# Patient Record
Sex: Female | Born: 1955 | Race: White | Hispanic: No | Marital: Married | Smoking: Former smoker
Health system: Southern US, Community
[De-identification: ages and names within clinical notes are randomized; demographics above are authoritative.]

## PROBLEM LIST (undated history)

## (undated) DIAGNOSIS — G4730 Sleep apnea, unspecified: Secondary | ICD-10-CM

## (undated) DIAGNOSIS — F32A Depression, unspecified: Secondary | ICD-10-CM

## (undated) DIAGNOSIS — M7071 Other bursitis of hip, right hip: Secondary | ICD-10-CM

## (undated) DIAGNOSIS — F329 Major depressive disorder, single episode, unspecified: Secondary | ICD-10-CM

## (undated) DIAGNOSIS — E079 Disorder of thyroid, unspecified: Secondary | ICD-10-CM

## (undated) DIAGNOSIS — M7072 Other bursitis of hip, left hip: Secondary | ICD-10-CM

## (undated) DIAGNOSIS — J45909 Unspecified asthma, uncomplicated: Secondary | ICD-10-CM

## (undated) DIAGNOSIS — A6920 Lyme disease, unspecified: Secondary | ICD-10-CM

## (undated) DIAGNOSIS — G4700 Insomnia, unspecified: Secondary | ICD-10-CM

## (undated) DIAGNOSIS — F419 Anxiety disorder, unspecified: Secondary | ICD-10-CM

## (undated) DIAGNOSIS — T7840XA Allergy, unspecified, initial encounter: Secondary | ICD-10-CM

## (undated) HISTORY — DX: Major depressive disorder, single episode, unspecified: F32.9

## (undated) HISTORY — DX: Allergy, unspecified, initial encounter: T78.40XA

## (undated) HISTORY — DX: Unspecified asthma, uncomplicated: J45.909

## (undated) HISTORY — DX: Insomnia, unspecified: G47.00

## (undated) HISTORY — DX: Depression, unspecified: F32.A

## (undated) HISTORY — DX: Disorder of thyroid, unspecified: E07.9

## (undated) HISTORY — DX: Other bursitis of hip, right hip: M70.72

## (undated) HISTORY — DX: Anxiety disorder, unspecified: F41.9

## (undated) HISTORY — DX: Lyme disease, unspecified: A69.20

## (undated) HISTORY — DX: Other bursitis of hip, right hip: M70.71

## (undated) HISTORY — DX: Sleep apnea, unspecified: G47.30

---

## 1997-06-21 HISTORY — PX: ABDOMINAL HYSTERECTOMY: SHX81

## 1997-06-21 HISTORY — PX: TOTAL ABDOMINAL HYSTERECTOMY: SHX209

## 2009-05-21 HISTORY — PX: SHOULDER SURGERY: SHX246

## 2013-08-24 LAB — HM MAMMOGRAPHY

## 2017-07-07 ENCOUNTER — Encounter: Payer: Self-pay | Admitting: *Deleted

## 2017-09-05 ENCOUNTER — Encounter: Payer: Self-pay | Admitting: Family Medicine

## 2017-09-05 ENCOUNTER — Ambulatory Visit (INDEPENDENT_AMBULATORY_CARE_PROVIDER_SITE_OTHER): Payer: Medicare HMO | Admitting: Family Medicine

## 2017-09-05 VITALS — BP 117/69 | HR 67 | Temp 98.4°F | Ht 64.0 in | Wt 157.4 lb

## 2017-09-05 DIAGNOSIS — Z7689 Persons encountering health services in other specified circumstances: Secondary | ICD-10-CM

## 2017-09-05 DIAGNOSIS — M7072 Other bursitis of hip, left hip: Secondary | ICD-10-CM

## 2017-09-05 DIAGNOSIS — G4700 Insomnia, unspecified: Secondary | ICD-10-CM | POA: Diagnosis not present

## 2017-09-05 DIAGNOSIS — E079 Disorder of thyroid, unspecified: Secondary | ICD-10-CM | POA: Diagnosis not present

## 2017-09-05 DIAGNOSIS — A6920 Lyme disease, unspecified: Secondary | ICD-10-CM

## 2017-09-05 DIAGNOSIS — M7071 Other bursitis of hip, right hip: Secondary | ICD-10-CM | POA: Diagnosis not present

## 2017-09-05 MED ORDER — PROGESTERONE MICRONIZED 100 MG PO CAPS: 100.0000 mg | ORAL_CAPSULE | Freq: Every day | ORAL | 6 refills | Status: DC

## 2017-09-05 MED ORDER — PROGESTERONE MICRONIZED 200 MG PO CAPS: 200.0000 mg | ORAL_CAPSULE | Freq: Every day | ORAL | 6 refills | Status: DC

## 2017-09-05 MED ORDER — DICLOFENAC SODIUM 50 MG PO TBEC: 50.0000 mg | DELAYED_RELEASE_TABLET | Freq: Two times a day (BID) | ORAL | 2 refills | Status: DC

## 2017-09-05 NOTE — Progress Notes (Signed)
BP 117/69 (BP Location: Left Arm, Patient Position: Sitting, Cuff Size: Normal)   Pulse 67   Temp 98.4 F (36.9 C) (Oral)   Ht 5\' 4"  (1.626 m)   Wt 157 lb 6.4 oz (71.4 kg)   SpO2 100%   BMI 27.02 kg/m    Subjective:    Patient ID: Veronica Mills, female    DOB: 1956-05-31, 62 y.o.   MRN: [id number]  HPI: Veronica FredericJoane Josey is a 62 y.o. female  Chief Complaint  Patient presents with  . Establish Care    From [place]. Needs PCP.  Marland Kitchen. Hip Pain    Bilateral, Hx of bursitis in hip   Pt here today to establish care. No new medical concerns today, just needing some refills on her medications.   Hx of B/l hip bursitis, has used diclofenac in the past for flares with good relief. Currently having a flare and would like a refill to help get things back under control. Intolerant to steroids/injections d/t chronic Lyme disease.   Has been stable on nature thyroid since 2012. TSH was last checked about 6 months ago she thinks.   Has severe night sweats, fairly controlled on estrogen and progesterone supplementation - taking 300 daily of progesterone orally as well as on two compounded creams - see below for formulation.   Compounded creams. Progesterone 6% cream and E2/E3 1.375/1.375 mg/mL. E twice daily 3 mL, progesterone 1 daily 5 mL.   Taking gabapentin for sleep issues/sweats along with some natural supplements and progesterone. 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   Unsure of when her last CPE was, will obtain records from [place] provider.   Past Medical History:  Diagnosis Date  . Bursitis of both hips   . Insomnia   . Lyme disease   . Thyroid disease    Social History   Socioeconomic History  . Marital status: Married    Spouse name: Not on file  . Number of children: Not on file  . Years of education: Not on file  . Highest education level: Not on file  Social Needs  . Financial resource strain: Not on file  . Food insecurity - worry: Not on file  . Food  insecurity - inability: Not on file  . Transportation needs - medical: Not on file  . Transportation needs - non-medical: Not on file  Occupational History  . Not on file  Tobacco Use  . Smoking status: Never Smoker  . Smokeless tobacco: Never Used  Substance and Sexual Activity  . Alcohol use: Yes    Frequency: Never    Comment: Occassionally  . Drug use: No  . Sexual activity: Not on file  Other Topics Concern  . Not on file  Social History Narrative  . Not on file   Relevant past medical, surgical, family and social history reviewed and updated as indicated. Interim medical history since our last visit reviewed. Allergies and medications reviewed and updated.  Review of Systems  Per HPI unless specifically indicated above     Objective:    BP 117/69 (BP Location: Left Arm, Patient Position: Sitting, Cuff Size: Normal)   Pulse 67   Temp 98.4 F (36.9 C) (Oral)   Ht 5\' 4"  (1.626 m)   Wt 157 lb 6.4 oz (71.4 kg)   SpO2 100%   BMI 27.02 kg/m   Wt Readings from Last 3 Encounters:  09/05/17 157 lb 6.4 oz (71.4 kg)    Physical  Exam  Constitutional: She is oriented to person, place, and time. She appears well-developed and well-nourished. No distress.  HENT:  Head: Atraumatic.  Eyes: Conjunctivae are normal. Pupils are equal, round, and reactive to light.  Neck: Normal range of motion. Neck supple.  Cardiovascular: Normal rate and normal heart sounds.  Pulmonary/Chest: Effort normal and breath sounds normal.  Musculoskeletal: Normal range of motion.  Neurological: She is alert and oriented to person, place, and time.  Skin: Skin is warm and dry.  Psychiatric: She has a normal mood and affect. Her behavior is normal.  Nursing note and vitals reviewed.   No results found for this or any previous visit.    Assessment & Plan:   Problem List Items Addressed This Visit      Endocrine   Thyroid disease    Will check TSH at upcoming appt. Continue Nature thyroid in  meantime      Relevant Medications   thyroid (NATURE-THROID) 32.5 MG tablet     Musculoskeletal and Integument   Bursitis of both hips    Restart diclofenac prn. Heat/ice as tolerated, stretches, epsom soaks.         Other   Insomnia - Primary    Stable on natural supplements, gabapentin, and compounded hormones. Refills given today      Lyme disease    Was followed by a Lyme Specialist in [place], feels things are at a stable point currently and not wanting to seek local specialist at this time.        Other Visit Diagnoses    Encounter to establish care           Follow up plan: Return for CPE.

## 2017-09-07 DIAGNOSIS — M7071 Other bursitis of hip, right hip: Secondary | ICD-10-CM | POA: Insufficient documentation

## 2017-09-07 DIAGNOSIS — E039 Hypothyroidism, unspecified: Secondary | ICD-10-CM | POA: Insufficient documentation

## 2017-09-07 DIAGNOSIS — F5104 Psychophysiologic insomnia: Secondary | ICD-10-CM | POA: Insufficient documentation

## 2017-09-07 DIAGNOSIS — A6920 Lyme disease, unspecified: Secondary | ICD-10-CM | POA: Insufficient documentation

## 2017-09-07 DIAGNOSIS — M7072 Other bursitis of hip, left hip: Secondary | ICD-10-CM

## 2017-09-07 NOTE — Assessment & Plan Note (Signed)
Was followed by a Lyme Specialist in [place], feels things are at a stable point currently and not wanting to seek local specialist at this time.

## 2017-09-07 NOTE — Assessment & Plan Note (Signed)
Will check TSH at upcoming appt. Continue Nature thyroid in meantime

## 2017-09-07 NOTE — Assessment & Plan Note (Signed)
Restart diclofenac prn. Heat/ice as tolerated, stretches, epsom soaks.

## 2017-09-07 NOTE — Patient Instructions (Signed)
Follow up for CPE 

## 2017-09-07 NOTE — Assessment & Plan Note (Signed)
Stable on natural supplements, gabapentin, and compounded hormones. Refills given today

## 2017-09-14 ENCOUNTER — Encounter: Payer: Medicare HMO | Admitting: Family Medicine

## 2017-10-12 ENCOUNTER — Telehealth: Payer: Self-pay | Admitting: Family Medicine

## 2017-10-12 NOTE — Telephone Encounter (Signed)
Patient notified

## 2017-10-12 NOTE — Telephone Encounter (Signed)
Yes, they have been received  Copied from CRM [phone]. Topic: Inquiry >> Oct 11, 2017  3:15 PM Maia Pettiesrtiz, Kristie S wrote: Reason for CRM: pt calling to get status update on records. She states the records from Dr. [place] ServiceJimmy Kline were supposed to be sent. Have these been received? Pt has appointment Thursday. Please call her to notify. >> Oct 12, 2017 10:00 AM Adela PortsWilliamson, Christan M wrote: Have you seen these records?

## 2017-10-13 ENCOUNTER — Encounter: Payer: Self-pay | Admitting: Family Medicine

## 2017-10-13 ENCOUNTER — Ambulatory Visit (INDEPENDENT_AMBULATORY_CARE_PROVIDER_SITE_OTHER): Payer: Medicare HMO | Admitting: Family Medicine

## 2017-10-13 VITALS — BP 120/65 | HR 70 | Temp 97.8°F | Ht 64.0 in | Wt 162.8 lb

## 2017-10-13 DIAGNOSIS — Z1159 Encounter for screening for other viral diseases: Secondary | ICD-10-CM

## 2017-10-13 DIAGNOSIS — A6920 Lyme disease, unspecified: Secondary | ICD-10-CM

## 2017-10-13 DIAGNOSIS — G4700 Insomnia, unspecified: Secondary | ICD-10-CM

## 2017-10-13 DIAGNOSIS — Z8639 Personal history of other endocrine, nutritional and metabolic disease: Secondary | ICD-10-CM

## 2017-10-13 DIAGNOSIS — Z1211 Encounter for screening for malignant neoplasm of colon: Secondary | ICD-10-CM

## 2017-10-13 DIAGNOSIS — Z0000 Encounter for general adult medical examination without abnormal findings: Secondary | ICD-10-CM

## 2017-10-13 DIAGNOSIS — Z13220 Encounter for screening for lipoid disorders: Secondary | ICD-10-CM | POA: Diagnosis not present

## 2017-10-13 DIAGNOSIS — Z131 Encounter for screening for diabetes mellitus: Secondary | ICD-10-CM | POA: Diagnosis not present

## 2017-10-13 DIAGNOSIS — Z0001 Encounter for general adult medical examination with abnormal findings: Secondary | ICD-10-CM | POA: Diagnosis not present

## 2017-10-13 DIAGNOSIS — E079 Disorder of thyroid, unspecified: Secondary | ICD-10-CM | POA: Diagnosis not present

## 2017-10-13 LAB — UA/M W/RFLX CULTURE, ROUTINE
BILIRUBIN UA: NEGATIVE
GLUCOSE, UA: NEGATIVE
KETONES UA: NEGATIVE
NITRITE UA: NEGATIVE
Protein, UA: NEGATIVE
RBC UA: NEGATIVE
SPEC GRAV UA: 1.02 (ref 1.005–1.030)
Urobilinogen, Ur: 0.2 mg/dL (ref 0.2–1.0)
pH, UA: 6.5 (ref 5.0–7.5)

## 2017-10-13 LAB — MICROSCOPIC EXAMINATION: Bacteria, UA: NONE SEEN

## 2017-10-13 MED ORDER — POLYMYXIN B-TRIMETHOPRIM 10000-0.1 UNIT/ML-% OP SOLN: 1.0000 [drp] | OPHTHALMIC | 0 refills | Status: DC

## 2017-10-13 NOTE — Assessment & Plan Note (Signed)
Still followed by Lyme specialist in [place]. Has upcoming appt in May

## 2017-10-13 NOTE — Progress Notes (Signed)
Subjective:    Veronica Mills is a 62 y.o. female who presents for a Welcome to Medicare exam.   Review of Systems Review of Systems  Constitutional: Negative.   HENT: Negative.   Eyes: Negative.   Respiratory: Negative.   Cardiovascular: Negative.   Gastrointestinal: Negative.   Genitourinary: Negative.   Musculoskeletal: Negative.   Skin: Negative.   Neurological: Negative.   Psychiatric/Behavioral: Positive for depression.      Left eye draining and itching, happens cyclicly. No decreased vision, fevers, headaches, N/V.   Compounding pharmacy in [place] for nature thyroid - Programme researcher, broadcasting/film/video of cary. Can only have this type due to a corn allergy.  Seeing Lyme dz specialist next month in [place].   Hx of hysterectomy, no cervix confirmed at last pap several years ago. Does not want mammogram, wants cologuard.       Objective:    Today's Vitals   10/13/17 1333  BP: 120/65  Pulse: 70  Temp: 97.8 F (36.6 C)  TempSrc: Oral  SpO2: 99%  Weight: 162 lb 12.8 oz (73.8 kg)  Height: 5\' 4"  (1.626 m)  Body mass index is 27.94 kg/m.  Medications Outpatient Encounter Medications as of 10/13/2017  Medication Sig  . diclofenac (VOLTAREN) 50 MG EC tablet Take 1 tablet (50 mg total) by mouth 2 (two) times daily.  . Estradiol-Estriol-Progesterone (BIEST/PROGESTERONE TD) Apply topically.  . gabapentin (NEURONTIN) 600 MG tablet Take 1,200 mg by mouth at bedtime.  . Misc Natural Products (PROGESTERONE EX) Apply 6 % topically.  . progesterone (PROMETRIUM) 100 MG capsule Take 1 capsule (100 mg total) by mouth daily.  . progesterone (PROMETRIUM) 200 MG capsule Take 1 capsule (200 mg total) by mouth daily.  . [DISCONTINUED] gabapentin (NEURONTIN) 300 MG capsule Take 600 mg by mouth daily.  . [DISCONTINUED] thyroid (NATURE-THROID) 32.5 MG tablet Take 32.5 mg by mouth daily.  Marland Kitchen trimethoprim-polymyxin b (POLYTRIM) ophthalmic solution Place 1 drop into the left eye every 4 (four)  hours.   No facility-administered encounter medications on file as of 10/13/2017.      History: Past Medical History:  Diagnosis Date  . Bursitis of both hips   . Insomnia   . Lyme disease   . Thyroid disease    Past Surgical History:  Procedure Laterality Date  . ABDOMINAL HYSTERECTOMY  06/21/1997  . SHOULDER SURGERY Left 05/2009    Family History  Problem Relation Age of Onset  . Mental retardation Mother   . Stroke Father   . Dementia Father    Social History   Occupational History  . Not on file  Tobacco Use  . Smoking status: Never Smoker  . Smokeless tobacco: Never Used  Substance and Sexual Activity  . Alcohol use: Yes    Frequency: Never    Comment: Occassionally  . Drug use: No  . Sexual activity: Not on file    Tobacco Counseling Counseling given: Not Answered   Immunizations and Health Maintenance  There is no immunization history on file for this patient. There are no preventive care reminders to display for this patient.  Activities of Daily Living In your present state of health, do you have any difficulty performing the following activities: 10/13/2017  Hearing? N  Vision? N  Difficulty concentrating or making decisions? N  Walking or climbing stairs? Y  Comment Sometimes-Hips  Dressing or bathing? N  Doing errands, shopping? N    Physical Exam  Physical Exam  Constitutional: She is oriented to person, place,  and time and well-developed, well-nourished, and in no distress. No distress.  HENT:  Head: Atraumatic.  Right Ear: External ear normal.  Left Ear: External ear normal.  Nose: Nose normal.  Mouth/Throat: Oropharynx is clear and moist.  Eyes: Pupils are equal, round, and reactive to light. Conjunctivae are normal.  Neck: Normal range of motion. Neck supple.  Cardiovascular: Normal rate, regular rhythm and normal heart sounds.  Pulmonary/Chest: Effort normal and breath sounds normal. No respiratory distress. She has no wheezes.    Abdominal: Soft. Bowel sounds are normal. There is no tenderness.  Musculoskeletal: Normal range of motion.  Neurological: She is alert and oriented to person, place, and time. No cranial nerve deficit.  Skin: Skin is warm and dry.  Psychiatric: Affect and judgment normal.  Nursing note and vitals reviewed. (optional), or other factors deemed appropriate based on the beneficiary's medical and social history and current clinical standards.  Advanced Directives: A voluntary discussion about advance care planning including the explanation and discussion of advance directives was extensively discussed  with the patient.  Explanation about the health care proxy and Living will was reviewed and packet with forms with explanation of how to fill them out was given.  During this discussion, the patient does not plan to fill out the paperwork required today but will consider what her decisions would be.  Patient was offered a separate Advance Care Planning visit for further assistance with forms.  Time spent:   5 min     Individuals present: pt, provider       Assessment:    This is a routine wellness examination for this patient .   Vision/Hearing screen  Hearing Screening   125Hz  250Hz  500Hz  1000Hz  2000Hz  3000Hz  4000Hz  6000Hz  8000Hz   Right ear:     40  40    Left ear:   25 25 20  25       Visual Acuity Screening   Right eye Left eye Both eyes  Without correction:     With correction: 20/30 20/20 20/20     Dietary issues and exercise activities discussed:     Goals    None     Depression Screen PHQ 2/9 Scores 10/13/2017  PHQ - 2 Score 0      Fall Risk Fall Risk  10/13/2017  Falls in the past year? No    Cognitive Function: 6CIT Screen 10/16/2017  What Year? 0 points  What month? 0 points  What time? 0 points  Count back from 20 0 points  Months in reverse 0 points  Repeat phrase 0 points  Total Score 0          Patient Care Team: Particia Nearing, PA-C as PCP -  General (Family Medicine)     Plan:    1. Encounter for Medicare annual wellness exam EKG today NSR at 64 bpm, await basic labs and screening results - EKG 12-Lead - Hepatitis C antibody screen - Lipid panel - Hemoglobin A1c - Basic metabolic panel - UA/M w/rflx Culture, Routine  2. Thyroid disease Recheck TSH, order compounded nature thyroid - TSH  3. Need for hepatitis C screening test Await results  4. Screening for colon cancer Cologuard ordered - Cologuard  5. Lyme disease Sees Lyme specialist, defer to their tx  6. Insomnia, unspecified type Stable on gabapentin and HRT  7. History of thyroid nodule Will repeat u/s per pt request, she will send previous imaging records - US THYROID; Future  I have personally reviewed  and noted the following in the patient's chart:   . Medical and social history . Use of alcohol, tobacco or illicit drugs  . Current medications and supplements . Functional ability and status . Nutritional status . Physical activity . Advanced directives . List of other physicians . Hospitalizations, surgeries, and ER visits in previous 12 months . Vitals . Screenings to include cognitive, depression, and falls . Referrals and appointments  In addition, I have reviewed and discussed with patient certain preventive protocols, quality metrics, and best practice recommendations. A written personalized care plan for preventive services as well as general preventive health recommendations were provided to patient.     Particia NearingRachel Elizabeth Doninique Lwin, [place] JerseyPA-C 10/16/2017

## 2017-10-13 NOTE — Assessment & Plan Note (Signed)
Recheck TSH today, continue nature thyroid. Will adjust dose if needed

## 2017-10-13 NOTE — Patient Instructions (Addendum)
[facility] in [facility] Maintenance, Female Adopting a healthy lifestyle and getting preventive care can go a long way to promote health and wellness. Talk with your health care provider about what schedule of regular examinations is right for you. This is a good chance for you to check in with your provider about disease prevention and staying healthy. In between checkups, there are plenty of things you can do on your own. Experts have done a lot of research about which lifestyle changes and preventive measures are most likely to keep you healthy. Ask your health care provider for more information. Weight and diet Eat a healthy diet  Be sure to include plenty of vegetables, fruits, low-fat dairy products, and lean protein.  Do not eat a lot of foods high in solid fats, added sugars, or salt.  Get regular exercise. This is one of the most important things you can do for your health. ? Most adults should exercise for at least 150 minutes each week. The exercise should increase your heart rate and make you sweat (moderate-intensity exercise). ? Most adults should also do strengthening exercises at least twice a week. This is in addition to the moderate-intensity exercise.  Maintain a healthy weight  Body mass index (BMI) is a measurement that can be used to identify possible weight problems. It estimates body fat based on height and weight. Your health care provider can help determine your BMI and help you achieve or maintain a healthy weight.  For females 92 years of age and older: ? A BMI below 18.5 is considered underweight. ? A BMI of 18.5 to 24.9 is normal. ? A BMI of 25 to 29.9 is considered overweight. ? A BMI of 30 and above is considered obese.  Watch levels of cholesterol and blood lipids  You should start having your blood tested for lipids and cholesterol at 60 years of age, then have this test every 5 years.  You may need to have your cholesterol levels  checked more often if: ? Your lipid or cholesterol levels are high. ? You are older than 60 years of age. ? You are at high risk for heart disease.  Cancer screening Lung Cancer  Lung cancer screening is recommended for adults 4-27 years old who are at high risk for lung cancer because of a history of smoking.  A yearly low-dose CT scan of the lungs is recommended for people who: ? Currently smoke. ? Have quit within the past 15 years. ? Have at least a 30-pack-year history of smoking. A pack year is smoking an average of one pack of cigarettes a day for 1 year.  Yearly screening should continue until it has been 15 years since you quit.  Yearly screening should stop if you develop a health problem that would prevent you from having lung cancer treatment.  Breast Cancer  Practice breast self-awareness. This means understanding how your breasts normally appear and feel.  It also means doing regular breast self-exams. Let your health care provider know about any changes, no matter how small.  If you are in your 20s or 30s, you should have a clinical breast exam (CBE) by a health care provider every 1-3 years as part of a regular health exam.  If you are 67 or older, have a CBE every year. Also consider having a breast X-ray (mammogram) every year.  If you have a family history of breast cancer, talk to your health care provider about genetic  screening.  If you are at high risk for breast cancer, talk to your health care provider about having an MRI and a mammogram every year.  Breast cancer gene (BRCA) assessment is recommended for women who have family members with BRCA-related cancers. BRCA-related cancers include: ? Breast. ? Ovarian. ? Tubal. ? Peritoneal cancers.  Results of the assessment will determine the need for genetic counseling and BRCA1 and BRCA2 testing.  Cervical Cancer Your health care provider may recommend that you be screened regularly for cancer of the  pelvic organs (ovaries, uterus, and vagina). This screening involves a pelvic examination, including checking for microscopic changes to the surface of your cervix (Pap test). You may be encouraged to have this screening done every 3 years, beginning at age 21.  For women ages 30-65, health care providers may recommend pelvic exams and Pap testing every 3 years, or they may recommend the Pap and pelvic exam, combined with testing for human papilloma virus (HPV), every 5 years. Some types of HPV increase your risk of cervical cancer. Testing for HPV may also be done on women of any age with unclear Pap test results.  Other health care providers may not recommend any screening for nonpregnant women who are considered low risk for pelvic cancer and who do not have symptoms. Ask your health care provider if a screening pelvic exam is right for you.  If you have had past treatment for cervical cancer or a condition that could lead to cancer, you need Pap tests and screening for cancer for at least 20 years after your treatment. If Pap tests have been discontinued, your risk factors (such as having a new sexual partner) need to be reassessed to determine if screening should resume. Some women have medical problems that increase the chance of getting cervical cancer. In these cases, your health care provider may recommend more frequent screening and Pap tests.  Colorectal Cancer  This type of cancer can be detected and often prevented.  Routine colorectal cancer screening usually begins at 60 years of age and continues through 60 years of age.  Your health care provider may recommend screening at an earlier age if you have risk factors for colon cancer.  Your health care provider may also recommend using home test kits to check for hidden blood in the stool.  A small camera at the end of a tube can be used to examine your colon directly (sigmoidoscopy or colonoscopy). This is done to check for the  earliest forms of colorectal cancer.  Routine screening usually begins at age 50.  Direct examination of the colon should be repeated every 5-10 years through 60 years of age. However, you may need to be screened more often if early forms of precancerous polyps or small growths are found.  Skin Cancer  Check your skin from head to toe regularly.  Tell your health care provider about any new moles or changes in moles, especially if there is a change in a mole's shape or color.  Also tell your health care provider if you have a mole that is larger than the size of a pencil eraser.  Always use sunscreen. Apply sunscreen liberally and repeatedly throughout the day.  Protect yourself by wearing long sleeves, pants, a wide-brimmed hat, and sunglasses whenever you are outside.  Heart disease, diabetes, and high blood pressure  High blood pressure causes heart disease and increases the risk of stroke. High blood pressure is more likely to develop in: ? People who   have blood pressure in the high end of the normal range (130-139/85-89 mm Hg). ? People who are overweight or obese. ? People who are African American.  If you are 19-29 years of age, have your blood pressure checked every 3-5 years. If you are 30 years of age or older, have your blood pressure checked every year. You should have your blood pressure measured twice-once when you are at a hospital or clinic, and once when you are not at a hospital or clinic. Record the average of the two measurements. To check your blood pressure when you are not at a hospital or clinic, you can use: ? An automated blood pressure machine at a pharmacy. ? A home blood pressure monitor.  If you are between 65 years and 63 years old, ask your health care provider if you should take aspirin to prevent strokes.  Have regular diabetes screenings. This involves taking a blood sample to check your fasting blood sugar level. ? If you are at a normal weight and  have a low risk for diabetes, have this test once every three years after 60 years of age. ? If you are overweight and have a high risk for diabetes, consider being tested at a younger age or more often. Preventing infection Hepatitis B  If you have a higher risk for hepatitis B, you should be screened for this virus. You are considered at high risk for hepatitis B if: ? You were born in a country where hepatitis B is common. Ask your health care provider which countries are considered high risk. ? Your parents were born in a high-risk country, and you have not been immunized against hepatitis B (hepatitis B vaccine). ? You have HIV or AIDS. ? You use needles to inject street drugs. ? You live with someone who has hepatitis B. ? You have had sex with someone who has hepatitis B. ? You get hemodialysis treatment. ? You take certain medicines for conditions, including cancer, organ transplantation, and autoimmune conditions.  Hepatitis C  Blood testing is recommended for: ? Everyone born from [birth year] through 1963. ? Anyone with known risk factors for hepatitis C.  Sexually transmitted infections (STIs)  You should be screened for sexually transmitted infections (STIs) including gonorrhea and chlamydia if: ? You are sexually active and are younger than 60 years of age. ? You are older than 60 years of age and your health care provider tells you that you are at risk for this type of infection. ? Your sexual activity has changed since you were last screened and you are at an increased risk for chlamydia or gonorrhea. Ask your health care provider if you are at risk.  If you do not have HIV, but are at risk, it may be recommended that you take a prescription medicine daily to prevent HIV infection. This is called pre-exposure prophylaxis (PrEP). You are considered at risk if: ? You are sexually active and do not regularly use condoms or know the HIV status of your partner(s). ? You take drugs by  injection. ? You are sexually active with a partner who has HIV.  Talk with your health care provider about whether you are at high risk of being infected with HIV. If you choose to begin PrEP, you should first be tested for HIV. You should then be tested every 3 months for as long as you are taking PrEP. Pregnancy  If you are premenopausal and you may become pregnant, ask your health care provider  about preconception counseling.  If you may become pregnant, take 400 to 800 micrograms (mcg) of folic acid every day.  If you want to prevent pregnancy, talk to your health care provider about birth control (contraception). Osteoporosis and menopause  Osteoporosis is a disease in which the bones lose minerals and strength with aging. This can result in serious bone fractures. Your risk for osteoporosis can be identified using a bone density scan.  If you are 6 years of age or older, or if you are at risk for osteoporosis and fractures, ask your health care provider if you should be screened.  Ask your health care provider whether you should take a calcium or vitamin D supplement to lower your risk for osteoporosis.  Menopause may have certain physical symptoms and risks.  Hormone replacement therapy may reduce some of these symptoms and risks. Talk to your health care provider about whether hormone replacement therapy is right for you. Follow these instructions at home:  Schedule regular health, dental, and eye exams.  Stay current with your immunizations.  Do not use any tobacco products including cigarettes, chewing tobacco, or electronic cigarettes.  If you are pregnant, do not drink alcohol.  If you are breastfeeding, limit how much and how often you drink alcohol.  Limit alcohol intake to no more than 1 drink per day for nonpregnant women. One drink equals 12 ounces of beer, 5 ounces of wine, or 1 ounces of hard liquor.  Do not use street drugs.  Do not share needles.  Ask  your health care provider for help if you need support or information about quitting drugs.  Tell your health care provider if you often feel depressed.  Tell your health care provider if you have ever been abused or do not feel safe at home. This information is not intended to replace advice given to you by your health care provider. Make sure you discuss any questions you have with your health care provider. Document Released: 12/21/2010 Document Revised: 11/13/2015 Document Reviewed: 03/11/2015 Elsevier Interactive Patient Education  [employer].

## 2017-10-13 NOTE — Assessment & Plan Note (Signed)
Under good control with gabapentin and hormone replacement. Continue current regimen

## 2017-10-14 ENCOUNTER — Telehealth: Payer: Self-pay | Admitting: Family Medicine

## 2017-10-14 ENCOUNTER — Other Ambulatory Visit: Payer: Self-pay | Admitting: Family Medicine

## 2017-10-14 DIAGNOSIS — R5381 Other malaise: Secondary | ICD-10-CM | POA: Diagnosis not present

## 2017-10-14 DIAGNOSIS — M2550 Pain in unspecified joint: Secondary | ICD-10-CM | POA: Diagnosis not present

## 2017-10-14 DIAGNOSIS — G3189 Other specified degenerative diseases of nervous system: Secondary | ICD-10-CM | POA: Diagnosis not present

## 2017-10-14 DIAGNOSIS — A6920 Lyme disease, unspecified: Secondary | ICD-10-CM | POA: Diagnosis not present

## 2017-10-14 LAB — BASIC METABOLIC PANEL
BUN / CREAT RATIO: 32 — AB (ref 9–23)
BUN: 19 mg/dL (ref 6–24)
CO2: 26 mmol/L (ref 20–29)
CREATININE: 0.6 mg/dL (ref 0.57–1.00)
Calcium: 9.8 mg/dL (ref 8.7–10.2)
Chloride: 95 mmol/L — ABNORMAL LOW (ref 96–106)
GFR calc Af Amer: 115 mL/min/{1.73_m2} (ref >59)
GFR, EST NON AFRICAN AMERICAN: 100 mL/min/{1.73_m2} (ref >59)
GLUCOSE: 126 mg/dL — AB (ref 65–99)
Potassium: 3.8 mmol/L (ref 3.5–5.2)
Sodium: 141 mmol/L (ref 134–144)

## 2017-10-14 LAB — HEPATITIS C ANTIBODY: Hep C Virus Ab: <0.1 s/co ratio (ref 0.0–0.9)

## 2017-10-14 LAB — HEMOGLOBIN A1C
Est. average glucose Bld gHb Est-mCnc: 117 mg/dL
Hgb A1c MFr Bld: 5.7 % — ABNORMAL HIGH (ref 4.8–5.6)

## 2017-10-14 LAB — LIPID PANEL
CHOLESTEROL TOTAL: 222 mg/dL — AB (ref 100–199)
Chol/HDL Ratio: 4.1 ratio (ref 0.0–4.4)
HDL: 54 mg/dL (ref >39)
LDL CALC: 93 mg/dL (ref 0–99)
TRIGLYCERIDES: 375 mg/dL — AB (ref 0–149)
VLDL Cholesterol Cal: 75 mg/dL — ABNORMAL HIGH (ref 5–40)

## 2017-10-14 LAB — TSH: TSH: 1.56 u[IU]/mL (ref 0.450–4.500)

## 2017-10-14 MED ORDER — THYROID 32.5 MG PO TABS: 32.5000 mg | ORAL_TABLET | Freq: Every day | ORAL | 3 refills | Status: DC

## 2017-10-14 NOTE — Telephone Encounter (Signed)
Nature thyroid script printed, can you please fax it to [facility] in [place], [place] per pt request?

## 2017-10-14 NOTE — Telephone Encounter (Signed)
Rx faxed

## 2017-10-18 ENCOUNTER — Ambulatory Visit: Payer: Medicare HMO

## 2017-10-20 ENCOUNTER — Ambulatory Visit
Admission: RE | Admit: 2017-10-20 | Discharge: 2017-10-20 | Disposition: A | Discharge status: Home / Self Care | Payer: Medicare HMO | Source: Ambulatory Visit | Attending: Family Medicine | Admitting: Family Medicine

## 2017-10-20 DIAGNOSIS — E041 Nontoxic single thyroid nodule: Secondary | ICD-10-CM | POA: Diagnosis not present

## 2017-10-20 DIAGNOSIS — Z8639 Personal history of other endocrine, nutritional and metabolic disease: Secondary | ICD-10-CM

## 2017-11-11 ENCOUNTER — Other Ambulatory Visit: Payer: Self-pay | Admitting: Family Medicine

## 2017-11-25 ENCOUNTER — Telehealth: Payer: Self-pay | Admitting: Family Medicine

## 2017-11-25 NOTE — Telephone Encounter (Signed)
Copied from CRM [phone]. Topic: General - Other >> Nov 25, 2017  3:00 PM Gerrianne ScalePayne, Angela L wrote: Reason for CRM: Judeth CornfieldStephanie from [facility] [phone] calling stating that the pt states that she is suppose to take two of the gabapentin (NEURONTIN) 600 MG tablet please give them a call back

## 2017-11-28 NOTE — Telephone Encounter (Signed)
Pharmacy stated that patient brought in written Rx by Fleet Contrasachel for 2 600mg  Gabapentin QHS. Pharmacy was confirming that it was 1200 at night. Explained we didn't have the Rx on file for 600mg  [facility], but we have 1200 in our system. Pharmacy understood and said they'd fill the 1200 at night.

## 2017-11-28 NOTE — Telephone Encounter (Signed)
Perfect. Thanks.

## 2017-11-28 NOTE — Telephone Encounter (Signed)
It doesn't look like we have written her gabapentin. It appears that she told us she was taking 1200mg  at bedtime

## 2017-12-02 ENCOUNTER — Encounter: Payer: Self-pay | Admitting: Family Medicine

## 2017-12-02 DIAGNOSIS — H1032 Unspecified acute conjunctivitis, left eye: Secondary | ICD-10-CM | POA: Diagnosis not present

## 2017-12-02 LAB — HM DIABETES EYE EXAM

## 2017-12-14 ENCOUNTER — Other Ambulatory Visit: Payer: Self-pay | Admitting: Family Medicine

## 2017-12-16 NOTE — Telephone Encounter (Signed)
Interface refill request  For Voltaren  # 60.  50mg .    Pharmacy:  [facility] [address]  LOV 10/13/17 with [facility]ane[facility]   Last refill:11/15/17

## 2017-12-29 ENCOUNTER — Other Ambulatory Visit: Payer: Self-pay

## 2017-12-29 NOTE — Telephone Encounter (Signed)
90 day supply request for Diclofenac Tablet.  Last visit 10/03/2017

## 2017-12-30 MED ORDER — DICLOFENAC SODIUM 50 MG PO TBEC: 50.0000 mg | DELAYED_RELEASE_TABLET | Freq: Two times a day (BID) | ORAL | 1 refills | Status: DC

## 2017-12-30 NOTE — Telephone Encounter (Signed)
RX sent

## 2018-01-30 ENCOUNTER — Encounter: Payer: Self-pay | Admitting: Family Medicine

## 2018-01-30 ENCOUNTER — Ambulatory Visit (INDEPENDENT_AMBULATORY_CARE_PROVIDER_SITE_OTHER): Payer: Medicare HMO | Admitting: Family Medicine

## 2018-01-30 VITALS — BP 132/69 | HR 60 | Temp 98.1°F | Ht 64.0 in | Wt 164.3 lb

## 2018-01-30 DIAGNOSIS — H1032 Unspecified acute conjunctivitis, left eye: Secondary | ICD-10-CM | POA: Diagnosis not present

## 2018-01-30 MED ORDER — SULFAMETHOXAZOLE-TRIMETHOPRIM 800-160 MG PO TABS: 1.0000 | ORAL_TABLET | Freq: Two times a day (BID) | ORAL | 0 refills | Status: DC

## 2018-01-30 NOTE — Progress Notes (Signed)
   BP 132/69 (BP Location: Left Arm, Patient Position: Sitting, Cuff Size: Normal)   Pulse 60   Temp 98.1 F (36.7 C) (Oral)   Ht 5\' 4"  (1.626 m)   Wt 164 lb 4.8 oz (74.5 kg)   SpO2 98%   BMI 28.20 kg/m    Subjective:    Patient ID: Veronica Mills, female    DOB: [birth year], 62 y.o.   MRN: [id number]  HPI: Veronica Mills is a 62 y.o. female  Chief Complaint  Patient presents with  . Eye Drainage    Left eye. Burning, itching and redness. Ongoing for 4 days. No relief with eye drops.   4 days of left eye irritation, drainage, redness, itching. Doing polytrim drops and antihistamines, compresses with no relief. Now also 2 days of surrounding edema and erythema of under eye area. Denies fevers, chills, visual changes, headaches, N/V. Has had eye issues like this intermittently over the years but this one feels a bit worse than usual.   Relevant past medical, surgical, family and social history reviewed and updated as indicated. Interim medical history since our last visit reviewed. Allergies and medications reviewed and updated.  Review of Systems  Per HPI unless specifically indicated above     Objective:    BP 132/69 (BP Location: Left Arm, Patient Position: Sitting, Cuff Size: Normal)   Pulse 60   Temp 98.1 F (36.7 C) (Oral)   Ht 5\' 4"  (1.626 m)   Wt 164 lb 4.8 oz (74.5 kg)   SpO2 98%   BMI 28.20 kg/m   Wt Readings from Last 3 Encounters:  01/30/18 164 lb 4.8 oz (74.5 kg)  10/13/17 162 lb 12.8 oz (73.8 kg)  09/05/17 157 lb 6.4 oz (71.4 kg)    Physical Exam  Constitutional: She is oriented to person, place, and time. She appears well-developed and well-nourished. No distress.  HENT:  Head: Atraumatic.  Eyes: Pupils are equal, round, and reactive to light. EOM are normal. Left eye exhibits discharge.  Left conjunctiva erythematous with thick drainage and surrounding erythema starting under eye. TTP over erythematous area  Neck: Normal range of motion. Neck supple.    Cardiovascular: Normal rate and regular rhythm.  Pulmonary/Chest: Effort normal and breath sounds normal.  Neurological: She is alert and oriented to person, place, and time.  Skin: Skin is warm and dry.  Psychiatric: She has a normal mood and affect. Her behavior is normal.  Nursing note and vitals reviewed.  Declines visual acuity testing as she states her vision appears intact  Results for orders placed or performed in visit on 12/05/17  HM DIABETES EYE EXAM  Result Value Ref Range   HM Diabetic Eye Exam No Retinopathy No Retinopathy      Assessment & Plan:   Problem List Items Addressed This Visit    None    Visit Diagnoses    Acute bacterial conjunctivitis of left eye    -  Primary   Will add oral bactrim and continue polytrim drops. Compresses, OTC pain relievers. Strict return precautions reviewed   Relevant Medications   sulfamethoxazole-trimethoprim (BACTRIM DS,SEPTRA DS) 800-160 MG tablet       Follow up plan: Return for as scheduled.

## 2018-02-02 ENCOUNTER — Other Ambulatory Visit: Payer: Self-pay | Admitting: Family Medicine

## 2018-02-02 NOTE — Telephone Encounter (Signed)
Correction: Last office visit 01/30/2018 for an acute visit.

## 2018-02-02 NOTE — Patient Instructions (Signed)
Follow up as scheduled.  

## 2018-02-02 NOTE — Telephone Encounter (Signed)
Progesterone 200 mg capsule refill Last Refill:09/05/17 # 30 Last OV: 09/05/17 PCP: Roosvelt Maserachel Lane PA [facility]acy:[facility] [address]   Progesterone 100 mg refill Last Refill:09/05/17 # 30 Last OV: 09/05/17

## 2018-02-03 DIAGNOSIS — H10503 Unspecified blepharoconjunctivitis, bilateral: Secondary | ICD-10-CM | POA: Diagnosis not present

## 2018-07-25 ENCOUNTER — Encounter: Payer: Self-pay | Admitting: Family Medicine

## 2018-07-25 ENCOUNTER — Ambulatory Visit (INDEPENDENT_AMBULATORY_CARE_PROVIDER_SITE_OTHER): Payer: Medicare HMO | Admitting: Family Medicine

## 2018-07-25 VITALS — BP 117/76 | HR 71 | Temp 99.0°F | Ht 64.0 in | Wt 170.1 lb

## 2018-07-25 DIAGNOSIS — E785 Hyperlipidemia, unspecified: Secondary | ICD-10-CM | POA: Diagnosis not present

## 2018-07-25 DIAGNOSIS — A6920 Lyme disease, unspecified: Secondary | ICD-10-CM

## 2018-07-25 DIAGNOSIS — R7301 Impaired fasting glucose: Secondary | ICD-10-CM | POA: Diagnosis not present

## 2018-07-25 DIAGNOSIS — M2550 Pain in unspecified joint: Secondary | ICD-10-CM | POA: Diagnosis not present

## 2018-07-25 DIAGNOSIS — E039 Hypothyroidism, unspecified: Secondary | ICD-10-CM

## 2018-07-25 DIAGNOSIS — R5383 Other fatigue: Secondary | ICD-10-CM | POA: Diagnosis not present

## 2018-07-25 NOTE — Progress Notes (Signed)
BP 117/76 (BP Location: Left Arm, Patient Position: Sitting, Cuff Size: Normal)   Pulse 71   Temp 99 F (37.2 C) (Oral)   Ht _0  (1.626 m)   Wt 170 lb 1.6 oz (77.2 kg)   SpO2 98%   BMI 29.20 kg/m    Subjective:    Patient ID: Veronica Mills, female    DOB: 09-Aug-1955, 63 y.o.   MRN: [id number]  HPI: Veronica Mills is a 63 y.o. female  Chief Complaint  Patient presents with  . Weight Gain    Ongoing for about 3 months. Patient is wondering if it has to do with her thyroid.   . Muscle Pain  . Knee Pain  . Fatigue  . Headache  . Depression   Weight gain, fatigue, muscle pains, joint pains in b/l knees, lack of motivation, insomnia, headaches at temples, night sweats. No fevers, CP, SOB, N/V/D. Does have chronic lyme disease. Seeing Lyme specialist as needed. No recent travel outside the country, hx of autoimmune dz, tick bites, sick contacts, bleeding or bruising issues. Does feel like her moods are becoming more of an issue and wondering if this is playing a part. Does have known hypothyroidism that has been well controled with nature thyroid for years.   Moods - Was on cymbalta in the past and had a really hard time coming off of that and does not want to be on any at this time. Has seen her daughter struggle with them as well. Denies SI/HI.   Also due for cholesterol and glucose rechecks. Very concerned about these levels and her recent weight gain despite not making much change to lifestyle. Currently diet controlled for both.   Past Medical History:  Diagnosis Date  . Bursitis of both hips   . Insomnia   . Lyme disease   . Thyroid disease    Social History   Socioeconomic History  . Marital status: Married    Spouse name: Not on file  . Number of children: Not on file  . Years of education: Not on file  . Highest education level: Not on file  Occupational History  . Not on file  Social Needs  . Financial resource strain: Not on file  . Food insecurity:   Worry: Not on file    Inability: Not on file  . Transportation needs:    Medical: Not on file    Non-medical: Not on file  Tobacco Use  . Smoking status: Never Smoker  . Smokeless tobacco: Never Used  Substance and Sexual Activity  . Alcohol use: Yes    Frequency: Never    Comment: Occassionally  . Drug use: No  . Sexual activity: Not on file  Lifestyle  . Physical activity:    Days per week: Not on file    Minutes per session: Not on file  . Stress: Not on file  Relationships  . Social connections:    Talks on phone: Not on file    Gets together: Not on file    Attends religious service: Not on file    Active member of club or organization: Not on file    Attends meetings of clubs or organizations: Not on file    Relationship status: Not on file  . Intimate partner violence:    Fear of current or ex partner: Not on file    Emotionally abused: Not on file    Physically abused: Not on file    Forced sexual activity: Not on  file  Other Topics Concern  . Not on file  Social History Narrative  . Not on file    Relevant past medical, surgical, family and social history reviewed and updated as indicated. Interim medical history since our last visit reviewed. Allergies and medications reviewed and updated.  Review of Systems  Per HPI unless specifically indicated above     Objective:    BP 117/76 (BP Location: Left Arm, Patient Position: Sitting, Cuff Size: Normal)   Pulse 71   Temp 99 F (37.2 C) (Oral)   Ht '5\' 4"'$  (1.626 m)   Wt 170 lb 1.6 oz (77.2 kg)   SpO2 98%   BMI 29.20 kg/m   Wt Readings from Last 3 Encounters:  07/25/18 170 lb 1.6 oz (77.2 kg)  01/30/18 164 lb 4.8 oz (74.5 kg)  10/13/17 162 lb 12.8 oz (73.8 kg)    Physical Exam Vitals signs and nursing note reviewed.  Constitutional:      Appearance: Normal appearance. She is not ill-appearing.  HENT:     Head: Atraumatic.  Eyes:     Extraocular Movements: Extraocular movements intact.      Conjunctiva/sclera: Conjunctivae normal.  Neck:     Musculoskeletal: Normal range of motion and neck supple.  Cardiovascular:     Rate and Rhythm: Normal rate and regular rhythm.     Heart sounds: Normal heart sounds.  Pulmonary:     Effort: Pulmonary effort is normal.     Breath sounds: Normal breath sounds.  Musculoskeletal: Normal range of motion.  Skin:    General: Skin is warm and dry.  Neurological:     Mental Status: She is alert and oriented to person, place, and time.  Psychiatric:        Mood and Affect: Mood normal.        Thought Content: Thought content normal.        Judgment: Judgment normal.     Results for orders placed or performed in visit on 07/25/18  HNK1 (CD57) Panel  Result Value Ref Range   % CD8-/CD57+ Lymphs 8.0 2.0 - 17.0 %   Abs.CD8-CD57+ Lymphs 160 60 - 360 /uL   WBC 7.2 3.4 - 10.8 x10E3/uL   RBC 4.95 3.77 - 5.28 x10E6/uL   Hemoglobin 15.1 11.1 - 15.9 g/dL   Hematocrit 45.0 34.0 - 46.6 %   MCV 91 79 - 97 fL   MCH 30.5 26.6 - 33.0 pg   MCHC 33.6 31.5 - 35.7 g/dL   RDW 11.7 11.7 - 15.4 %   Platelets 296 150 - 450 x10E3/uL   Neutrophils 61 Not Estab. %   Lymphs 28 Not Estab. %   Monocytes 7 Not Estab. %   Eos 3 Not Estab. %   Basos 1 Not Estab. %   Neutrophils Absolute 4.4 1.4 - 7.0 x10E3/uL   Lymphocytes Absolute 2.0 0.7 - 3.1 x10E3/uL   Monocytes Absolute 0.5 0.1 - 0.9 x10E3/uL   EOS (ABSOLUTE) 0.2 0.0 - 0.4 x10E3/uL   Basophils Absolute 0.1 0.0 - 0.2 x10E3/uL   Immature Granulocytes 0 Not Estab. %   Immature Grans (Abs) 0.0 0.0 - 0.1 x10E3/uL  Vitamin B12  Result Value Ref Range   Vitamin B-12 880 232 - 1,245 pg/mL  Vitamin D (25 hydroxy)  Result Value Ref Range   Vit D, 25-Hydroxy 27.8 (L) 30.0 - 100.0 ng/mL  ANA w/Reflex  Result Value Ref Range   Anti Nuclear Antibody(ANA) Negative Negative  Sed Rate (ESR)  Result  Value Ref Range   Sed Rate 10 0 - 40 mm/hr  C-reactive protein  Result Value Ref Range   CRP 1 0 - 10 mg/L    Rocky mtn spotted fvr abs pnl(IgG+IgM)  Result Value Ref Range   RMSF IgG Negative Negative   RMSF IgM 0.23 0.00 - 0.89 index  Rheumatoid Factor  Result Value Ref Range   Rhuematoid fact SerPl-aCnc <10.0 0.0 - 13.9 IU/mL  Thyroid Panel With TSH  Result Value Ref Range   TSH 1.690 0.450 - 4.500 uIU/mL   T4, Total 5.9 4.5 - 12.0 ug/dL   T3 Uptake Ratio 24 24 - 39 %   Free Thyroxine Index 1.4 1.2 - 4.9  HgB A1c  Result Value Ref Range   Hgb A1c MFr Bld 6.0 (H) 4.8 - 5.6 %   Est. average glucose Bld gHb Est-mCnc 126 mg/dL  Lipid Panel w/o Chol/HDL Ratio  Result Value Ref Range   Cholesterol, Total 176 100 - 199 mg/dL   Triglycerides 116 0 - 149 mg/dL   HDL 53 >39 mg/dL   VLDL Cholesterol Cal 23 5 - 40 mg/dL   LDL Calculated 100 (H) 0 - 99 mg/dL      Assessment & Plan:   Problem List Items Addressed This Visit      Endocrine   Hypothyroid - Primary    Will recheck thyroid labs to ensure she is still being appropriately supplemented and adjust as needed      IFG (impaired fasting glucose)   Relevant Orders   HgB A1c (Completed)     Other   Lyme disease    Followed by a chronic lyme specialist. Requesting CD57 labs      Relevant Orders   HNK1 (CD57) Panel (Completed)   Hyperlipemia    Recheck lipids, continue working on lifestyle modifications. Does not wish to be on cholesterol medication      Relevant Orders   Lipid Panel w/o Chol/HDL Ratio (Completed)    Other Visit Diagnoses    Fatigue, unspecified type       Will check autoimmune screening labs and vit levels as well as RMSF testing for r/o. Potentially more mood related but does not want to start meds/counseling   Relevant Orders   Vitamin B12 (Completed)   Vitamin D (25 hydroxy) (Completed)   Rocky mtn spotted fvr abs pnl(IgG+IgM) (Completed)   Rheumatoid Factor (Completed)   CBC with Differential/Platelet   Thyroid Panel With TSH (Completed)   Arthralgia, unspecified joint       Relevant Orders    ANA w/Reflex (Completed)   Sed Rate (ESR) (Completed)   C-reactive protein (Completed)       Follow up plan: Return in about 6 months (around 01/23/2019) for CPE.

## 2018-07-27 LAB — SEDIMENTATION RATE: Sed Rate: 10 mm/hr (ref 0–40)

## 2018-07-27 LAB — C-REACTIVE PROTEIN: CRP: 1 mg/L (ref 0–10)

## 2018-07-27 LAB — HNK1 (CD57) PANEL
% CD8-/CD57+ Lymphs: 8 % (ref 2.0–17.0)
Abs.CD8-CD57+ Lymphs: 160 /uL (ref 60–360)
BASOS ABS: 0.1 10*3/uL (ref 0.0–0.2)
Basos: 1 %
EOS (ABSOLUTE): 0.2 10*3/uL (ref 0.0–0.4)
EOS: 3 %
Hematocrit: 45 % (ref 34.0–46.6)
Hemoglobin: 15.1 g/dL (ref 11.1–15.9)
IMMATURE GRANS (ABS): 0 10*3/uL (ref 0.0–0.1)
IMMATURE GRANULOCYTES: 0 %
Lymphocytes Absolute: 2 10*3/uL (ref 0.7–3.1)
Lymphs: 28 %
MCH: 30.5 pg (ref 26.6–33.0)
MCHC: 33.6 g/dL (ref 31.5–35.7)
MCV: 91 fL (ref 79–97)
MONOCYTES: 7 %
Monocytes Absolute: 0.5 10*3/uL (ref 0.1–0.9)
NEUTROS PCT: 61 %
Neutrophils Absolute: 4.4 10*3/uL (ref 1.4–7.0)
PLATELETS: 296 10*3/uL (ref 150–450)
RBC: 4.95 x10E6/uL (ref 3.77–5.28)
RDW: 11.7 % (ref 11.7–15.4)
WBC: 7.2 10*3/uL (ref 3.4–10.8)

## 2018-07-27 LAB — VITAMIN D 25 HYDROXY (VIT D DEFICIENCY, FRACTURES): VIT D 25 HYDROXY: 27.8 ng/mL — AB (ref 30.0–100.0)

## 2018-07-27 LAB — HEMOGLOBIN A1C
Est. average glucose Bld gHb Est-mCnc: 126 mg/dL
Hgb A1c MFr Bld: 6 % — ABNORMAL HIGH (ref 4.8–5.6)

## 2018-07-27 LAB — LIPID PANEL W/O CHOL/HDL RATIO
CHOLESTEROL TOTAL: 176 mg/dL (ref 100–199)
HDL: 53 mg/dL (ref >39)
LDL Calculated: 100 mg/dL — ABNORMAL HIGH (ref 0–99)
Triglycerides: 116 mg/dL (ref 0–149)
VLDL Cholesterol Cal: 23 mg/dL (ref 5–40)

## 2018-07-27 LAB — THYROID PANEL WITH TSH
Free Thyroxine Index: 1.4 (ref 1.2–4.9)
T3 Uptake Ratio: 24 % (ref 24–39)
T4, Total: 5.9 ug/dL (ref 4.5–12.0)
TSH: 1.69 u[IU]/mL (ref 0.450–4.500)

## 2018-07-27 LAB — ROCKY MTN SPOTTED FVR ABS PNL(IGG+IGM)
RMSF IGG: NEGATIVE
RMSF IgM: 0.23 index (ref 0.00–0.89)

## 2018-07-27 LAB — VITAMIN B12: Vitamin B-12: 880 pg/mL (ref 232–1245)

## 2018-07-27 LAB — RHEUMATOID FACTOR: Rhuematoid fact SerPl-aCnc: <10 IU/mL (ref 0.0–13.9)

## 2018-07-27 LAB — ANA W/REFLEX: Anti Nuclear Antibody(ANA): NEGATIVE

## 2018-07-29 DIAGNOSIS — R7301 Impaired fasting glucose: Secondary | ICD-10-CM | POA: Insufficient documentation

## 2018-07-29 NOTE — Assessment & Plan Note (Signed)
Followed by a chronic lyme specialist. Requesting CD57 labs

## 2018-07-29 NOTE — Assessment & Plan Note (Signed)
Recheck lipids, continue working on lifestyle modifications. Does not wish to be on cholesterol medication

## 2018-07-29 NOTE — Assessment & Plan Note (Signed)
Will recheck thyroid labs to ensure she is still being appropriately supplemented and adjust as needed

## 2018-08-04 ENCOUNTER — Other Ambulatory Visit: Payer: Self-pay | Admitting: Family Medicine

## 2018-08-04 MED ORDER — THYROID 32.5 MG PO TABS: 32.5000 mg | ORAL_TABLET | Freq: Every day | ORAL | 0 refills | Status: DC

## 2018-08-04 NOTE — Telephone Encounter (Signed)
Requested Prescriptions  Pending Prescriptions Disp Refills  . thyroid (NATURE-THROID) 32.5 MG tablet 90 tablet 0    Sig: Take 1 tablet (32.5 mg total) by mouth daily.     Endocrinology:  Hypothyroid Agents Failed - 08/04/2018 11:13 AM      Failed - TSH needs to be rechecked within 3 months after an abnormal result. Refill until TSH is due.      Passed - TSH in normal range and within 360 days    TSH  Date Value Ref Range Status  07/25/2018 1.690 0.450 - 4.500 uIU/mL Final         Passed - Valid encounter within last 12 months    Recent Outpatient Visits          1 week ago Acquired hypothyroidism   [facility] [place] Maser [place], [place]   6 months ago Acute bacterial conjunctivitis of left eye   [facility] Particia [place], [place]   9 months ago Encounter for [employer] annual wellness exam   [facility] Roosvelt Maser [place], [place]   11 months ago Insomnia, unspecified type   [facility], Salley Hews, [place]      Future Appointments            In 3 days Maurice March, [place], PA-C [facility]

## 2018-08-04 NOTE — Telephone Encounter (Signed)
Copied from CRM [id number]. Topic: Quick Communication - Rx Refill/Question >> Aug 04, 2018 11:00 AM Jilda Roche wrote: Medication: thyroid (NATURE-THROID) 32.5 MG tablet  Has the patient contacted their pharmacy? No. (Agent: If no, request that the patient contact the pharmacy for the refill.) (Agent: If yes, when and what did the pharmacy advise?)  Preferred Pharmacy (with phone number or street name): [id number] [place], [place] - [address] AT [facility] [phone] (Phone) [phone] (Fax)    Agent: Please be advised that RX refills may take up to 3 business days. We ask that you follow-up with your pharmacy.

## 2018-08-07 ENCOUNTER — Encounter: Payer: Self-pay | Admitting: Family Medicine

## 2018-08-07 ENCOUNTER — Ambulatory Visit (INDEPENDENT_AMBULATORY_CARE_PROVIDER_SITE_OTHER): Payer: Medicare HMO | Admitting: Family Medicine

## 2018-08-07 VITALS — BP 146/79 | HR 67 | Temp 98.3°F | Ht 64.0 in | Wt 170.3 lb

## 2018-08-07 DIAGNOSIS — R7301 Impaired fasting glucose: Secondary | ICD-10-CM

## 2018-08-07 DIAGNOSIS — R69 Illness, unspecified: Secondary | ICD-10-CM | POA: Diagnosis not present

## 2018-08-07 DIAGNOSIS — A6920 Lyme disease, unspecified: Secondary | ICD-10-CM | POA: Diagnosis not present

## 2018-08-07 DIAGNOSIS — E039 Hypothyroidism, unspecified: Secondary | ICD-10-CM | POA: Diagnosis not present

## 2018-08-07 DIAGNOSIS — G4700 Insomnia, unspecified: Secondary | ICD-10-CM | POA: Diagnosis not present

## 2018-08-07 MED ORDER — GLUCOSE BLOOD VI STRP: ORAL_STRIP | 12 refills | Status: DC

## 2018-08-07 MED ORDER — VITAMIN D (ERGOCALCIFEROL) 1.25 MG (50000 UNIT) PO CAPS: 50000.0000 [IU] | ORAL_CAPSULE | ORAL | 3 refills | Status: DC

## 2018-08-07 MED ORDER — DIAZEPAM 5 MG PO TABS: 5.0000 mg | ORAL_TABLET | Freq: Two times a day (BID) | ORAL | 0 refills | Status: DC | PRN

## 2018-08-07 NOTE — Progress Notes (Signed)
BP (!) 146/79 (BP Location: Left Arm, Cuff Size: Normal)   Pulse 67   Temp 98.3 F (36.8 C)   Ht _0  (1.626 m)   Wt 170 lb 5 oz (77.3 kg)   SpO2 97%   BMI 29.23 kg/m    Subjective:    Patient ID: Veronica Mills, female    DOB: 03-Dec-1955, 63 y.o.   MRN: [id number]  HPI: Veronica Mills is a 63 y.o. female  Chief Complaint  Patient presents with  . Other    Patient would like to discuss recent lab results and what the next steps are   Here today to discuss continued fatigue, arthralgias, and poor sleep. Labs were drawn at recent visit for thyroid testing, autoimmune screening, and basic testing which were all fairly benign without obvious causation of these sxs. Pt very interested in increasing her thyroid medicine despite normal TSH at 1.6 because she thinks this will help with her sxs. Currently taking 32.5 mg nature thyroid. Does note also she has never slept well and has used gabapentin for years for sleep which helps some. Notes daytime somnolence and snoring. Interested in a sleep study.   Relevant past medical, surgical, family and social history reviewed and updated as indicated. Interim medical history since our last visit reviewed. Allergies and medications reviewed and updated.  Review of Systems  Per HPI unless specifically indicated above     Objective:    BP (!) 146/79 (BP Location: Left Arm, Cuff Size: Normal)   Pulse 67   Temp 98.3 F (36.8 C)   Ht _1  (1.626 m)   Wt 170 lb 5 oz (77.3 kg)   SpO2 97%   BMI 29.23 kg/m   Wt Readings from Last 3 Encounters:  08/07/18 170 lb 5 oz (77.3 kg)  07/25/18 170 lb 1.6 oz (77.2 kg)  01/30/18 164 lb 4.8 oz (74.5 kg)    Physical Exam Vitals signs and nursing note reviewed.  Constitutional:      Appearance: Normal appearance. She is not ill-appearing.  HENT:     Head: Atraumatic.  Eyes:     Extraocular Movements: Extraocular movements intact.     Conjunctiva/sclera: Conjunctivae normal.  Neck:   Musculoskeletal: Normal range of motion and neck supple.  Cardiovascular:     Rate and Rhythm: Normal rate and regular rhythm.     Heart sounds: Normal heart sounds.  Pulmonary:     Effort: Pulmonary effort is normal.     Breath sounds: Normal breath sounds.  Musculoskeletal: Normal range of motion.  Skin:    General: Skin is warm and dry.  Neurological:     Mental Status: She is alert and oriented to person, place, and time.  Psychiatric:        Mood and Affect: Mood normal.        Thought Content: Thought content normal.        Judgment: Judgment normal.     Results for orders placed or performed in visit on 07/25/18  HNK1 (CD57) Panel  Result Value Ref Range   % CD8-/CD57+ Lymphs 8.0 2.0 - 17.0 %   Abs.CD8-CD57+ Lymphs 160 60 - 360 /uL   WBC 7.2 3.4 - 10.8 x10E3/uL   RBC 4.95 3.77 - 5.28 x10E6/uL   Hemoglobin 15.1 11.1 - 15.9 g/dL   Hematocrit 45.0 34.0 - 46.6 %   MCV 91 79 - 97 fL   MCH 30.5 26.6 - 33.0 pg   MCHC 33.6 31.5 - 35.7  g/dL   RDW 11.7 11.7 - 15.4 %   Platelets 296 150 - 450 x10E3/uL   Neutrophils 61 Not Estab. %   Lymphs 28 Not Estab. %   Monocytes 7 Not Estab. %   Eos 3 Not Estab. %   Basos 1 Not Estab. %   Neutrophils Absolute 4.4 1.4 - 7.0 x10E3/uL   Lymphocytes Absolute 2.0 0.7 - 3.1 x10E3/uL   Monocytes Absolute 0.5 0.1 - 0.9 x10E3/uL   EOS (ABSOLUTE) 0.2 0.0 - 0.4 x10E3/uL   Basophils Absolute 0.1 0.0 - 0.2 x10E3/uL   Immature Granulocytes 0 Not Estab. %   Immature Grans (Abs) 0.0 0.0 - 0.1 x10E3/uL  Vitamin B12  Result Value Ref Range   Vitamin B-12 880 232 - 1,245 pg/mL  Vitamin D (25 hydroxy)  Result Value Ref Range   Vit D, 25-Hydroxy 27.8 (L) 30.0 - 100.0 ng/mL  ANA w/Reflex  Result Value Ref Range   Anti Nuclear Antibody(ANA) Negative Negative  Sed Rate (ESR)  Result Value Ref Range   Sed Rate 10 0 - 40 mm/hr  C-reactive protein  Result Value Ref Range   CRP 1 0 - 10 mg/L  Rocky mtn spotted fvr abs pnl(IgG+IgM)  Result Value Ref  Range   RMSF IgG Negative Negative   RMSF IgM 0.23 0.00 - 0.89 index  Rheumatoid Factor  Result Value Ref Range   Rhuematoid fact SerPl-aCnc <10.0 0.0 - 13.9 IU/mL  Thyroid Panel With TSH  Result Value Ref Range   TSH 1.690 0.450 - 4.500 uIU/mL   T4, Total 5.9 4.5 - 12.0 ug/dL   T3 Uptake Ratio 24 24 - 39 %   Free Thyroxine Index 1.4 1.2 - 4.9  HgB A1c  Result Value Ref Range   Hgb A1c MFr Bld 6.0 (H) 4.8 - 5.6 %   Est. average glucose Bld gHb Est-mCnc 126 mg/dL  Lipid Panel w/o Chol/HDL Ratio  Result Value Ref Range   Cholesterol, Total 176 100 - 199 mg/dL   Triglycerides 116 0 - 149 mg/dL   HDL 53 >39 mg/dL   VLDL Cholesterol Cal 23 5 - 40 mg/dL   LDL Calculated 100 (H) 0 - 99 mg/dL      Assessment & Plan:   Problem List Items Addressed This Visit      Endocrine   Hypothyroid    Will refer to Endocrinology per pt request for more aggressive thyroid management. Discussed with her that her thyroid level was excellent and that I didn't feel comfortable increasing her dose for risk of causing over treatment.       IFG (impaired fasting glucose)    Very concerned about her elevated A1C, states she eats very clean but can't get her numbers down. Requesting test strips to be checking along so she can figure out what foods are setting things off. Will recheck A1C per her request and continue to monitor. Recommended we start low dose metformin if increasing since she is already eating very well and staying active but pt declines at this time      Relevant Orders   Ambulatory referral to Endocrinology     Other   Insomnia - Primary    Will refer to sleep specialist for further testing. Continue current regimen and good sleep hygiene      Relevant Orders   Ambulatory referral to Sleep Studies   Lyme disease    Chronic, followed by Lyme Specialist. Unclear if this is playing a role in current  sxs          Follow up plan: Return in about 3 months (around 11/05/2018) for  A1C f/u.

## 2018-08-11 NOTE — Assessment & Plan Note (Signed)
Very concerned about her elevated A1C, states she eats very clean but can't get her numbers down. Requesting test strips to be checking along so she can figure out what foods are setting things off. Will recheck A1C per her request and continue to monitor. Recommended we start low dose metformin if increasing since she is already eating very well and staying active but pt declines at this time

## 2018-08-11 NOTE — Assessment & Plan Note (Signed)
Will refer to Endocrinology per pt request for more aggressive thyroid management. Discussed with her that her thyroid level was excellent and that I didn't feel comfortable increasing her dose for risk of causing over treatment.

## 2018-08-11 NOTE — Assessment & Plan Note (Signed)
Chronic, followed by Lyme Specialist. Unclear if this is playing a role in current sxs

## 2018-08-11 NOTE — Assessment & Plan Note (Signed)
Will refer to sleep specialist for further testing. Continue current regimen and good sleep hygiene

## 2018-08-24 DIAGNOSIS — E039 Hypothyroidism, unspecified: Secondary | ICD-10-CM | POA: Diagnosis not present

## 2018-08-24 DIAGNOSIS — R7309 Other abnormal glucose: Secondary | ICD-10-CM | POA: Diagnosis not present

## 2018-08-30 ENCOUNTER — Ambulatory Visit: Payer: Medicare HMO | Admitting: Neurology

## 2018-08-30 ENCOUNTER — Encounter: Payer: Self-pay | Admitting: Neurology

## 2018-08-30 ENCOUNTER — Other Ambulatory Visit: Payer: Self-pay

## 2018-08-30 VITALS — Ht 64.0 in | Wt 170.0 lb

## 2018-08-30 DIAGNOSIS — Z8619 Personal history of other infectious and parasitic diseases: Secondary | ICD-10-CM | POA: Diagnosis not present

## 2018-08-30 DIAGNOSIS — F5104 Psychophysiologic insomnia: Secondary | ICD-10-CM

## 2018-08-30 DIAGNOSIS — R69 Illness, unspecified: Secondary | ICD-10-CM | POA: Diagnosis not present

## 2018-08-30 DIAGNOSIS — N951 Menopausal and female climacteric states: Secondary | ICD-10-CM

## 2018-08-30 DIAGNOSIS — A6920 Lyme disease, unspecified: Secondary | ICD-10-CM | POA: Diagnosis not present

## 2018-08-30 NOTE — Progress Notes (Signed)
SLEEP MEDICINE CLINIC   Provider:  Melvyn Novas, MD   Primary Care Physician:  Particia Nearing, PA-C   Referring Provider: Doran Heater   Chief Complaint  Patient presents with  . New Patient (Initial Visit)    pt alone, rm 11. pt states that she has had trouble with falling asleep and staying asleep for years. she snores in her sleep loudly. has not been told that she stops breathing. never had a sleep study.    HPI:  Veronica Mills is a 63 y.o. female patient and seen here on 08-30-2018 in a referral from PA [place] at [facility].  Veronica Mills is a 63 year old Caucasian female patient of the [facility] she was recently tested was on impaired fasting glucose, she has also known thyroid disease, and known and congenital bowel abnormality.  She tested low for vitamin D, and she has filled a 50000 unit prescription and is NOT taking it. She has hot flushes at time. She  a congenital deformity of the right hand.  She has noted increasing daytime somnolence and is aware of snoring loudly at night but her main problem is to initiate sleep which seems to be difficult.   Chief complaint according to patient : " I will be soo tired and lay down, only to stare at the ceiling"   Sleep habits are as follows: Dinner is 5.30 AM and she watches TV until 9 PM, her husband and the patient go to bed rather early. She reads in bed on a kindle device. She falls asleep between 10.30 and one AM- a wide span.   The bedroom is cool and dark. Her heating system in her new house is very loud. The patient will sleep some nights room from there on or wake up between 3 and 4 AM, this is not related to the urge to urinate.  She does not think that snoring may contribute to these arousals, discomfort or pain may however contribute.  She does not recall dreaming or at least not lately.  Between 630 and 730 is her rise time, a spontaneous arousal. She doesn't work outside the home.    Sleep medical history: insomnia - decades, on and off. Snoring more recent 2 years.  Had gestational diabetes and has menopausal symptoms.   Family sleep history: 1 brother and 2 sisters, no OSA history. Father has vascular dementia.    Social history: Married, has pets ( dog). Drinks decaffeinated coffee in AM. No sodas, no iced tea.  Ex smoker- [id number]. Alcohol, 2 a month. .     Review of Systems: Out of a complete 14 system review, the patient complains of only the following symptoms, and all other reviewed systems are negative.   Epworth score: 0/ 24 - no naps in daytime - " I just Can't sleep"  , Fatigue severity score: 54/ 63   , Geriatric depression score 3/ 15 . Seasonal affective disorder.. I avoid grains and sugars - I have grain allergies. Dairy sensitivity.  " chronic Lyme"   Social History   Socioeconomic History  . Marital status: Married    Spouse name: Not on file  . Number of children: Not on file  . Years of education: Not on file  . Highest education level: Not on file  Occupational History  . Not on file  Social Needs  . Financial resource strain: Not on file  . Food insecurity:    Worry: Not on file  Inability: Not on file  . Transportation needs:    Medical: Not on file    Non-medical: Not on file  Tobacco Use  . Smoking status: Former Smoker    Last attempt to quit: 1984    Years since quitting: 36.2  . Smokeless tobacco: Never Used  Substance and Sexual Activity  . Alcohol use: Yes    Frequency: Never    Comment: 1 a month  . Drug use: No  . Sexual activity: Not on file  Lifestyle  . Physical activity:    Days per week: Not on file    Minutes per session: Not on file  . Stress: Not on file  Relationships  . Social connections:    Talks on phone: Not on file    Gets together: Not on file    Attends religious service: Not on file    Active member of club or organization: Not on file    Attends meetings of clubs or organizations:  Not on file    Relationship status: Not on file  . Intimate partner violence:    Fear of current or ex partner: Not on file    Emotionally abused: Not on file    Physically abused: Not on file    Forced sexual activity: Not on file  Other Topics Concern  . Not on file  Social History Narrative  . Not on file    Family History  Problem Relation Age of Onset  . Mental retardation Mother   . Stroke Father   . Dementia Father     Past Medical History:  Diagnosis Date  . Anxiety   . Bursitis of both hips   . Depression   . Insomnia   . Lyme disease   . Thyroid disease     Past Surgical History:  Procedure Laterality Date  . ABDOMINAL HYSTERECTOMY  06/21/1997  . SHOULDER SURGERY Left 05/2009    Current Outpatient Medications  Medication Sig Dispense Refill  . diazepam (VALIUM) 5 MG tablet Take 1 tablet (5 mg total) by mouth every 12 (twelve) hours as needed for anxiety. 6 tablet 0  . diclofenac (VOLTAREN) 50 MG EC tablet Take 1 tablet (50 mg total) by mouth 2 (two) times daily. 180 tablet 1  . gabapentin (NEURONTIN) 600 MG tablet Take 1,200 mg by mouth at bedtime.    Marland Kitchen glucose blood test strip Use as instructed 100 each 12  . progesterone (PROMETRIUM) 100 MG capsule TAKE 1 CAPSULE BY MOUTH EVERY DAY 90 capsule 2  . progesterone (PROMETRIUM) 200 MG capsule TAKE 1 CAPSULE BY MOUTH EVERY DAY 90 capsule 2  . thyroid (NATURE-THROID) 32.5 MG tablet Take 1 tablet (32.5 mg total) by mouth daily. 90 tablet 0  . trimethoprim-polymyxin b (POLYTRIM) ophthalmic solution Place 1 drop into the left eye every 4 (four) hours. 10 mL 0  . Vitamin D, Ergocalciferol, (DRISDOL) 1.25 MG (50000 UT) CAPS capsule Take 1 capsule (50,000 Units total) by mouth every 7 (seven) days. 12 capsule 3   No current facility-administered medications for this visit.     Allergies as of 08/30/2018 - Review Complete 08/30/2018  Allergen Reaction Noted  . Plaquenil [hydroxychloroquine sulfate] Itching  09/05/2017  . Rocephin [ceftriaxone sodium in dextrose] Hives 09/05/2017  . Prednisone Other (See Comments) 09/05/2017  . Erythromycin  09/05/2017  . Trazodone and nefazodone  09/05/2017    Vitals: Ht 5\' 4"  (1.626 m)   Wt 170 lb (77.1 kg)   BMI 29.18 kg/m  Last Weight:  Wt Readings from Last 1 Encounters:  08/30/18 170 lb (77.1 kg)   YTR:ZNBV mass index is 29.18 kg/m.     Last Height:   Ht Readings from Last 1 Encounters:  08/30/18 5\' 4"  (1.626 m)    Physical exam:  General: The patient is awake, alert and appears not in acute distress. The patient is well groomed. Head: Normocephalic, atraumatic. Neck is supple. Mallampati : 3- low hanging uvula. ,  neck circumference:14. 5" . Nasal airflow patent., Retrognathia is not seen. Cardiovascular:  Regular rate and rhythm, without  murmurs or carotid bruit, and without distended neck veins. Respiratory: Lungs are clear to auscultation. Skin:  Without evidence of edema, or rash Trunk: BMI is 29.18 kg/m2.  The patient's posture is erect.  Neurologic exam : The patient is awake and alert, oriented to place and time.    Attention span & concentration ability appears normal.  Speech is fluent,  without dysarthria, dysphonia or aphasia.  Mood and affect are appropriate.  Cranial nerves: Pupils are equal and briskly reactive to light.  Extraocular movements  in vertical and horizontal planes intact and without nystagmus.  Visual fields by finger perimetry are intact. Hearing to finger rub intact.  Facial sensation intact to fine touch. Facial motor strength is symmetric and tongue and uvula move midline. Shoulder shrug was symmetrical.   Motor exam:  Normal tone, muscle bulk and symmetric strength in all extremities.  Sensory:  Proprioception tested in the upper extremities was normal.  Coordination: the right hand is congenitally deformed. Finger-to-nose maneuver normal without evidence of ataxia, dysmetria or tremor.  Gait  and station: Patient walks without assistive device. Strength within normal limits. Stance is stable and normal.   Deep tendon reflexes: in the upper and lower extremities are symmetric and intact. Babinski maneuver response is downgoing.   Assessment:  After physical and neurologic examination, review of laboratory studies,  Personal review of imaging studies, reports of other /same  Imaging studies, results of polysomnography and / or neurophysiology testing and pre-existing records as far as provided in visit., my assessment is:    1)  The chronic insomnia is not associated with hypersomnia, is present for many decades and is unlikely of organic origin.   2) I explained that there is no chronic Lyme but sequelae may last for a long time.   3)  Fatigue is her prominant symptom and can be related to thyroid disease,  4 ) OSA - snoring is present. She is waking up with headaches.   The patient was advised of the nature of the diagnosed disorder , the treatment options and the  risks for general health and wellness arising from not treating the condition.   I spent more than 40 minutes of face to face time with the patient.  Greater than 50% of time was spent in counseling and coordination of care. We have discussed the diagnosis and differential and I answered the patient's questions.    Plan:  Treatment plan and additional workup :   HST for patient 's comfort.      Melvyn Novas, MD 08/30/2018, 9:48 AM  Certified in Neurology by ABPN Certified in Sleep Medicine by [facility] [address] [place], [place] [address]

## 2018-09-04 DIAGNOSIS — R69 Illness, unspecified: Secondary | ICD-10-CM | POA: Diagnosis not present

## 2018-09-06 ENCOUNTER — Telehealth: Payer: Self-pay | Admitting: Family Medicine

## 2018-09-06 NOTE — Telephone Encounter (Signed)
Copied from CRM [id number]. Topic: Quick Communication - Rx Refill/Question >> Sep 06, 2018  3:41 PM Maia Petties wrote: Medication: gabapentin (NEURONTIN) 600 MG tablet - takes 2/bedtime - has 1 wk left -  pt generally has this compounded due to the use of cornstarch. Pt states it is over $300 to have compounded. She is wanting to try the regular gabapentin from the pharmacy and see if she reacts. If no reaction pt is sure it will be less expensive for her.   Has the patient contacted their pharmacy? No - wanting to try standard dosage/non-compounded Preferred Pharmacy (with phone number or street name): [id number] - [place], [place] - [address] [phone] (Phone) [phone] (Fax)

## 2018-09-07 MED ORDER — GABAPENTIN 600 MG PO TABS: 600.0000 mg | ORAL_TABLET | Freq: Two times a day (BID) | ORAL | 1 refills | Status: DC

## 2018-09-07 NOTE — Telephone Encounter (Signed)
Message relayed to patient. Verbalized understanding and denied questions.   

## 2018-09-07 NOTE — Telephone Encounter (Signed)
Rx sent - she should let us know if she has any issues

## 2018-09-21 ENCOUNTER — Other Ambulatory Visit: Payer: Self-pay

## 2018-09-21 ENCOUNTER — Ambulatory Visit (INDEPENDENT_AMBULATORY_CARE_PROVIDER_SITE_OTHER): Payer: Medicare HMO | Admitting: Neurology

## 2018-09-21 DIAGNOSIS — Z8619 Personal history of other infectious and parasitic diseases: Secondary | ICD-10-CM

## 2018-09-21 DIAGNOSIS — G4733 Obstructive sleep apnea (adult) (pediatric): Secondary | ICD-10-CM

## 2018-09-21 DIAGNOSIS — F5104 Psychophysiologic insomnia: Secondary | ICD-10-CM

## 2018-09-21 DIAGNOSIS — N951 Menopausal and female climacteric states: Secondary | ICD-10-CM

## 2018-10-01 ENCOUNTER — Other Ambulatory Visit: Payer: Self-pay | Admitting: Family Medicine

## 2018-10-02 DIAGNOSIS — R69 Illness, unspecified: Secondary | ICD-10-CM | POA: Diagnosis not present

## 2018-10-09 ENCOUNTER — Telehealth: Payer: Self-pay | Admitting: Neurology

## 2018-10-09 ENCOUNTER — Other Ambulatory Visit: Payer: Self-pay | Admitting: Neurology

## 2018-10-09 ENCOUNTER — Encounter: Payer: Self-pay | Admitting: Neurology

## 2018-10-09 DIAGNOSIS — G4733 Obstructive sleep apnea (adult) (pediatric): Secondary | ICD-10-CM

## 2018-10-09 DIAGNOSIS — F5104 Psychophysiologic insomnia: Secondary | ICD-10-CM

## 2018-10-09 NOTE — Addendum Note (Signed)
Addended by: Melvyn Novas on: 10/09/2018 12:33 PM   Modules accepted: Orders

## 2018-10-09 NOTE — Procedures (Signed)
  Patient Information     First Name: Veronica  Last Name: Mills ID: [id number]  Birth Date: 11-28-1955 Age: 63 Gender: Female  Referral: Raechel Ache, PA   BMI: 29.0 (W=170 lb, H=5' 4'')  Neck Circ.: Address: 15 '' Epworth:  0/24   Mobile Phone:  Sleep Study Information    Study Date: Sep 22, 2018 S/H/A Version: [phone] / 4.1.1531 / 25  Comments [name] is a 63 year old Caucasian female patient of the [facility] she was recently tested was on impaired fasting glucose, she has also known thyroid disease," chronic " Lyme disease,  and known and congenital bowel abnormality.  She tested low for vitamin D.    She has noted increasing daytime somnolence and is aware of snoring loudly at night but her main problem is to initiate sleep which seems to be difficult.  Chief complaint according to patient:  " I will be so very tired and lay down, only to stare at the ceiling" . Sleep habits are as follows: Dinner is 5.30 AM and she watches TV until 9 PM, her husband and the patient go to bed rather early. She reads in bed on a kindle device. She falls asleep between 10.30 and one AM- a wide span.   She does not think that snoring may contribute to these arousals, discomfort or pain may however contribute. She does not recall dreaming or at least not lately.  Between 6.30 and 7.30 is her rise time, a spontaneous arousal.    Summary & Diagnosis:     This HST documented an AHI of 17.8/h without accentuation in REM sleep, and without prolonged oxygen desaturation.  The estimated REM sleep proportion of sleep was over 20%. Snoring was indicated by the higher RDI / RERA count.   Recommendations:      This type of apnea can respond to CPAP and autotitration therapy can be prescribed. It is also treatable by dental device, or Occupational psychologist.    I will order an autotitration CPAP.   Signature: Melvyn Novas, MD            10-09-2018    Sleep Summary  Oxygen Saturation  Statistics in SpO2    Start Study Time: End Study Time: Total Recording Time:  9:32:05 PM 7:09:26 AM 9 h 37 min  Total Sleep Time % of REM sleep of Sleep Time:  8 h 4 min  26.3    Mean SpO2: 94%  Minimum SpO2:       86%  Maximum: 100  Mean of Desaturations Nadirs (%):   92  Oxygen Desaturation in %:   4-9 10-20 >20 Total  Events Number Total    71  2 97.3 2.7  0 0.0  73 100.0  Oxygen Saturation: <90 <=88 <85 <80 <70  Duration (minutes): Sleep % 0.7 0.1  0.4 0.0  0.1 0.0 0.0 0.0 0.0 0.0     Respiratory Indices      Total Events REM NREM All Night  pRDI:  204  pAHI:  143 ODI:  73  pAHIc:  21 %   CSR: 0.0 17.6 10.0 5.2 1.9 28.2 20.6 10.5 2.9 25.4 17.8 9.1 2.6       Pulse Rate Statistics during Sleep (BPM)      Mean:  60 Minimum: 45  Maximum: 99    Indices are calculated using technically valid sleep time of  8 hrs, 1 min. pRDI/pAHI are calculated using oxi desaturations ? 3%

## 2018-10-09 NOTE — Telephone Encounter (Signed)
Called the patient back and reviewed her SS results. Informed her that Dr Vickey Huger recommended CPAP but also discussed dental device as an option. Informed her of this as well. Patient will prefer to do dental device route. I advised I will place the dental referral in for the patient and she would like to touch base with her insurance company.  This HST documented an AHI of 17.8/h without accentuation in REM  sleep, and without prolonged oxygen desaturation.  The estimated REM sleep proportion of sleep was over 20%.  Snoring was indicated by the higher RDI / RERA count.   Recommendations:     This type of apnea can respond to CPAP and autotitration therapy  can be prescribed. It is also treatable by dental device, or  Occupational psychologist.    I will order an autotitration CPAP.

## 2018-10-09 NOTE — Telephone Encounter (Signed)
Pt calling in re: to finding out via My chart that Dr Vickey Huger is ordering her a cpap.  Pt is claustrophobic and does think a CPAP will work for her.  Pt is asking for a call to discuss her options.

## 2018-10-16 ENCOUNTER — Encounter: Payer: Self-pay | Admitting: Neurology

## 2018-10-16 ENCOUNTER — Telehealth: Payer: Self-pay | Admitting: Neurology

## 2018-10-16 NOTE — Telephone Encounter (Signed)
Pt states she is needing a PA for a mouth piece for her sleep apnea.  Please advise.

## 2018-10-16 NOTE — Telephone Encounter (Signed)
We do not complete PA's for dental devices. They are handled through the dentist office as far as whatever is needed.

## 2018-10-27 ENCOUNTER — Other Ambulatory Visit: Payer: Self-pay | Admitting: Family Medicine

## 2018-10-27 NOTE — Telephone Encounter (Signed)
Please advise 

## 2018-10-27 NOTE — Telephone Encounter (Signed)
Requested Prescriptions  Pending Prescriptions Disp Refills  . NATURE-THROID 32.5 MG tablet [Pharmacy Med Name: NATURE-THROID 0.5GR (32.5MG ) TABS] 90 tablet 0    Sig: TAKE 1 TABLET BY MOUTH DAILY     Endocrinology:  Hypothyroid Agents Failed - 10/27/2018  4:23 PM      Failed - TSH needs to be rechecked within 3 months after an abnormal result. Refill until TSH is due.      Passed - TSH in normal range and within 360 days    TSH  Date Value Ref Range Status  07/25/2018 1.690 0.450 - 4.500 uIU/mL Final         Passed - Valid encounter within last 12 months    Recent Outpatient Visits          2 months ago Insomnia, unspecified type   [facility] Particia Nearing, [place]   3 months ago Acquired hypothyroidism   [facility] Roosvelt Maser [place], [place]   9 months ago Acute bacterial conjunctivitis of left eye   [facility] Particia Nearing, [place]   1 year ago Encounter for [employer] annual wellness exam   [facility] Particia Nearing, [place]   1 year ago Insomnia, unspecified type   [facility] Particia Nearing, [place]      Future Appointments            In 1 week Particia Nearing, PA-C [facility], [facility]   In 1 week  [facility], [facility]         per Endocrinology note of 08/24/18, continue Ashby Dawes -Throid 32.5 mg qd.

## 2018-10-31 DIAGNOSIS — R69 Illness, unspecified: Secondary | ICD-10-CM | POA: Diagnosis not present

## 2018-11-07 ENCOUNTER — Ambulatory Visit: Payer: Medicare HMO | Admitting: Family Medicine

## 2018-11-08 ENCOUNTER — Other Ambulatory Visit: Payer: Self-pay

## 2018-11-08 ENCOUNTER — Ambulatory Visit: Payer: Medicare HMO | Admitting: Family Medicine

## 2018-11-08 ENCOUNTER — Other Ambulatory Visit: Payer: Medicare HMO

## 2018-11-08 ENCOUNTER — Ambulatory Visit (INDEPENDENT_AMBULATORY_CARE_PROVIDER_SITE_OTHER): Payer: Medicare HMO

## 2018-11-08 DIAGNOSIS — R7301 Impaired fasting glucose: Secondary | ICD-10-CM

## 2018-11-08 DIAGNOSIS — Z0000 Encounter for general adult medical examination without abnormal findings: Secondary | ICD-10-CM | POA: Diagnosis not present

## 2018-11-08 DIAGNOSIS — E559 Vitamin D deficiency, unspecified: Secondary | ICD-10-CM | POA: Diagnosis not present

## 2018-11-08 DIAGNOSIS — Z1211 Encounter for screening for malignant neoplasm of colon: Secondary | ICD-10-CM

## 2018-11-08 NOTE — Progress Notes (Signed)
Subjective:   Veronica Mills is a 63 y.o. female who presents for Medicare Annual (Initial) preventive examination.  This visit is being conducted via phone call  - after an attmept to do on video chat - due to the COVID-19 pandemic. This patient has given me verbal consent via phone to conduct this visit, patient states they are participating from their home address. Some vital signs may be absent or patient reported.   Patient identification: identified by name, DOB, and current address.    Review of Systems:         Objective:     Vitals: There were no vitals taken for this visit.  There is no height or weight on file to calculate BMI.  No flowsheet data found.  Tobacco Social History   Tobacco Use  Smoking Status Former Smoker  . Packs/day: 0.00  . Years: 0.00  . Pack years: 0.00  . Last attempt to quit: 1984  . Years since quitting: 36.4  Smokeless Tobacco Never Used     Counseling given: Not Answered   Clinical Intake:  Pre-visit preparation completed: Yes  Pain : 0-10 Pain Score: 3  Pain Type: Chronic pain Pain Location: Hip Pain Orientation: Left, Right Pain Descriptors / Indicators: Aching, Stabbing Pain Onset: More than a month ago Pain Frequency: Constant     Nutritional Risks: None Diabetes: No  How often do you need to have someone help you when you read instructions, pamphlets, or other written materials from your doctor or pharmacy?: 1 - Never What is the last grade level you completed in school?: masters   Interpreter Needed?: No  Information entered by :: Veronica Nadeau,LPN   Past Medical History:  Diagnosis Date  . Allergy   . Anxiety   . Bursitis of both hips   . Depression   . Insomnia   . Lyme disease   . Sleep apnea   . Thyroid disease    Past Surgical History:  Procedure Laterality Date  . ABDOMINAL HYSTERECTOMY  06/21/1997  . SHOULDER SURGERY Left 05/2009   Family History  Problem Relation Age of Onset  . Mental  retardation Mother   . Stroke Father   . Dementia Father    Social History   Socioeconomic History  . Marital status: Married    Spouse name: Not on file  . Number of children: Not on file  . Years of education: Not on file  . Highest education level: Master's degree (e.g., MA, MS, MEng, MEd, MSW, MBA)  Occupational History  . Occupation: disabled   Social Needs  . Financial resource strain: Not hard at all  . Food insecurity:    Worry: Never true    Inability: Never true  . Transportation needs:    Medical: No    Non-medical: No  Tobacco Use  . Smoking status: Former Smoker    Packs/day: 0.00    Years: 0.00    Pack years: 0.00    Last attempt to quit: 1984    Years since quitting: 36.4  . Smokeless tobacco: Never Used  Substance and Sexual Activity  . Alcohol use: Not Currently    Frequency: Never    Comment: 1 a month  . Drug use: No  . Sexual activity: Not on file  Lifestyle  . Physical activity:    Days per week: 4 days    Minutes per session: 30 min  . Stress: Not at all  Relationships  . Social connections:  Talks on phone: More than three times a week    Gets together: Twice a week    Attends religious service: Never    Active member of club or organization: No    Attends meetings of clubs or organizations: Never    Relationship status: Married  Other Topics Concern  . Not on file  Social History Narrative   Still new to the area    Outpatient Encounter Medications as of 11/08/2018  Medication Sig  . diazepam (VALIUM) 5 MG tablet Take 1 tablet (5 mg total) by mouth every 12 (twelve) hours as needed for anxiety.  . diclofenac (VOLTAREN) 50 MG EC tablet Take 1 tablet (50 mg total) by mouth 2 (two) times daily.  Marland Kitchen. gabapentin (NEURONTIN) 600 MG tablet TAKE 1 TABLET BY MOUTH TWICE A DAY  . glucose blood test strip Use as instructed  . NATURE-THROID 32.5 MG tablet TAKE 1 TABLET BY MOUTH DAILY  . progesterone (PROMETRIUM) 100 MG capsule TAKE 1 CAPSULE BY  MOUTH EVERY DAY  . progesterone (PROMETRIUM) 200 MG capsule TAKE 1 CAPSULE BY MOUTH EVERY DAY  . trimethoprim-polymyxin b (POLYTRIM) ophthalmic solution Place 1 drop into the left eye every 4 (four) hours.   No facility-administered encounter medications on file as of 11/08/2018.     Activities of Daily Living In your present state of health, do you have any difficulty performing the following activities: 11/08/2018  Hearing? N  Vision? N  Difficulty concentrating or making decisions? N  Walking or climbing stairs? Y  Comment little pain   Dressing or bathing? N  Doing errands, shopping? N  Preparing Food and eating ? N  Using the Toilet? N  In the past six months, have you accidently leaked urine? N  Do you have problems with loss of bowel control? N  Managing your Medications? N  Managing your Finances? N  Housekeeping or managing your Housekeeping? N  Some recent data might be hidden    Patient Care Team: Particia NearingLane, Veronica Elizabeth, PA-C as PCP - General (Family Medicine)    Assessment:   This is a routine wellness examination for [name].  Exercise Activities and Dietary recommendations Current Exercise Habits: Home exercise routine, Type of exercise: walking, Time (Minutes): 30, Frequency (Times/Week): 4, Weekly Exercise (Minutes/Week): 120, Intensity: Mild, Exercise limited by: None identified  Goals   None     Fall Risk: Fall Risk  11/08/2018 10/13/2017  Falls in the past year? 0 No    FALL RISK PREVENTION PERTAINING TO THE HOME:  Any stairs in or around the home? Yes  If so, are there any without handrails? No   Home free of loose throw rugs in walkways, pet beds, electrical cords, etc? Yes  Adequate lighting in your home to reduce risk of falls? Yes   ASSISTIVE DEVICES UTILIZED TO PREVENT FALLS:  Life alert? No  Use of a cane, walker or w/c? No  Grab bars in the bathroom? No  Shower chair or bench in shower? No  Elevated toilet seat or a handicapped toilet?  No   DME ORDERS:  DME order needed?  No   TIMED UP AND GO:  Unable to perform    Depression Screen PHQ 2/9 Scores 11/08/2018 10/13/2017  PHQ - 2 Score 0 0     Cognitive Function     6CIT Screen 11/08/2018 10/16/2017  What Year? 0 points 0 points  What month? 0 points 0 points  What time? 0 points 0 points  Count back  from 20 0 points 0 points  Months in reverse 0 points 0 points  Repeat phrase 0 points 0 points  Total Score 0 0     There is no immunization history on file for this patient.  Qualifies for Shingles Vaccine? Yes  Zostavax completed n/a. Due for Shingrix. Education has been provided regarding the importance of this vaccine. Pt has been advised to call insurance company to determine out of pocket expense. Advised may also receive vaccine at local pharmacy or Health Dept. Verbalized acceptance and understanding.  Tdap: Although this vaccine is not a covered service during a Wellness Exam, does the patient still wish to receive this vaccine today?  No .  Education has been provided regarding the importance of this vaccine. Advised may receive this vaccine at local pharmacy or Health Dept. Aware to provide a copy of the vaccination record if obtained from local pharmacy or Health Dept. Verbalized acceptance and understanding.  Flu Vaccine: due 02/2019  Pneumococcal Vaccine: received about 15 years ago and had a bad reaction. Patient not to receive any more vaccines.   Screening Tests Health Maintenance  Topic Date Due  . HIV Screening  04/11/1973  . TETANUS/TDAP  04/11/1977  . Fecal DNA (Cologuard)  04/11/2008  . MAMMOGRAM  11/08/2019 (Originally 08/25/2015)  . INFLUENZA VACCINE  01/20/2019  . Hepatitis C Screening  Completed  . PAP SMEAR-Modifier  Discontinued    Cancer Screenings:  Colorectal Screening: cologuard ordered last year,   Mammogram: Completed 08/24/2013. Declined mammogram. Completing breast exams yearly.   Bone Density: not indicated   Lung  Cancer Screening: (Low Dose CT Chest recommended if Age 47-80 years, 30 pack-year currently smoking OR have quit w/in 15years.) does not qualify.    Additional Screening:  Hepatitis C Screening: does qualify; Completed 10/13/2017  Vision Screening: Recommended annual ophthalmology exams for early detection of glaucoma and other disorders of the eye. Is the patient up to date with their annual eye exam?  Yes  Who is the provider or what is the name of the office in which the pt attends annual eye exams? [facility]   Dental Screening: Recommended annual dental exams for proper oral hygiene  Community Resource Referral:  CRR required this visit?  No       Plan:  I have personally reviewed and addressed the Medicare Annual Wellness questionnaire and have noted the following in the patient's chart:  A. Medical and social history B. Use of alcohol, tobacco or illicit drugs  C. Current medications and supplements D. Functional ability and status E.  Nutritional status F.  Physical activity G. Advance directives H. List of other physicians I.  Hospitalizations, surgeries, and ER visits in previous 12 months J.  Vitals K. Screenings such as hearing and vision if needed, cognitive and depression L. Referrals and appointments   In addition, I have reviewed and discussed with patient certain preventive protocols, quality metrics, and best practice recommendations. A written personalized care plan for preventive services as well as general preventive health recommendations were provided to patient. Nurse Health Advisor  Signed,    [place], Truddie [place], [place]  09/09/252 Nurse Health Advisor   Nurse Notes: none

## 2018-11-08 NOTE — Patient Instructions (Addendum)
Ms. Veronica Mills , Thank you for taking time to come for your Medicare Wellness Visit. I appreciate your ongoing commitment to your health goals. Please review the following plan we discussed and let me know if I can assist you in the future.   Screening recommendations/referrals: Colonoscopy: cologuard ordered. Please call me at [phone] if you do not hear from [facility] in the next month  Mammogram: declined, continue yearly breast exams with PCP Bone Density: not indicated  Recommended yearly ophthalmology/optometry visit for glaucoma screening and checkup Recommended yearly dental visit for hygiene and checkup  Vaccinations: Influenza vaccine: due 02/2019 Pneumococcal vaccine: not indicated Tdap vaccine: due, check with your insurance company for coverage information Shingles vaccine: shingrix eligible, check with your insurance company for coverage     Advanced directives: please bring a copy to the office if you have one completed, if you need the paperwork to complete this please let us know as we have them in the office.   Conditions/risks identified: none   Next appointment: Follow up in one year for your annual wellness exam.   Preventive Care 40-64 Years, Female Preventive care refers to lifestyle choices and visits with your health care provider that can promote health and wellness. What does preventive care include?  A yearly physical exam. This is also called an annual well check.  Dental exams once or twice a year.  Routine eye exams. Ask your health care provider how often you should have your eyes checked.  Personal lifestyle choices, including:  Daily care of your teeth and gums.  Regular physical activity.  Eating a healthy diet.  Avoiding tobacco and drug use.  Limiting alcohol use.  Practicing safe sex.  Taking low-dose aspirin daily starting at age 47.  Taking vitamin and mineral supplements as recommended by your health care provider. What  happens during an annual well check? The services and screenings done by your health care provider during your annual well check will depend on your age, overall health, lifestyle risk factors, and family history of disease. Counseling  Your health care provider may ask you questions about your:  Alcohol use.  Tobacco use.  Drug use.  Emotional well-being.  Home and relationship well-being.  Sexual activity.  Eating habits.  Work and work Statistician.  Method of birth control.  Menstrual cycle.  Pregnancy history. Screening  You may have the following tests or measurements:  Height, weight, and BMI.  Blood pressure.  Lipid and cholesterol levels. These may be checked every 5 years, or more frequently if you are over 24 years old.  Skin check.  Lung cancer screening. You may have this screening every year starting at age 59 if you have a 30-pack-year history of smoking and currently smoke or have quit within the past 15 years.  Fecal occult blood test (FOBT) of the stool. You may have this test every year starting at age 63.  Flexible sigmoidoscopy or colonoscopy. You may have a sigmoidoscopy every 5 years or a colonoscopy every 10 years starting at age 56.  Hepatitis C blood test.  Hepatitis B blood test.  Sexually transmitted disease (STD) testing.  Diabetes screening. This is done by checking your blood sugar (glucose) after you have not eaten for a while (fasting). You may have this done every 1-3 years.  Mammogram. This may be done every 1-2 years. Talk to your health care provider about when you should start having regular mammograms. This may depend on whether you have a family history  of breast cancer.  BRCA-related cancer screening. This may be done if you have a family history of breast, ovarian, tubal, or peritoneal cancers.  Pelvic exam and Pap test. This may be done every 3 years starting at age 18. Starting at age 74, this may be done every 5 years  if you have a Pap test in combination with an HPV test.  Bone density scan. This is done to screen for osteoporosis. You may have this scan if you are at high risk for osteoporosis. Discuss your test results, treatment options, and if necessary, the need for more tests with your health care provider. Vaccines  Your health care provider may recommend certain vaccines, such as:  Influenza vaccine. This is recommended every year.  Tetanus, diphtheria, and acellular pertussis (Tdap, Td) vaccine. You may need a Td booster every 10 years.  Zoster vaccine. You may need this after age 81.  Pneumococcal 13-valent conjugate (PCV13) vaccine. You may need this if you have certain conditions and were not previously vaccinated.  Pneumococcal polysaccharide (PPSV23) vaccine. You may need one or two doses if you smoke cigarettes or if you have certain conditions. Talk to your health care provider about which screenings and vaccines you need and how often you need them. This information is not intended to replace advice given to you by your health care provider. Make sure you discuss any questions you have with your health care provider. Document Released: 07/04/2015 Document Revised: 02/25/2016 Document Reviewed: 04/08/2015 Elsevier Interactive Patient Education  2017 [place] Prevention in the Home Falls can cause injuries. They can happen to people of all ages. There are many things you can do to make your home safe and to help prevent falls. What can I do on the outside of my home?  Regularly fix the edges of walkways and driveways and fix any cracks.  Remove anything that might make you trip as you walk through a door, such as a raised step or threshold.  Trim any bushes or trees on the path to your home.  Use bright outdoor lighting.  Clear any walking paths of anything that might make someone trip, such as rocks or tools.  Regularly check to see if handrails are loose or  broken. Make sure that both sides of any steps have handrails.  Any raised decks and porches should have guardrails on the edges.  Have any leaves, snow, or ice cleared regularly.  Use sand or salt on walking paths during winter.  Clean up any spills in your garage right away. This includes oil or grease spills. What can I do in the bathroom?  Use night lights.  Install grab bars by the toilet and in the tub and shower. Do not use towel bars as grab bars.  Use non-skid mats or decals in the tub or shower.  If you need to sit down in the shower, use a plastic, non-slip stool.  Keep the floor dry. Clean up any water that spills on the floor as soon as it happens.  Remove soap buildup in the tub or shower regularly.  Attach bath mats securely with double-sided non-slip rug tape.  Do not have throw rugs and other things on the floor that can make you trip. What can I do in the bedroom?  Use night lights.  Make sure that you have a light by your bed that is easy to reach.  Do not use any sheets or blankets that are too big  for your bed. They should not hang down onto the floor.  Have a firm chair that has side arms. You can use this for support while you get dressed.  Do not have throw rugs and other things on the floor that can make you trip. What can I do in the kitchen?  Clean up any spills right away.  Avoid walking on wet floors.  Keep items that you use a lot in easy-to-reach places.  If you need to reach something above you, use a strong step stool that has a grab bar.  Keep electrical cords out of the way.  Do not use floor polish or wax that makes floors slippery. If you must use wax, use non-skid floor wax.  Do not have throw rugs and other things on the floor that can make you trip. What can I do with my stairs?  Do not leave any items on the stairs.  Make sure that there are handrails on both sides of the stairs and use them. Fix handrails that are  broken or loose. Make sure that handrails are as long as the stairways.  Check any carpeting to make sure that it is firmly attached to the stairs. Fix any carpet that is loose or worn.  Avoid having throw rugs at the top or bottom of the stairs. If you do have throw rugs, attach them to the floor with carpet tape.  Make sure that you have a light switch at the top of the stairs and the bottom of the stairs. If you do not have them, ask someone to add them for you. What else can I do to help prevent falls?  Wear shoes that:  Do not have high heels.  Have rubber bottoms.  Are comfortable and fit you well.  Are closed at the toe. Do not wear sandals.  If you use a stepladder:  Make sure that it is fully opened. Do not climb a closed stepladder.  Make sure that both sides of the stepladder are locked into place.  Ask someone to hold it for you, if possible.  Clearly mark and make sure that you can see:  Any grab bars or handrails.  First and last steps.  Where the edge of each step is.  Use tools that help you move around (mobility aids) if they are needed. These include:  Canes.  Walkers.  Scooters.  Crutches.  Turn on the lights when you go into a dark area. Replace any light bulbs as soon as they burn out.  Set up your furniture so you have a clear path. Avoid moving your furniture around.  If any of your floors are uneven, fix them.  If there are any pets around you, be aware of where they are.  Review your medicines with your doctor. Some medicines can make you feel dizzy. This can increase your chance of falling. Ask your doctor what other things that you can do to help prevent falls. This information is not intended to replace advice given to you by your health care provider. Make sure you discuss any questions you have with your health care provider. Document Released: 04/03/2009 Document Revised: 11/13/2015 Document Reviewed: 07/12/2014 Elsevier  Interactive Patient Education  2017 [employer].

## 2018-11-09 DIAGNOSIS — R69 Illness, unspecified: Secondary | ICD-10-CM | POA: Diagnosis not present

## 2018-11-09 LAB — VITAMIN D 25 HYDROXY (VIT D DEFICIENCY, FRACTURES): Vit D, 25-Hydroxy: 38.2 ng/mL (ref 30.0–100.0)

## 2018-11-09 LAB — HEMOGLOBIN A1C
Est. average glucose Bld gHb Est-mCnc: 126 mg/dL
Hgb A1c MFr Bld: 6 % — ABNORMAL HIGH (ref 4.8–5.6)

## 2018-11-10 ENCOUNTER — Encounter: Payer: Self-pay | Admitting: Family Medicine

## 2018-11-10 ENCOUNTER — Other Ambulatory Visit: Payer: Self-pay

## 2018-11-10 ENCOUNTER — Ambulatory Visit (INDEPENDENT_AMBULATORY_CARE_PROVIDER_SITE_OTHER): Payer: Medicare HMO | Admitting: Family Medicine

## 2018-11-10 VITALS — BP 130/75 | HR 67 | Temp 98.4°F | Ht 64.0 in | Wt 170.0 lb

## 2018-11-10 DIAGNOSIS — R7301 Impaired fasting glucose: Secondary | ICD-10-CM

## 2018-11-10 DIAGNOSIS — G4733 Obstructive sleep apnea (adult) (pediatric): Secondary | ICD-10-CM

## 2018-11-10 DIAGNOSIS — R69 Illness, unspecified: Secondary | ICD-10-CM | POA: Diagnosis not present

## 2018-11-10 DIAGNOSIS — G4730 Sleep apnea, unspecified: Secondary | ICD-10-CM | POA: Insufficient documentation

## 2018-11-10 DIAGNOSIS — R232 Flushing: Secondary | ICD-10-CM | POA: Diagnosis not present

## 2018-11-10 DIAGNOSIS — F5104 Psychophysiologic insomnia: Secondary | ICD-10-CM

## 2018-11-10 MED ORDER — GABAPENTIN 600 MG PO TABS: 600.0000 mg | ORAL_TABLET | Freq: Three times a day (TID) | ORAL | 2 refills | Status: DC | PRN

## 2018-11-10 NOTE — Progress Notes (Signed)
BP 130/75   Pulse 67   Temp 98.4 F (36.9 C) (Oral)   Ht 5\' 4"  (1.626 m)   Wt 170 lb (77.1 kg)   SpO2 98%   BMI 29.18 kg/m    Subjective:    Patient ID: Veronica Mills, female    DOB: Jul 09, 1955, 63 y.o.   MRN: [id number]  HPI: Veronica Mills is a 63 y.o. female  Chief Complaint  Patient presents with  . Impaired Fasting Glucose    f/u pt would like to discuss about her vitamin D labs   Here today for 3 month f/u IFG and vit D deficiency. Feels increasing her vitamin D has helped significantly with her moods and energy levels. Went to Endocrinology to discuss her thyroid condition and discussed her mildly elevated A1C. Specialist recommends continued diet control at this point, with A1c holding at 6.0.   Taking gabapentin 1200 mg nightly for hot flashes, previously under good control with this but now having lots of break through sxs.   Was diagnosed with OSA, not doing CPAP but working with a dentist on a device that opens your jaw up to help with nighttime breathing. Hoping this helps her chronic insomnia.   Relevant past medical, surgical, family and social history reviewed and updated as indicated. Interim medical history since our last visit reviewed. Allergies and medications reviewed and updated.  Review of Systems  Per HPI unless specifically indicated above     Objective:    BP 130/75   Pulse 67   Temp 98.4 F (36.9 C) (Oral)   Ht 5\' 4"  (1.626 m)   Wt 170 lb (77.1 kg)   SpO2 98%   BMI 29.18 kg/m   Wt Readings from Last 3 Encounters:  11/10/18 170 lb (77.1 kg)  08/30/18 170 lb (77.1 kg)  08/07/18 170 lb 5 oz (77.3 kg)    Physical Exam Vitals signs and nursing note reviewed.  Constitutional:      Appearance: Normal appearance. She is not ill-appearing.  HENT:     Head: Atraumatic.  Eyes:     Extraocular Movements: Extraocular movements intact.     Conjunctiva/sclera: Conjunctivae normal.  Neck:     Musculoskeletal: Normal range of motion and neck  supple.  Cardiovascular:     Rate and Rhythm: Normal rate and regular rhythm.     Heart sounds: Normal heart sounds.  Pulmonary:     Effort: Pulmonary effort is normal.     Breath sounds: Normal breath sounds.  Musculoskeletal: Normal range of motion.  Skin:    General: Skin is warm and dry.  Neurological:     Mental Status: She is alert and oriented to person, place, and time.  Psychiatric:        Mood and Affect: Mood normal.        Thought Content: Thought content normal.        Judgment: Judgment normal.     Results for orders placed or performed in visit on 11/08/18  HgB A1c  Result Value Ref Range   Hgb A1c MFr Bld 6.0 (H) 4.8 - 5.6 %   Est. average glucose Bld gHb Est-mCnc 126 mg/dL  VITAMIN D 25 Hydroxy (Vit-D Deficiency, Fractures)  Result Value Ref Range   Vit D, 25-Hydroxy 38.2 30.0 - 100.0 ng/mL      Assessment & Plan:   Problem List Items Addressed This Visit      Respiratory   Sleep apnea    dx'd recently via sleep  study. Unable to do CPAP due to claustrophobia concerns, working with dentist on a new device to help. Continue to monitor for sleep improvement        Endocrine   IFG (impaired fasting glucose) - Primary    A1C stable at 6.0. Patient working with Endocrinology for thyroid issues who discussed with her that diet control is sufficient at this time for her blood sugars. Patient agreeable to continued dietary control. Recheck A1C in 6 months        Other   Chronic insomnia (Chronic)    Recent dx of OSA. Continue to monitor as she gets new device to help with sxs       Other Visit Diagnoses    Hot flashes       Increase gabapentin to 3 capsules daily as needed and continue to monitor       Follow up plan: Return in about 6 months (around 05/13/2019) for 6 month f/u.

## 2018-11-10 NOTE — Assessment & Plan Note (Signed)
dx'd recently via sleep study. Unable to do CPAP due to claustrophobia concerns, working with dentist on a new device to help. Continue to monitor for sleep improvement

## 2018-11-10 NOTE — Assessment & Plan Note (Signed)
A1C stable at 6.0. Patient working with Endocrinology for thyroid issues who discussed with her that diet control is sufficient at this time for her blood sugars. Patient agreeable to continued dietary control. Recheck A1C in 6 months

## 2018-11-10 NOTE — Assessment & Plan Note (Signed)
Recent dx of OSA. Continue to monitor as she gets new device to help with sxs

## 2018-11-26 DIAGNOSIS — R69 Illness, unspecified: Secondary | ICD-10-CM | POA: Diagnosis not present

## 2018-11-27 DIAGNOSIS — Z1211 Encounter for screening for malignant neoplasm of colon: Secondary | ICD-10-CM | POA: Diagnosis not present

## 2018-11-27 LAB — COLOGUARD: Cologuard: NEGATIVE

## 2018-12-04 DIAGNOSIS — G4733 Obstructive sleep apnea (adult) (pediatric): Secondary | ICD-10-CM | POA: Diagnosis not present

## 2018-12-15 ENCOUNTER — Other Ambulatory Visit: Payer: Self-pay | Admitting: Family Medicine

## 2018-12-15 NOTE — Telephone Encounter (Signed)
Requested medication (s) are due for refill today: no  Requested medication (s) are on the active medication list: ye  Last refill:  11/15/2018  Future visit scheduled: yes  Notes to clinic:  Requesting 90 day prescription which is already ordered   Requested Prescriptions  Pending Prescriptions Disp Refills   gabapentin (NEURONTIN) 600 MG tablet [Pharmacy Med Name: GABAPENTIN 600 MG TABLET] 270 tablet 1    Sig: Take 1 tablet (600 mg total) by mouth 3 (three) times daily as needed.     Neurology: Anticonvulsants - gabapentin Passed - 12/15/2018 11:34 AM      Passed - Valid encounter within last 12 months    Recent Outpatient Visits          1 month ago IFG (impaired fasting glucose)   [facility], [place], [place]   4 months ago Insomnia, unspecified type   [place], [place], [place]   4 months ago Acquired hypothyroidism   [facility] Merrie Roof [place], [place]   10 months ago Acute bacterial conjunctivitis of left eye   [facility] Volney American, [place]   1 year ago Encounter for [employer] annual wellness exam   [place], Lilia Argue, [place]      Future Appointments            In 5 months Orene Desanctis, Lilia Argue, [place], [place]

## 2019-01-22 ENCOUNTER — Other Ambulatory Visit: Payer: Self-pay | Admitting: Family Medicine

## 2019-01-22 NOTE — Telephone Encounter (Signed)
Requested Prescriptions  Pending Prescriptions Disp Refills  . progesterone (PROMETRIUM) 100 MG capsule [Pharmacy Med Name: PROGESTERONE 100 MG CAPSULE] 90 capsule 1    Sig: TAKE 1 CAPSULE BY MOUTH EVERY DAY     OB/GYN:  Progestins Passed - 01/22/2019  8:47 AM      Passed - Valid encounter within last 12 months    Recent Outpatient Visits          2 months ago IFG (impaired fasting glucose)   [facility], [place], [place]   5 months ago Insomnia, unspecified type   [place], [place], [place]   6 months ago Acquired hypothyroidism   [facility] Merrie Roof [place], [place]   11 months ago Acute bacterial conjunctivitis of left eye   [facility] Volney American, [place]   1 year ago Encounter for [employer] annual wellness exam   [place], Lilia Argue, [place]      Future Appointments            In 4 months Orene Desanctis, Lilia Argue, [place], [place]

## 2019-01-24 ENCOUNTER — Other Ambulatory Visit: Payer: Self-pay | Admitting: Family Medicine

## 2019-01-24 NOTE — Telephone Encounter (Signed)
Requested Prescriptions  Pending Prescriptions Disp Refills  . NATURE-THROID 32.5 MG tablet [Pharmacy Med Name: NATURE-THROID 0.5GR (32.5MG ) TABS] 90 tablet 1    Sig: TAKE 1 TABLET BY MOUTH DAILY     Endocrinology:  Hypothyroid Agents Failed - 01/24/2019 12:27 PM      Failed - TSH needs to be rechecked within 3 months after an abnormal result. Refill until TSH is due.      Passed - TSH in normal range and within 360 days    TSH  Date Value Ref Range Status  07/25/2018 1.690 0.450 - 4.500 uIU/mL Final         Passed - Valid encounter within last 12 months    Recent Outpatient Visits          2 months ago IFG (impaired fasting glucose)   [facility], [place], [place]   5 months ago Insomnia, unspecified type   [place], [place], [place]   6 months ago Acquired hypothyroidism   [place], [place], [place]   11 months ago Acute bacterial conjunctivitis of left eye   [facility] Volney American, [place]   1 year ago Encounter for [employer] annual wellness exam   [place], Lilia Argue, [place]      Future Appointments            In 4 months Orene Desanctis, Lilia Argue, [place], [place]

## 2019-01-29 DIAGNOSIS — R69 Illness, unspecified: Secondary | ICD-10-CM | POA: Diagnosis not present

## 2019-02-26 DIAGNOSIS — R69 Illness, unspecified: Secondary | ICD-10-CM | POA: Diagnosis not present

## 2019-03-07 ENCOUNTER — Encounter: Payer: Self-pay | Admitting: Family Medicine

## 2019-03-09 ENCOUNTER — Other Ambulatory Visit: Payer: Self-pay | Admitting: Family Medicine

## 2019-03-09 DIAGNOSIS — E039 Hypothyroidism, unspecified: Secondary | ICD-10-CM

## 2019-03-09 MED ORDER — THYROID 30 MG PO TABS: 30.0000 mg | ORAL_TABLET | Freq: Every day | ORAL | 1 refills | Status: DC

## 2019-03-12 ENCOUNTER — Other Ambulatory Visit: Payer: Self-pay | Admitting: Family Medicine

## 2019-03-12 DIAGNOSIS — R69 Illness, unspecified: Secondary | ICD-10-CM | POA: Diagnosis not present

## 2019-03-12 MED ORDER — THYROID 30 MG PO TABS: 30.0000 mg | ORAL_TABLET | Freq: Every day | ORAL | 1 refills | Status: DC

## 2019-04-08 DIAGNOSIS — R69 Illness, unspecified: Secondary | ICD-10-CM | POA: Diagnosis not present

## 2019-04-11 ENCOUNTER — Other Ambulatory Visit: Payer: Self-pay | Admitting: Neurology

## 2019-04-11 DIAGNOSIS — G4733 Obstructive sleep apnea (adult) (pediatric): Secondary | ICD-10-CM

## 2019-05-07 ENCOUNTER — Ambulatory Visit (INDEPENDENT_AMBULATORY_CARE_PROVIDER_SITE_OTHER): Payer: Medicare HMO | Admitting: Neurology

## 2019-05-07 DIAGNOSIS — R0683 Snoring: Secondary | ICD-10-CM

## 2019-05-07 DIAGNOSIS — G4733 Obstructive sleep apnea (adult) (pediatric): Secondary | ICD-10-CM

## 2019-05-07 DIAGNOSIS — G4700 Insomnia, unspecified: Secondary | ICD-10-CM

## 2019-05-17 DIAGNOSIS — R69 Illness, unspecified: Secondary | ICD-10-CM | POA: Diagnosis not present

## 2019-05-21 ENCOUNTER — Other Ambulatory Visit: Payer: Self-pay

## 2019-05-21 MED ORDER — THYROID 30 MG PO TABS: 30.0000 mg | ORAL_TABLET | Freq: Every day | ORAL | 1 refills | Status: DC

## 2019-05-21 NOTE — Telephone Encounter (Signed)
Patient has f/up 05/25/19.

## 2019-05-22 DIAGNOSIS — G4733 Obstructive sleep apnea (adult) (pediatric): Secondary | ICD-10-CM | POA: Insufficient documentation

## 2019-05-22 DIAGNOSIS — G4700 Insomnia, unspecified: Secondary | ICD-10-CM | POA: Insufficient documentation

## 2019-05-22 DIAGNOSIS — R0683 Snoring: Secondary | ICD-10-CM | POA: Insufficient documentation

## 2019-05-22 NOTE — Addendum Note (Signed)
Addended by: Larey Seat on: 05/22/2019 12:56 PM   Modules accepted: Orders

## 2019-05-22 NOTE — Procedures (Signed)
Patient Information     First Name: Veronica Last Name: Mills ID: [id number]  Birth Date: [birth year] Age: 63 Gender: Female  Referring Provider: Particia Nearing, PA-C BMI: 29.0 (W=170 lb, H=5' 4'')  Neck Circ.:  15 '' Epworth:  0/24   Sleep Study Information    Study Date: May 07, 2019 S/H/A Version: [id number] / [id number] / 67  History:     08-30-2018 Mrs. Veronica Mills is a 63 year old Caucasian female patient of the [facility] with impaired fasting glucose, has also known thyroid disease, and a known congenital bowel abnormality.  She tested low for vitamin D, and she has filled a 50000-unit prescription and is NOT taking it. She has hot flushes at time. She a congenital deformity of the right hand.   She has noted snoring, insomnia and increasing daytime somnolence but endorsed the EDS at 0 points. She" just can't nap" and is aware of snoring loudly at night but her main problem is to initiate sleep. The patient will also sleep some nights wake up between 3 and 4 AM, this is not related to the urge to urinate. She does not think that snoring may contribute to these arousals, discomfort or pain may however contribute.  She does not recall dreaming or at least not lately. Between 630 and 730 is her rise time, a spontaneous arousal. She doesn't work outside the home.    Summary & Diagnosis:    Mild obstructive sleep apnea at AHI 6.6/h was noted and associated with moderate - loud snoring. REM AHI was 11/h. Accordingly, the RDI was 11.5/h and highest in REM at 17/h.  There was no cardiac rhythm abnormality, no prolonged sleep related hypoxia.  This HST allows for an estimated sleep architecture and documented a high amount of REM sleep. Sleep after midnight was not unusually fragmented. REM sleep is present between 2-3 and 4-5 AM and may explain her arousals at that time.   Recommendations:     I would treat this mild OSA with CPAP , but a dental device is also possible as her apnea  form is accentuated by REM sleep, but not REM dependent.   Interpreting Physician: Melvyn Novas, MD    Sleep Summary  Oxygen Saturation Statistics   Start Study Time: End Study Time: Total Recording Time:  10:33:23 PM         6:51:47 AM   8 h, 18 min  Total Sleep Time % REM of Sleep Time:  7 h, 2 min  33.9    Mean: 94 Minimum: 85 Maximum: 99  Mean of Desaturations Nadirs (%):   93  Oxygen Desaturation. %:   4-9 10-20 >20 Total  Events Number Total    20  1 95.2 4.8  0 0.0  21 100.0  Oxygen Saturation: <90 <=88 <85 <80 <70  Duration (minutes): Sleep % 0.0 0.0  0.0 0.0  0.0 0.0 0.0 0.0 0.0 0.0     Respiratory Indices      Total Events REM NREM All Night  pRDI:  81 pAHI:  46 ODI:  21  pAHIc:  1  % CSR: 0.0 17.6 10.9 3.8 0.4 8.4 4.3 2.6 0.0 11.5 6.6 3.0 0.1       Pulse Rate Statistics during Sleep (BPM)      Mean: 55 Minimum: 41 Maximum: 104    Indices are calculated using technically valid sleep time of  7 h, 1 min. pRDI/pAHI are calculated using oxi desaturations ? 3%  Body  Position Statistics  Position Supine Prone Right Left Non-Supine  Sleep (min) 287.5 6.0 110.5 18.4 134.9  Sleep % 68.1 1.4 26.2 4.4 31.9  pRDI 10.5 N/A 10.3 33.0 13.8  pAHI 5.4 N/A 7.6 13.2 8.9  ODI 1.9 N/A 3.8 13.2 5.4     Snoring Statistics Snoring Level (dB) >40 >50 >60 >70 >80 >Threshold (45)  Sleep (min) 296.1 5.0 1.5 0.0 0.0 14.1  Sleep % 70.1 1.2 0.4 0.0 0.0 3.3    Mean: 41 dB Sleep Stages Chart

## 2019-05-23 ENCOUNTER — Encounter: Payer: Self-pay | Admitting: Neurology

## 2019-05-23 NOTE — Addendum Note (Signed)
Addended by: Larey Seat on: 05/23/2019 12:09 PM   Modules accepted: Orders

## 2019-05-23 NOTE — Telephone Encounter (Signed)
Dear Mrs. Mckethan. The HST had not been marked as you using the dental device- I am sorry for not having reported it as such.  AHI of 6-7/h would be such mild apnea, that I wouldn't think you need a different therapy or intervention. AHI may still be improving if dental device can be titrated further. Overall, there has been a 2/3 reduction in AHI from 17.8/h to the current level.   Also noted was a higher REM sleep proportion, now 33.9% under the dental treatment. That speaks for more restorative sleep.   Larey Seat, MD   Cc Dr Augustina Mood, DDS

## 2019-05-23 NOTE — Patient Instructions (Signed)
Dear Mrs. Mcniel. The HST had not been marked as you using the dental device- I am sorry for not having reported it as such.  AHI of 6-7/h would be such mild apnea, that I wouldn't think you need a different therapy or intervention. AHI may still be improving if dental device can be titrated further. Overall, there has been a 2/3 reduction in AHI from 17.8/h to the current level.   Also noted was a higher REM sleep proportion, now 33.9% under the dental treatment. That speaks for more restorative sleep.   Kazim Corrales, MD   Cc Dr Fuller, Sandra, DDS 

## 2019-05-25 ENCOUNTER — Encounter: Payer: Self-pay | Admitting: Family Medicine

## 2019-05-25 ENCOUNTER — Ambulatory Visit (INDEPENDENT_AMBULATORY_CARE_PROVIDER_SITE_OTHER): Payer: Medicare HMO | Admitting: Family Medicine

## 2019-05-25 ENCOUNTER — Other Ambulatory Visit: Payer: Self-pay

## 2019-05-25 VITALS — BP 125/70 | HR 62 | Temp 97.9°F | Ht 64.0 in | Wt 173.0 lb

## 2019-05-25 DIAGNOSIS — E785 Hyperlipidemia, unspecified: Secondary | ICD-10-CM | POA: Diagnosis not present

## 2019-05-25 DIAGNOSIS — R7301 Impaired fasting glucose: Secondary | ICD-10-CM | POA: Diagnosis not present

## 2019-05-25 DIAGNOSIS — E039 Hypothyroidism, unspecified: Secondary | ICD-10-CM

## 2019-05-25 DIAGNOSIS — F5104 Psychophysiologic insomnia: Secondary | ICD-10-CM

## 2019-05-25 DIAGNOSIS — R69 Illness, unspecified: Secondary | ICD-10-CM | POA: Diagnosis not present

## 2019-05-25 MED ORDER — GABAPENTIN 600 MG PO TABS: 600.0000 mg | ORAL_TABLET | Freq: Three times a day (TID) | ORAL | 1 refills | Status: DC | PRN

## 2019-05-25 MED ORDER — THYROID 30 MG PO TABS: 30.0000 mg | ORAL_TABLET | Freq: Every day | ORAL | 1 refills | Status: DC

## 2019-05-25 NOTE — Progress Notes (Signed)
BP 125/70   Pulse 62   Temp 97.9 F (36.6 C) (Temporal)   Ht 5\' 4"  (1.626 m)   Wt 173 lb (78.5 kg)   BMI 29.70 kg/m    Subjective:    Patient ID: Veronica Mills, female    DOB: 1955-12-21, 63 y.o.   MRN: [id number]  HPI: [name] is a 63 y.o. female  Chief Complaint  Patient presents with  . Impired Fasting Glucose    68m f/u  . Hypothyroidism    . This visit was completed via WebEx due to the restrictions of the COVID-19 pandemic. All issues as above were discussed and addressed. Physical exam was done as above through visual confirmation on WebEx. If it was felt that the patient should be evaluated in the office, they were directed there. The patient verbally consented to this visit. . Location of the patient: home . Location of the provider: work . Those involved with this call:  . Provider: Merrie Roof, PA-C . CMA: Lesle Chris, [place] . Front Desk/Registration: Jill Side  . Time spent on call: 15 minutes with patient face to face via video conference. More than 50% of this time was spent in counseling and coordination of care. 5 minutes total spent in review of patient's record and preparation of their chart.  I verified patient identity using two factors (patient name and date of birth). Patient consents verbally to being seen via telemedicine visit today.   Patient presenting today for 6 month f/u chronic conditions.   IFG - diet controlled, exercising as able and eating a healthy diet. Denies symptoms.   HLD - also diet controlled. Denies CP, SOB, claudication.   Insomnia, OSA - Following with sleep specialist, using mouth guard which may help some. Also has been on progesterone and gabapentin for sleep for years with good benefit.   Hypothyroid - Has been stable on current medication for several years. Asymptomatic, taking faithfully wtihout side effects.   Relevant past medical, surgical, family and social history reviewed and updated as indicated. Interim  medical history since our last visit reviewed. Allergies and medications reviewed and updated.  Review of Systems  Per HPI unless specifically indicated above     Objective:    BP 125/70   Pulse 62   Temp 97.9 F (36.6 C) (Temporal)   Ht 5\' 4"  (1.626 m)   Wt 173 lb (78.5 kg)   BMI 29.70 kg/m   Wt Readings from Last 3 Encounters:  05/25/19 173 lb (78.5 kg)  11/10/18 170 lb (77.1 kg)  08/30/18 170 lb (77.1 kg)    Physical Exam Vitals signs and nursing note reviewed.  Constitutional:      General: She is not in acute distress.    Appearance: Normal appearance.  HENT:     Head: Atraumatic.     Right Ear: External ear normal.     Left Ear: External ear normal.     Nose: Nose normal. No congestion.     Mouth/Throat:     Mouth: Mucous membranes are moist.     Pharynx: Oropharynx is clear. No posterior oropharyngeal erythema.  Eyes:     Extraocular Movements: Extraocular movements intact.     Conjunctiva/sclera: Conjunctivae normal.  Neck:     Musculoskeletal: Normal range of motion.  Cardiovascular:     Comments: Unable to assess via virtual visit Pulmonary:     Effort: Pulmonary effort is normal. No respiratory distress.  Musculoskeletal: Normal range of motion.  Skin:  General: Skin is dry.     Findings: No erythema.  Neurological:     Mental Status: She is alert and oriented to person, place, and time.  Psychiatric:        Mood and Affect: Mood normal.        Thought Content: Thought content normal.        Judgment: Judgment normal.     Results for orders placed or performed in visit on 11/08/18  HgB A1c  Result Value Ref Range   Hgb A1c MFr Bld 6.0 (H) 4.8 - 5.6 %   Est. average glucose Bld gHb Est-mCnc 126 mg/dL  VITAMIN D 25 Hydroxy (Vit-D Deficiency, Fractures)  Result Value Ref Range   Vit D, 25-Hydroxy 38.2 30.0 - 100.0 ng/mL      Assessment & Plan:   Problem List Items Addressed This Visit      Endocrine   Hypothyroid - Primary    Recheck  TSH once patient feels comfortable to come in for labs. Continue current regimen in meantime      Relevant Medications   thyroid (ARMOUR THYROID) 30 MG tablet   Other Relevant Orders   TSH   IFG (impaired fasting glucose)    Recheck A1C, pt does not feel comfortable coming in for labs at this time due to pandemic but will come for labs when able. Continue good lifestyle habits for control      Relevant Orders   HgB A1c     Other   Chronic insomnia (Chronic)    Continue working with sleep specialist and current regimen      Hyperlipemia    Recheck lipids when able. Continue lifestyle modifications      Relevant Orders   Lipid Panel w/o Chol/HDL Ratio out       Follow up plan: Return in about 6 months (around 11/23/2019) for CPE.

## 2019-05-28 NOTE — Assessment & Plan Note (Signed)
Recheck TSH once patient feels comfortable to come in for labs. Continue current regimen in meantime

## 2019-05-28 NOTE — Assessment & Plan Note (Signed)
Continue working with sleep specialist and current regimen

## 2019-05-28 NOTE — Assessment & Plan Note (Signed)
Recheck A1C, pt does not feel comfortable coming in for labs at this time due to pandemic but will come for labs when able. Continue good lifestyle habits for control

## 2019-05-28 NOTE — Assessment & Plan Note (Signed)
Recheck lipids when able. Continue lifestyle modifications

## 2019-06-12 ENCOUNTER — Other Ambulatory Visit: Payer: Self-pay | Admitting: Family Medicine

## 2019-06-18 ENCOUNTER — Encounter: Payer: Self-pay | Admitting: Family Medicine

## 2019-06-18 ENCOUNTER — Telehealth: Payer: Self-pay | Admitting: Family Medicine

## 2019-06-18 NOTE — Telephone Encounter (Signed)
Called pt to schedule 6 month CPE, no answer, sent letter.

## 2019-06-19 DIAGNOSIS — R69 Illness, unspecified: Secondary | ICD-10-CM | POA: Diagnosis not present

## 2019-07-24 DIAGNOSIS — R69 Illness, unspecified: Secondary | ICD-10-CM | POA: Diagnosis not present

## 2019-08-25 ENCOUNTER — Other Ambulatory Visit: Payer: Self-pay | Admitting: Family Medicine

## 2019-08-25 NOTE — Telephone Encounter (Signed)
Requested Prescriptions  Pending Prescriptions Disp Refills  . ONETOUCH ULTRA test strip [employer] Med Name: ONE TOUCH ULTRA BLUE TEST STRP] 100 strip 12    Sig: USE AS INSTRUCTED     Endocrinology: Diabetes - Testing Supplies Passed - 08/25/2019  9:30 AM      Passed - Valid encounter within last 12 months    Recent Outpatient Visits          3 months ago Acquired hypothyroidism   [facility] Roosvelt Maser [place], [place]   9 months ago IFG (impaired fasting glucose)   [facility], [place], [place]   1 year ago Insomnia, unspecified type   [facility] Particia Nearing, [place]   1 year ago Acquired hypothyroidism   [facility] Roosvelt Maser [place], [place]   1 year ago Acute bacterial conjunctivitis of left eye   [facility] Particia Nearing, [place]      Future Appointments            In 2 months  [facility]   In 3 months Maurice March, Salley Hews, PA-C [employer], [facility]

## 2019-09-02 DIAGNOSIS — L089 Local infection of the skin and subcutaneous tissue, unspecified: Secondary | ICD-10-CM | POA: Diagnosis not present

## 2019-09-07 ENCOUNTER — Ambulatory Visit: Payer: Medicare HMO | Attending: Internal Medicine

## 2019-09-07 DIAGNOSIS — Z23 Encounter for immunization: Secondary | ICD-10-CM

## 2019-09-07 NOTE — Progress Notes (Signed)
   Covid-19 Vaccination Clinic  Name:  Hamna Asa    MRN: [id number] DOB: [birth year]  09/07/2019  Ms. Hankerson was observed post Covid-19 immunization for 15 minutes without incident. She was provided with Vaccine Information Sheet and instruction to access the V-Safe system.   Ms. Frediani was instructed to call 911 with any severe reactions post vaccine: Marland Kitchen Difficulty breathing  . Swelling of face and throat  . A fast heartbeat  . A bad rash all over body  . Dizziness and weakness   Immunizations Administered    Name Date Dose VIS Date Route   Pfizer COVID-19 Vaccine 09/07/2019  9:58 AM 0.3 mL 06/01/2019 Intramuscular   Manufacturer: [employer]   Lot: [id number]   NDC: [address]-0

## 2019-10-02 ENCOUNTER — Ambulatory Visit: Payer: Medicare HMO | Attending: Internal Medicine

## 2019-10-02 DIAGNOSIS — Z23 Encounter for immunization: Secondary | ICD-10-CM

## 2019-10-02 NOTE — Progress Notes (Signed)
   Covid-19 Vaccination Clinic  Name:  Veronica Mills    MRN: [id number] DOB: 1955-08-25  10/02/2019  Veronica Mills was observed post Covid-19 immunization for 30 minutes based on pre-vaccination screening without incident. She was provided with Vaccine Information Sheet and instruction to access the V-Safe system.   Veronica Mills was instructed to call 911 with any severe reactions post vaccine: Marland Kitchen Difficulty breathing  . Swelling of face and throat  . A fast heartbeat  . A bad rash all over body  . Dizziness and weakness   Immunizations Administered    Name Date Dose VIS Date Route   Pfizer COVID-19 Vaccine 10/02/2019 10:59 AM 0.3 mL 06/01/2019 Intramuscular   Manufacturer: [employer]   Lot: [id number]   NDC: [address]-4

## 2019-10-17 IMAGING — US US THYROID
1 series · 14 of 25 positions shown · non-contrast
Comparison: None.

CLINICAL DATA: History of thyroid medications. Question thyroid
nodule.

EXAM:
THYROID ULTRASOUND
TECHNIQUE: Ultrasound examination of the thyroid gland and adjacent soft
tissues was performed.

[Series 1: us thyroid · 0.07mm/px · 14 of 41 slices shown]
[im 1/41]
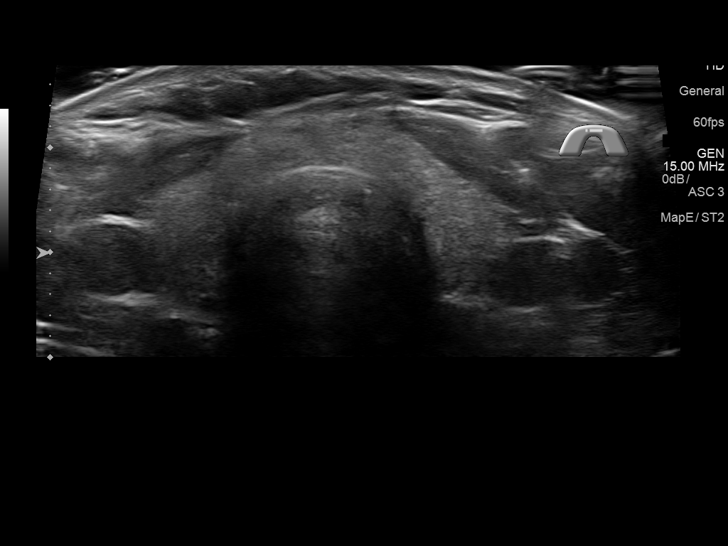
[im 4/41]
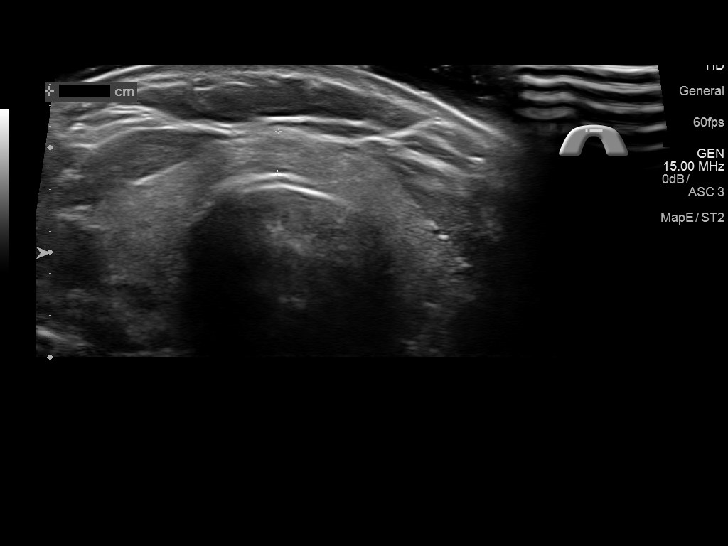
[im 7/41]
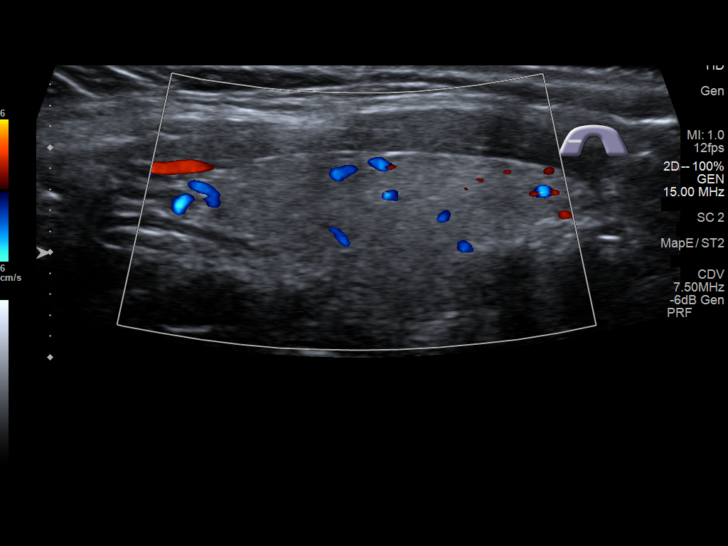
[im 11/41]
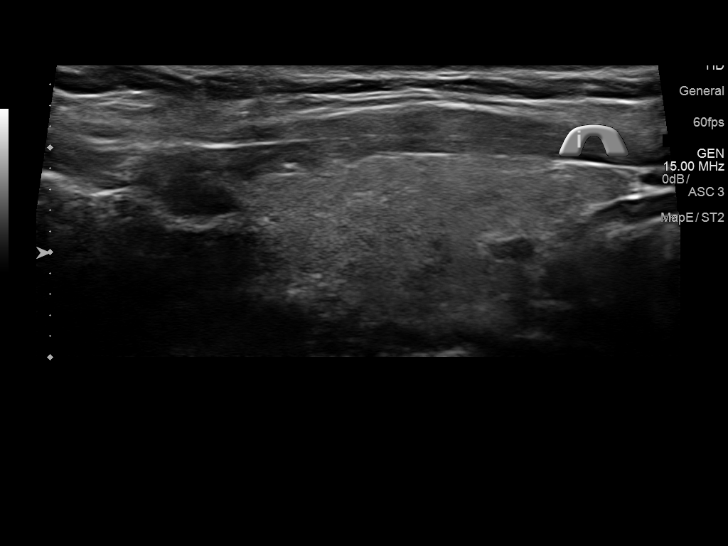
[im 14/41]
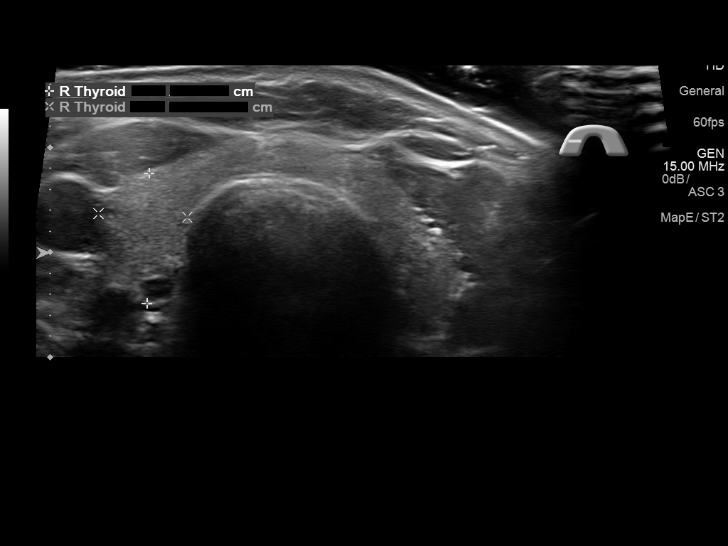
[im 16/41]
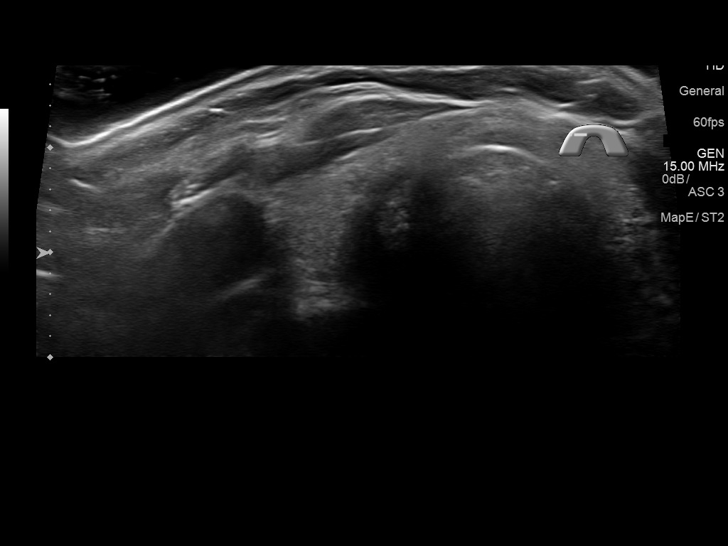
[im 19/41]
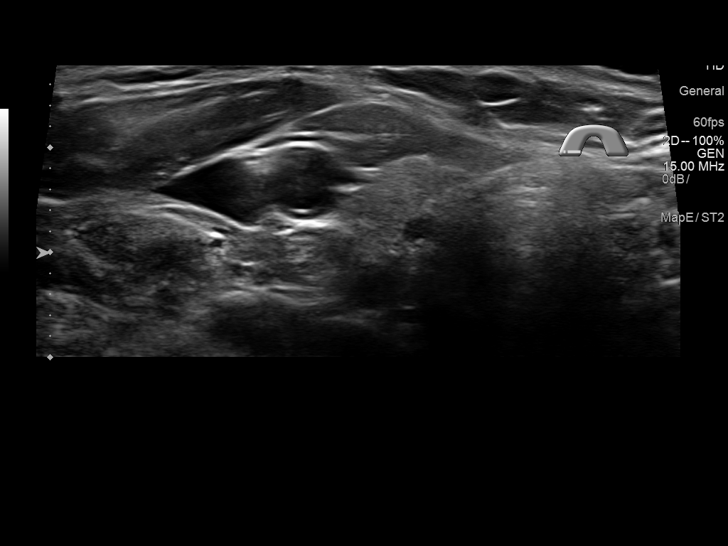
[im 22/41]
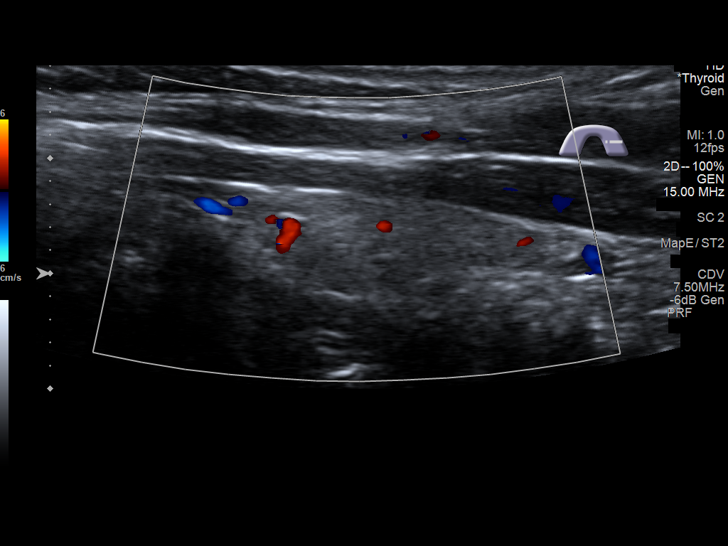
[im 26/41]
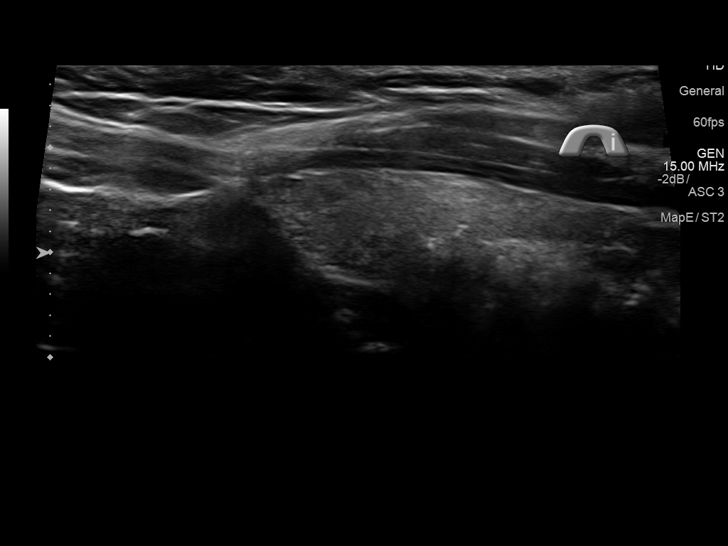
[im 27/41]
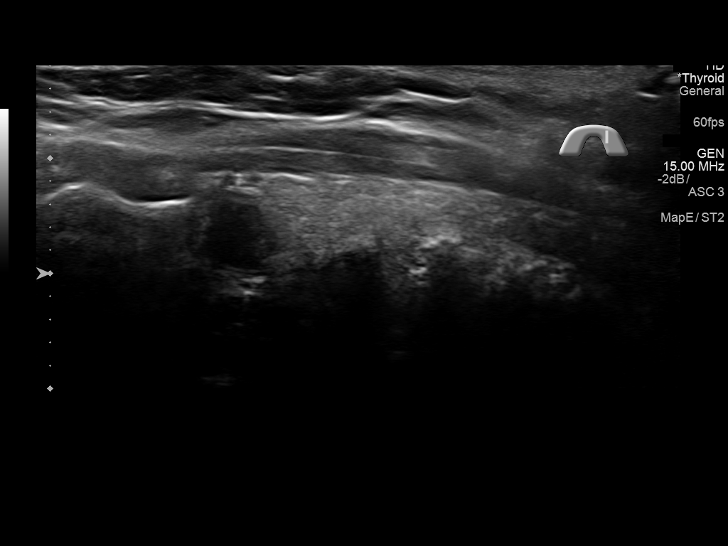
[im 31/41]
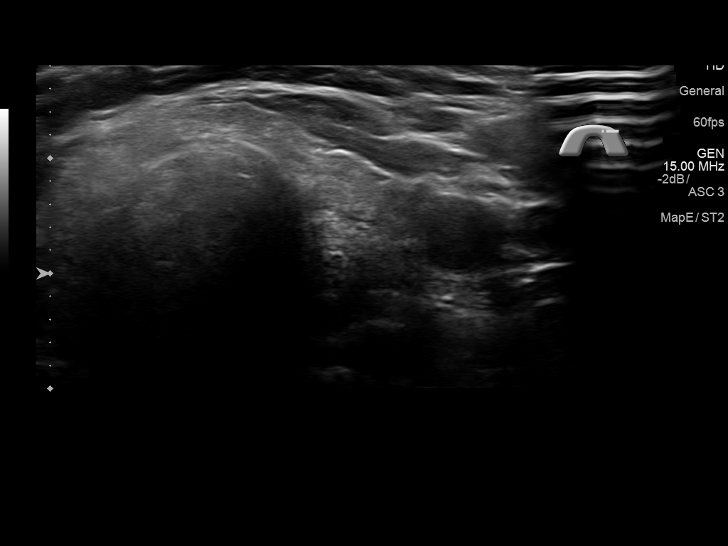
[im 34/41]
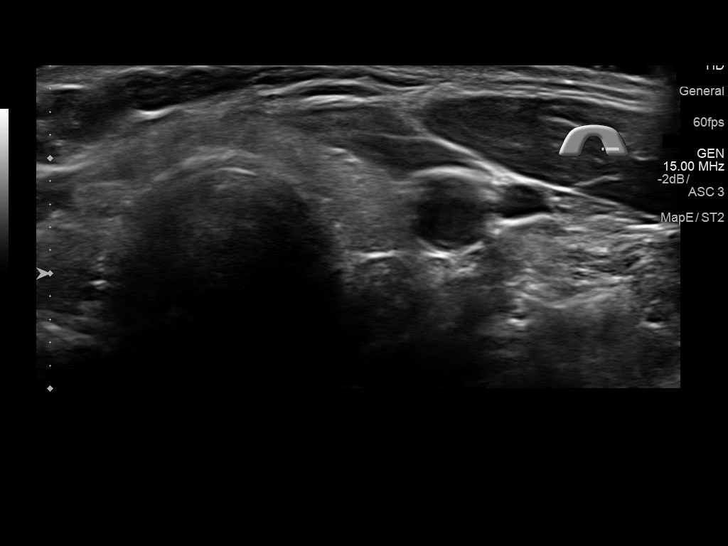
[im 37/41]
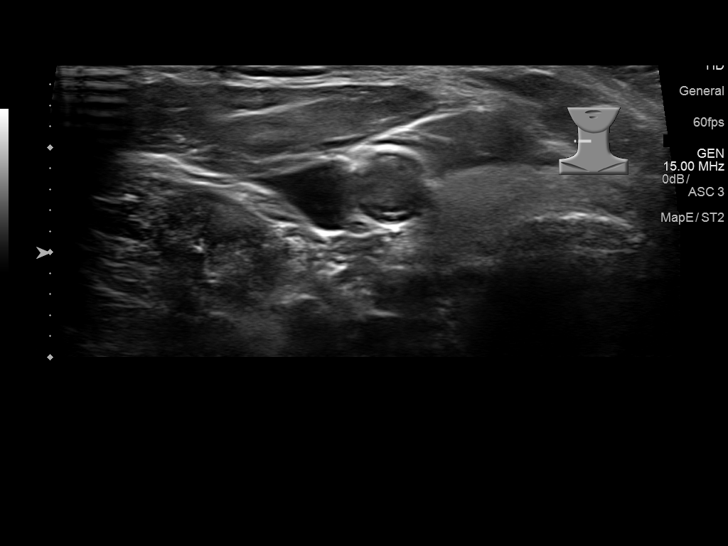
[im 41/41]
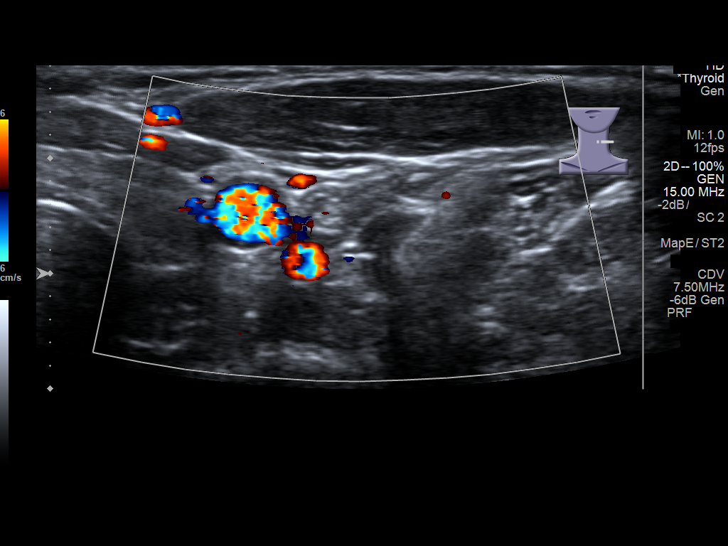

[14 of 25 positions shown; findings below may reference images not displayed]

FINDINGS: Parenchymal Echotexture: Normal

Isthmus: 0.4 cm

Right lobe: 4.0 x 1.2 x 0.9 cm

Left lobe: 3.1 x 1.1 x 0.8 cm

_________________________________________________________

Estimated total number of nodules >/= 1 cm: 0

Number of spongiform nodules >/=  2 cm not described below (TR1): 0

Number of mixed cystic and solid nodules >/= 1.5 cm not described
below (TR2): 0

_________________________________________________________

No discrete nodules are seen within the thyroid gland. No enlarged
lymph nodes.
IMPRESSION: Normal thyroid ultrasound.  No discrete thyroid nodules.

## 2019-11-08 DIAGNOSIS — H43393 Other vitreous opacities, bilateral: Secondary | ICD-10-CM | POA: Diagnosis not present

## 2019-11-08 DIAGNOSIS — H40013 Open angle with borderline findings, low risk, bilateral: Secondary | ICD-10-CM | POA: Diagnosis not present

## 2019-11-08 DIAGNOSIS — H40053 Ocular hypertension, bilateral: Secondary | ICD-10-CM | POA: Diagnosis not present

## 2019-11-12 ENCOUNTER — Ambulatory Visit (INDEPENDENT_AMBULATORY_CARE_PROVIDER_SITE_OTHER): Payer: Medicare HMO

## 2019-11-12 DIAGNOSIS — Z0000 Encounter for general adult medical examination without abnormal findings: Secondary | ICD-10-CM

## 2019-11-12 NOTE — Patient Instructions (Signed)
Ms. Veronica Mills , Thank you for taking time to come for your Medicare Wellness Visit. I appreciate your ongoing commitment to your health goals. Please review the following plan we discussed and let me know if I can assist you in the future.   Screening recommendations/referrals: Colonoscopy: cologuard 2020, due 2023 Mammogram: declined  Bone Density: not indicated  Recommended yearly ophthalmology/optometry visit for glaucoma screening and checkup Recommended yearly dental visit for hygiene and checkup  Vaccinations: Influenza vaccine: declined  Pneumococcal vaccine: not indicated due to allergy Tdap vaccine: due now, check with insurance for coverage  Shingles vaccine: shingrix eligible, check with insurance for coverage   Covid-19:  Completed   Advanced directives: Advance directive discussed with you today.  Once this is complete please bring a copy in to our office so we can scan it into your chart.  Conditions/risks identified: none   Next appointment: Follow up in one year for your annual wellness visit. 11/25/2019 at [place] 40-64 Years, Female Preventive care refers to lifestyle choices and visits with your health care provider that can promote health and wellness. What does preventive care include?  A yearly physical exam. This is also called an annual well check.  Dental exams once or twice a year.  Routine eye exams. Ask your health care provider how often you should have your eyes checked.  Personal lifestyle choices, including:  Daily care of your teeth and gums.  Regular physical activity.  Eating a healthy diet.  Avoiding tobacco and drug use.  Limiting alcohol use.  Practicing safe sex.  Taking low-dose aspirin daily starting at age 53.  Taking vitamin and mineral supplements as recommended by your health care provider. What happens during an annual well check? The services and screenings done by your health care provider during your annual  well check will depend on your age, overall health, lifestyle risk factors, and family history of disease. Counseling  Your health care provider may ask you questions about your:  Alcohol use.  Tobacco use.  Drug use.  Emotional well-being.  Home and relationship well-being.  Sexual activity.  Eating habits.  Work and work Statistician.  Method of birth control.  Menstrual cycle.  Pregnancy history. Screening  You may have the following tests or measurements:  Height, weight, and BMI.  Blood pressure.  Lipid and cholesterol levels. These may be checked every 5 years, or more frequently if you are over 54 years old.  Skin check.  Lung cancer screening. You may have this screening every year starting at age 4 if you have a 30-pack-year history of smoking and currently smoke or have quit within the past 15 years.  Fecal occult blood test (FOBT) of the stool. You may have this test every year starting at age 25.  Flexible sigmoidoscopy or colonoscopy. You may have a sigmoidoscopy every 5 years or a colonoscopy every 10 years starting at age 46.  Hepatitis C blood test.  Hepatitis B blood test.  Sexually transmitted disease (STD) testing.  Diabetes screening. This is done by checking your blood sugar (glucose) after you have not eaten for a while (fasting). You may have this done every 1-3 years.  Mammogram. This may be done every 1-2 years. Talk to your health care provider about when you should start having regular mammograms. This may depend on whether you have a family history of breast cancer.  BRCA-related cancer screening. This may be done if you have a family history of breast, ovarian,  tubal, or peritoneal cancers.  Pelvic exam and Pap test. This may be done every 3 years starting at age 21. Starting at age 30, this may be done every 5 years if you have a Pap test in combination with an HPV test.  Bone density scan. This is done to screen for osteoporosis.  You may have this scan if you are at high risk for osteoporosis. Discuss your test results, treatment options, and if necessary, the need for more tests with your health care provider. Vaccines  Your health care provider may recommend certain vaccines, such as:  Influenza vaccine. This is recommended every year.  Tetanus, diphtheria, and acellular pertussis (Tdap, Td) vaccine. You may need a Td booster every 10 years.  Zoster vaccine. You may need this after age 60.  Pneumococcal 13-valent conjugate (PCV13) vaccine. You may need this if you have certain conditions and were not previously vaccinated.  Pneumococcal polysaccharide (PPSV23) vaccine. You may need one or two doses if you smoke cigarettes or if you have certain conditions. Talk to your health care provider about which screenings and vaccines you need and how often you need them. This information is not intended to replace advice given to you by your health care provider. Make sure you discuss any questions you have with your health care provider. Document Released: 07/04/2015 Document Revised: 02/25/2016 Document Reviewed: 04/08/2015 Elsevier Interactive Patient Education  2017 [employer]    Fall Prevention in the Home Falls can cause injuries. They can happen to people of all ages. There are many things you can do to make your home safe and to help prevent falls. What can I do on the outside of my home?  Regularly fix the edges of walkways and driveways and fix any cracks.  Remove anything that might make you trip as you walk through a door, such as a raised step or threshold.  Trim any bushes or trees on the path to your home.  Use bright outdoor lighting.  Clear any walking paths of anything that might make someone trip, such as rocks or tools.  Regularly check to see if handrails are loose or broken. Make sure that both sides of any steps have handrails.  Any raised decks and porches should have guardrails on  the edges.  Have any leaves, snow, or ice cleared regularly.  Use sand or salt on walking paths during winter.  Clean up any spills in your garage right away. This includes oil or grease spills. What can I do in the bathroom?  Use night lights.  Install grab bars by the toilet and in the tub and shower. Do not use towel bars as grab bars.  Use non-skid mats or decals in the tub or shower.  If you need to sit down in the shower, use a plastic, non-slip stool.  Keep the floor dry. Clean up any water that spills on the floor as soon as it happens.  Remove soap buildup in the tub or shower regularly.  Attach bath mats securely with double-sided non-slip rug tape.  Do not have throw rugs and other things on the floor that can make you trip. What can I do in the bedroom?  Use night lights.  Make sure that you have a light by your bed that is easy to reach.  Do not use any sheets or blankets that are too big for your bed. They should not hang down onto the floor.  Have a firm chair that has side arms.   You can use this for support while you get dressed.  Do not have throw rugs and other things on the floor that can make you trip. What can I do in the kitchen?  Clean up any spills right away.  Avoid walking on wet floors.  Keep items that you use a lot in easy-to-reach places.  If you need to reach something above you, use a strong step stool that has a grab bar.  Keep electrical cords out of the way.  Do not use floor polish or wax that makes floors slippery. If you must use wax, use non-skid floor wax.  Do not have throw rugs and other things on the floor that can make you trip. What can I do with my stairs?  Do not leave any items on the stairs.  Make sure that there are handrails on both sides of the stairs and use them. Fix handrails that are broken or loose. Make sure that handrails are as long as the stairways.  Check any carpeting to make sure that it is firmly  attached to the stairs. Fix any carpet that is loose or worn.  Avoid having throw rugs at the top or bottom of the stairs. If you do have throw rugs, attach them to the floor with carpet tape.  Make sure that you have a light switch at the top of the stairs and the bottom of the stairs. If you do not have them, ask someone to add them for you. What else can I do to help prevent falls?  Wear shoes that:  Do not have high heels.  Have rubber bottoms.  Are comfortable and fit you well.  Are closed at the toe. Do not wear sandals.  If you use a stepladder:  Make sure that it is fully opened. Do not climb a closed stepladder.  Make sure that both sides of the stepladder are locked into place.  Ask someone to hold it for you, if possible.  Clearly mark and make sure that you can see:  Any grab bars or handrails.  First and last steps.  Where the edge of each step is.  Use tools that help you move around (mobility aids) if they are needed. These include:  Canes.  Walkers.  Scooters.  Crutches.  Turn on the lights when you go into a dark area. Replace any light bulbs as soon as they burn out.  Set up your furniture so you have a clear path. Avoid moving your furniture around.  If any of your floors are uneven, fix them.  If there are any pets around you, be aware of where they are.  Review your medicines with your doctor. Some medicines can make you feel dizzy. This can increase your chance of falling. Ask your doctor what other things that you can do to help prevent falls. This information is not intended to replace advice given to you by your health care provider. Make sure you discuss any questions you have with your health care provider. Document Released: 04/03/2009 Document Revised: 11/13/2015 Document Reviewed: 07/12/2014 Elsevier Interactive Patient Education  2017 [employer].

## 2019-11-12 NOTE — Progress Notes (Signed)
Subjective:   Veronica Mills is a 64 y.o. female who presents for Medicare Annual (Subsequent) preventive examination.   I connected with Samson Frederic today by telephone and verified that I am speaking with the correct person using two identifiers. Location patient: home Location provider: work Persons participating in the virtual visit: patient, provider.   I discussed the limitations, risks, security and privacy concerns of performing an evaluation and management service by telephone and the availability of in person appointments. I also discussed with the patient that there may be a patient responsible charge related to this service. The patient expressed understanding and verbally consented to this telephonic visit.    Interactive audio and video telecommunications were attempted between this provider and patient, however failed, due to patient having technical difficulties OR patient did not have access to video capability.  We continued and completed visit with audio only.  Some vital signs may be absent or patient reported.   Time Spent with patient on telephone encounter: 20 minutes   Review of Systems:   Cardiac Risk Factors include: advanced age (>28men, >23 women);dyslipidemia;hypertension     Objective:     Vitals: There were no vitals taken for this visit.  There is no height or weight on file to calculate BMI.  Advanced Directives 11/12/2019  Does Patient Have a Medical Advance Directive? No    Tobacco Social History   Tobacco Use  Smoking Status Former Smoker  . Packs/day: 0.00  . Years: 0.00  . Pack years: 0.00  . Quit date: 63  . Years since quitting: 37.4  Smokeless Tobacco Never Used     Counseling given: Not Answered   Clinical Intake:  Pre-visit preparation completed: Yes  Pain : 0-10 Pain Score: 2  Pain Type: Chronic pain Pain Location: Hip Pain Orientation: Left Pain Descriptors / Indicators: Aching     Nutritional Risks:  None Diabetes: No  How often do you need to have someone help you when you read instructions, pamphlets, or other written materials from your doctor or pharmacy?: 1 - Never  Interpreter Needed?: No  Information entered by :: Alailah Safley,LPN  Past Medical History:  Diagnosis Date  . Allergy   . Anxiety   . Bursitis of both hips   . Depression   . Insomnia   . Lyme disease   . Sleep apnea   . Sleep apnea   . Thyroid disease    Past Surgical History:  Procedure Laterality Date  . ABDOMINAL HYSTERECTOMY  06/21/1997  . SHOULDER SURGERY Left 05/2009   Family History  Problem Relation Age of Onset  . Mental retardation Mother   . Stroke Father   . Dementia Father    Social History   Socioeconomic History  . Marital status: Married    Spouse name: Not on file  . Number of children: Not on file  . Years of education: Not on file  . Highest education level: Master's degree (e.g., MA, MS, MEng, MEd, MSW, MBA)  Occupational History  . Occupation: disabled   Tobacco Use  . Smoking status: Former Smoker    Packs/day: 0.00    Years: 0.00    Pack years: 0.00    Quit date: 1984    Years since quitting: 37.4  . Smokeless tobacco: Never Used  Substance and Sexual Activity  . Alcohol use: Not Currently    Comment: 1 a month  . Drug use: No  . Sexual activity: Not on file  Other Topics Concern  .  Not on file  Social History Narrative   Still new to the area   Social Determinants of Health   Financial Resource Strain: Low Risk   . Difficulty of Paying Living Expenses: Not hard at all  Food Insecurity: No Food Insecurity  . Worried About Programme researcher, broadcasting/film/video in the Last Year: Never true  . Ran Out of Food in the Last Year: Never true  Transportation Needs: No Transportation Needs  . Lack of Transportation (Medical): No  . Lack of Transportation (Non-Medical): No  Physical Activity: Sufficiently Active  . Days of Exercise per Week: 7 days  . Minutes of Exercise per  Session: 30 min  Stress:   . Feeling of Stress :   Social Connections: Somewhat Isolated  . Frequency of Communication with Friends and Family: More than three times a week  . Frequency of Social Gatherings with Friends and Family: Once a week  . Attends Religious Services: Never  . Active Member of Clubs or Organizations: No  . Attends Banker Meetings: Never  . Marital Status: Married    Outpatient Encounter Medications as of 11/12/2019  Medication Sig  . ARMOUR THYROID 30 MG tablet TAKE 1 TABLET (30 MG TOTAL) BY MOUTH DAILY BEFORE BREAKFAST.  Marland Kitchen gabapentin (NEURONTIN) 600 MG tablet Take 1 tablet (600 mg total) by mouth 3 (three) times daily as needed.  Letta Pate ULTRA test strip USE AS INSTRUCTED  . diclofenac (VOLTAREN) 50 MG EC tablet Take 1 tablet (50 mg total) by mouth 2 (two) times daily. (Patient not taking: Reported on 11/12/2019)  . [DISCONTINUED] diazepam (VALIUM) 5 MG tablet Take 1 tablet (5 mg total) by mouth every 12 (twelve) hours as needed for anxiety. (Patient not taking: Reported on 11/10/2018)  . [DISCONTINUED] progesterone (PROMETRIUM) 100 MG capsule TAKE 1 CAPSULE BY MOUTH EVERY DAY (Patient not taking: Reported on 11/12/2019)  . [DISCONTINUED] trimethoprim-polymyxin b (POLYTRIM) ophthalmic solution Place 1 drop into the left eye every 4 (four) hours. (Patient not taking: Reported on 11/12/2019)   No facility-administered encounter medications on file as of 11/12/2019.    Activities of Daily Living In your present state of health, do you have any difficulty performing the following activities: 11/12/2019  Hearing? N  Comment no hearing aids  Vision? N  Comment eyeglasses, Dr.Woodard  Difficulty concentrating or making decisions? N  Walking or climbing stairs? N  Dressing or bathing? N  Doing errands, shopping? N  Preparing Food and eating ? N  Using the Toilet? N  In the past six months, have you accidently leaked urine? N  Do you have problems with  loss of bowel control? N  Managing your Medications? N  Managing your Finances? N  Housekeeping or managing your Housekeeping? N  Some recent data might be hidden    Patient Care Team: Particia Nearing, PA-C as PCP - General (Family Medicine)    Assessment:   This is a routine wellness examination for [place].  Exercise Activities and Dietary recommendations Current Exercise Habits: Home exercise routine, Type of exercise: walking(Scoop lateral trainer), Time (Minutes): 30, Frequency (Times/Week): 7, Weekly Exercise (Minutes/Week): 210, Intensity: Mild, Exercise limited by: None identified  Goals Addressed   None     Fall Risk: Fall Risk  11/12/2019 11/08/2018 10/13/2017  Falls in the past year? 0 0 No  Number falls in past yr: 0 - -  Injury with Fall? 0 - -    FALL RISK PREVENTION PERTAINING TO THE HOME:  Any  stairs in or around the home? Yes  If so, are there any without handrails? No   Home free of loose throw rugs in walkways, pet beds, electrical cords, etc? Yes  Adequate lighting in your home to reduce risk of falls? Yes   ASSISTIVE DEVICES UTILIZED TO PREVENT FALLS:  Life alert? No  Use of a cane, walker or w/c? No  Grab bars in the bathroom? No  Shower chair or bench in shower? No  Elevated toilet seat or a handicapped toilet? No   DME ORDERS:  DME order needed?  No   TIMED UP AND GO:  Unable to perform    Depression Screen PHQ 2/9 Scores 11/12/2019 11/08/2018 10/13/2017  PHQ - 2 Score 0 0 0     Cognitive Function     6CIT Screen 11/08/2018 10/16/2017  What Year? 0 points 0 points  What month? 0 points 0 points  What time? 0 points 0 points  Count back from 20 0 points 0 points  Months in reverse 0 points 0 points  Repeat phrase 0 points 0 points  Total Score 0 0    Immunization History  Administered Date(s) Administered  . PFIZER SARS-COV-2 Vaccination 09/07/2019, 10/02/2019    Qualifies for Shingles Vaccine? Yes  Zostavax completed  n/a. Due for Shingrix. Education has been provided regarding the importance of this vaccine. Pt has been advised to call insurance company to determine out of pocket expense. Advised may also receive vaccine at local pharmacy or Health Dept. Verbalized acceptance and understanding.  Tdap: Discussed need for TD/TDAP vaccine, patient verbalized understanding that this is not covered as a preventative with there insurance and to call the office if she develops any new skin injuries, ie: cuts, scrapes, bug bites, or open wounds.  Flu Vaccine:  Declined   Pneumococcal Vaccine: not indicated, also has allergy  Covid-19 Vaccine: Completed vaccines  Screening Tests Health Maintenance  Topic Date Due  . HIV Screening  Never done  . MAMMOGRAM  11/11/2020 (Originally 08/25/2015)  . TETANUS/TDAP  11/11/2020 (Originally 04/11/1977)  . INFLUENZA VACCINE  01/20/2020  . Fecal DNA (Cologuard)  11/26/2021  . COVID-19 Vaccine  Completed  . Hepatitis C Screening  Completed  . PAP SMEAR-Modifier  Discontinued    Cancer Screenings:  Colorectal Screening: Completed cologuard 11/27/2018. Repeat every 3 years.   Mammogram: Completed 2015. Repeat every year, declined   Bone Density: not indicated   Lung Cancer Screening: (Low Dose CT Chest recommended if Age 28-80 years, 30 pack-year currently smoking OR have quit w/in 15years.) does not qualify.     Additional Screening:  Hepatitis C Screening: does qualify; Completed 2019   Vision Screening: Recommended annual ophthalmology exams for early detection of glaucoma and other disorders of the eye. Is the patient up to date with their annual eye exam?  Yes  Who is the provider or what is the name of the office in which the pt attends annual eye exams? Dr.Woodard    Dental Screening: Recommended annual dental exams for proper oral hygiene  Community Resource Referral:  CRR required this visit?  No       Plan:  I have personally reviewed and  addressed the Medicare Annual Wellness questionnaire and have noted the following in the patient's chart:  A. Medical and social history B. Use of alcohol, tobacco or illicit drugs  C. Current medications and supplements D. Functional ability and status E.  Nutritional status F.  Physical activity G. Advance directives H. List  of other physicians I.  Hospitalizations, surgeries, and ER visits in previous 12 months J.  Vitals K. Screenings such as hearing and vision if needed, cognitive and depression L. Referrals and appointments   In addition, I have reviewed and discussed with patient certain preventive protocols, quality metrics, and best practice recommendations. A written personalized care plan for preventive services as well as general preventive health recommendations were provided to patient.  Signed,    Collene Schlichter, LPN  [id number] Nurse Health Advisor   Nurse Notes: none

## 2019-11-23 ENCOUNTER — Encounter: Payer: Self-pay | Admitting: Family Medicine

## 2019-11-23 ENCOUNTER — Other Ambulatory Visit: Payer: Self-pay

## 2019-11-23 ENCOUNTER — Ambulatory Visit (INDEPENDENT_AMBULATORY_CARE_PROVIDER_SITE_OTHER): Payer: Medicare HMO | Admitting: Family Medicine

## 2019-11-23 VITALS — BP 120/76 | HR 63 | Temp 98.2°F | Ht 64.0 in | Wt 180.0 lb

## 2019-11-23 DIAGNOSIS — G4733 Obstructive sleep apnea (adult) (pediatric): Secondary | ICD-10-CM

## 2019-11-23 DIAGNOSIS — E039 Hypothyroidism, unspecified: Secondary | ICD-10-CM

## 2019-11-23 DIAGNOSIS — Z0000 Encounter for general adult medical examination without abnormal findings: Secondary | ICD-10-CM

## 2019-11-23 DIAGNOSIS — Z6830 Body mass index (BMI) 30.0-30.9, adult: Secondary | ICD-10-CM

## 2019-11-23 DIAGNOSIS — E785 Hyperlipidemia, unspecified: Secondary | ICD-10-CM

## 2019-11-23 DIAGNOSIS — E6609 Other obesity due to excess calories: Secondary | ICD-10-CM | POA: Diagnosis not present

## 2019-11-23 DIAGNOSIS — F5104 Psychophysiologic insomnia: Secondary | ICD-10-CM

## 2019-11-23 DIAGNOSIS — R69 Illness, unspecified: Secondary | ICD-10-CM | POA: Diagnosis not present

## 2019-11-23 DIAGNOSIS — R7301 Impaired fasting glucose: Secondary | ICD-10-CM | POA: Diagnosis not present

## 2019-11-23 DIAGNOSIS — E66811 Obesity, class 1: Secondary | ICD-10-CM

## 2019-11-23 LAB — UA/M W/RFLX CULTURE, ROUTINE
Bilirubin, UA: NEGATIVE
Glucose, UA: NEGATIVE
Leukocytes,UA: NEGATIVE
Nitrite, UA: NEGATIVE
Protein,UA: NEGATIVE
RBC, UA: NEGATIVE
Specific Gravity, UA: <1.005 — ABNORMAL LOW (ref 1.005–1.030)
Urobilinogen, Ur: 0.2 mg/dL (ref 0.2–1.0)
pH, UA: 5.5 (ref 5.0–7.5)

## 2019-11-23 LAB — MICROSCOPIC EXAMINATION
Bacteria, UA: NONE SEEN
RBC, Urine: NONE SEEN /hpf (ref 0–2)

## 2019-11-23 MED ORDER — NALTREXONE-BUPROPION HCL ER 8-90 MG PO TB12: ORAL_TABLET | ORAL | 0 refills | Status: DC

## 2019-11-23 MED ORDER — GABAPENTIN 600 MG PO TABS: 600.0000 mg | ORAL_TABLET | Freq: Three times a day (TID) | ORAL | 1 refills | Status: DC | PRN

## 2019-11-23 NOTE — Assessment & Plan Note (Signed)
Stable with use of mouthguard, continue to monitor

## 2019-11-23 NOTE — Assessment & Plan Note (Signed)
Recheck labs, follow up with Endocrinology . Continue current regimen

## 2019-11-23 NOTE — Assessment & Plan Note (Signed)
Recheck A1C, adjust as needed. Continue working on lifestyle habits. 

## 2019-11-23 NOTE — Progress Notes (Signed)
BP 120/76   Pulse 63   Temp 98.2 F (36.8 C) (Oral)   Ht 5\' 4"  (1.626 m)   Wt 180 lb (81.6 kg)   SpO2 97%   BMI 30.90 kg/m    Subjective:    Patient ID: Veronica Mills, female    DOB: 1955/06/29, 64 y.o.   MRN: [id number]  HPI: Veronica Mills is a 64 y.o. female presenting on 11/23/2019 for comprehensive medical examination. Current medical complaints include:see below  Following loosely with Endocrinology for hypothyroidism and IFG. Due to COVID has not been back in over a year and overdue for labs in that time. Home BSs fasting have been running 120s-130s range. Trying to eat well and be active as much as possible. No low blood sugar spells, polyuria, polydipsia, polyphagia.   Still concerned about weight. Trying diet and exercise but is still the heaviest she's ever been per patient. Has not tried weight loss medications in the past.   Takes gabapentin for sleep and hx of chronic lyme, and uses a mouthguard for sleep apnea which she states works incredibly well for her.   No other concerns today.   She currently lives with: Menopausal Symptoms: no  Depression Screen done today and results listed below:  Depression screen [facility] 2/9 11/23/2019 11/12/2019 11/08/2018 10/13/2017  Decreased Interest 0 0 0 0  Down, Depressed, Hopeless 1 0 0 0  PHQ - 2 Score 1 0 0 0  Altered sleeping 1 - - -  Tired, decreased energy 1 - - -  Change in appetite 1 - - -  Feeling bad or failure about yourself  0 - - -  Trouble concentrating 0 - - -  Moving slowly or fidgety/restless 0 - - -  Suicidal thoughts 0 - - -  PHQ-9 Score 4 - - -    The patient does not have a history of falls. I did complete a risk assessment for falls. A plan of care for falls was documented.   Past Medical History:  Past Medical History:  Diagnosis Date  . Allergy   . Anxiety   . Bursitis of both hips   . Depression   . Insomnia   . Lyme disease   . Sleep apnea   . Sleep apnea   . Thyroid disease     Surgical  History:  Past Surgical History:  Procedure Laterality Date  . ABDOMINAL HYSTERECTOMY  06/21/1997  . SHOULDER SURGERY Left 05/2009    Medications:  Current Outpatient Medications on File Prior to Visit  Medication Sig  . ARMOUR THYROID 30 MG tablet TAKE 1 TABLET (30 MG TOTAL) BY MOUTH DAILY BEFORE BREAKFAST.  Marland Kitchen ONETOUCH ULTRA test strip USE AS INSTRUCTED  . diclofenac (VOLTAREN) 50 MG EC tablet Take 1 tablet (50 mg total) by mouth 2 (two) times daily. (Patient not taking: Reported on 11/12/2019)   No current facility-administered medications on file prior to visit.    Allergies:  Allergies  Allergen Reactions  . Plaquenil [Hydroxychloroquine Sulfate] Itching  . Rocephin [Ceftriaxone Sodium In Dextrose] Hives  . Pneumococcal Vaccines Other (See Comments)  . Prednisone Other (See Comments)    Increased pain sxs  . Erythromycin     UTI  . Trazodone And Nefazodone     Makes patient hyper. Ineffective.    Social History:  Social History   Socioeconomic History  . Marital status: Married    Spouse name: Not on file  . Number of children: Not on file  .  Years of education: Not on file  . Highest education level: Master's degree (e.g., MA, MS, MEng, MEd, MSW, MBA)  Occupational History  . Occupation: disabled   Tobacco Use  . Smoking status: Former Smoker    Packs/day: 0.00    Years: 0.00    Pack years: 0.00    Quit date: 1984    Years since quitting: 37.4  . Smokeless tobacco: Never Used  Substance and Sexual Activity  . Alcohol use: Not Currently    Comment: 1 a month  . Drug use: No  . Sexual activity: Not on file  Other Topics Concern  . Not on file  Social History Narrative   Still new to the area   Social Determinants of Health   Financial Resource Strain: Low Risk   . Difficulty of Paying Living Expenses: Not hard at all  Food Insecurity: No Food Insecurity  . Worried About Programme researcher, broadcasting/film/video in the Last Year: Never true  . Ran Out of Food in the Last  Year: Never true  Transportation Needs: No Transportation Needs  . Lack of Transportation (Medical): No  . Lack of Transportation (Non-Medical): No  Physical Activity: Sufficiently Active  . Days of Exercise per Week: 7 days  . Minutes of Exercise per Session: 30 min  Stress:   . Feeling of Stress :   Social Connections: Somewhat Isolated  . Frequency of Communication with Friends and Family: More than three times a week  . Frequency of Social Gatherings with Friends and Family: Once a week  . Attends Religious Services: Never  . Active Member of Clubs or Organizations: No  . Attends Banker Meetings: Never  . Marital Status: Married  Catering manager Violence:   . Fear of Current or Ex-Partner:   . Emotionally Abused:   Marland Kitchen Physically Abused:   . Sexually Abused:    Social History   Tobacco Use  Smoking Status Former Smoker  . Packs/day: 0.00  . Years: 0.00  . Pack years: 0.00  . Quit date: 62  . Years since quitting: 37.4  Smokeless Tobacco Never Used   Social History   Substance and Sexual Activity  Alcohol Use Not Currently   Comment: 1 a month    Family History:  Family History  Problem Relation Age of Onset  . Mental retardation Mother   . Stroke Father   . Dementia Father     Past medical history, surgical history, medications, allergies, family history and social history reviewed with patient today and changes made to appropriate areas of the chart.   Review of Systems - General ROS: positive for  - weight gain Psychological ROS: negative Ophthalmic ROS: negative ENT ROS: negative Allergy and Immunology ROS: negative Hematological and Lymphatic ROS: negative Endocrine ROS: negative Breast ROS: negative for breast lumps Respiratory ROS: no cough, shortness of breath, or wheezing Cardiovascular ROS: no chest pain or dyspnea on exertion Gastrointestinal ROS: no abdominal pain, change in bowel habits, or black or bloody  stools Genito-Urinary ROS: no dysuria, trouble voiding, or hematuria Musculoskeletal ROS: negative Neurological ROS: no TIA or stroke symptoms Dermatological ROS: negative All other ROS negative except what is listed above and in the HPI.      Objective:    BP 120/76   Pulse 63   Temp 98.2 F (36.8 C) (Oral)   Ht 5\' 4"  (1.626 m)   Wt 180 lb (81.6 kg)   SpO2 97%   BMI 30.90 kg/m  Wt Readings from Last 3 Encounters:  11/23/19 180 lb (81.6 kg)  05/25/19 173 lb (78.5 kg)  11/10/18 170 lb (77.1 kg)    Physical Exam Vitals and nursing note reviewed.  Constitutional:      General: She is not in acute distress.    Appearance: She is well-developed.  HENT:     Head: Atraumatic.     Right Ear: External ear normal.     Left Ear: External ear normal.     Nose: Nose normal.     Mouth/Throat:     Pharynx: No oropharyngeal exudate.  Eyes:     General: No scleral icterus.    Conjunctiva/sclera: Conjunctivae normal.     Pupils: Pupils are equal, round, and reactive to light.  Neck:     Thyroid: No thyromegaly.  Cardiovascular:     Rate and Rhythm: Normal rate and regular rhythm.     Heart sounds: Normal heart sounds.  Pulmonary:     Effort: Pulmonary effort is normal. No respiratory distress.     Breath sounds: Normal breath sounds.  Chest:     Breasts:        Right: No mass, skin change or tenderness.        Left: No mass, skin change or tenderness.  Abdominal:     General: Bowel sounds are normal.     Palpations: Abdomen is soft. There is no mass.     Tenderness: There is no abdominal tenderness.  Genitourinary:    Comments: GU exam declined Musculoskeletal:        General: No tenderness. Normal range of motion.     Cervical back: Normal range of motion and neck supple.  Lymphadenopathy:     Cervical: No cervical adenopathy.     Upper Body:     Right upper body: No axillary adenopathy.     Left upper body: No axillary adenopathy.  Skin:    General: Skin is warm  and dry.     Findings: No rash.  Neurological:     Mental Status: She is alert and oriented to person, place, and time.     Cranial Nerves: No cranial nerve deficit.  Psychiatric:        Behavior: Behavior normal.     Results for orders placed or performed in visit on 11/23/19  Microscopic Examination   URINE  Result Value Ref Range   WBC, UA 0-5 0 - 5 /hpf   RBC None seen 0 - 2 /hpf   Epithelial Cells (non renal) 0-10 0 - 10 /hpf   Bacteria, UA None seen None seen/Few  UA/M w/rflx Culture, Routine   Specimen: Urine   URINE  Result Value Ref Range   Specific Gravity, UA <1.005 (L) 1.005 - 1.030   pH, UA 5.5 5.0 - 7.5   Color, UA Yellow Yellow   Appearance Ur Clear Clear   Leukocytes,UA Negative Negative   Protein,UA Negative Negative/Trace   Glucose, UA Negative Negative   Ketones, UA 1+ (A) Negative   RBC, UA Negative Negative   Bilirubin, UA Negative Negative   Urobilinogen, Ur 0.2 0.2 - 1.0 mg/dL   Nitrite, UA Negative Negative   Microscopic Examination See below:       Assessment & Plan:   Problem List Items Addressed This Visit      Respiratory   Sleep apnea - Primary    Stable with use of mouthguard, continue to monitor      OSA (obstructive sleep apnea)  Endocrine   Hypothyroid    Recheck labs, follow up with Endocrinology . Continue current regimen      Relevant Orders   TSH   T3   T4   IFG (impaired fasting glucose)    Recheck A1C, adjust as needed. Continue working on lifestyle habits      Relevant Orders   HgB A1c     Other   Chronic insomnia (Chronic)    Stable on gabapentin, continue current regimen      Hyperlipemia    Recheck lipids, continue lifestyle habits for control      Relevant Orders   Comprehensive metabolic panel   Lipid Panel w/o Chol/HDL Ratio    Other Visit Diagnoses    Annual physical exam       Relevant Orders   CBC with Differential/Platelet   UA/M w/rflx Culture, Routine (Completed)   Class 1 obesity  due to excess calories with serious comorbidity and body mass index (BMI) of 30.0 to 30.9 in adult       Start contrave, recheck 1 month. Risks and precautions reviewed, continue diet and exercise changes   Relevant Medications   Naltrexone-buPROPion HCl ER 8-90 MG TB12       Follow up plan: Return in about 4 weeks (around 12/21/2019) for Weight check.   LABORATORY TESTING:  - Pap smear: not applicable  IMMUNIZATIONS:   - Tdap: Tetanus vaccination status reviewed: refused. - Influenza: Refused  SCREENING: -Mammogram: Refused  - Colonoscopy: Up to date   PATIENT COUNSELING:   Advised to take 1 mg of folate supplement per day if capable of pregnancy.   Sexuality: Discussed sexually transmitted diseases, partner selection, use of condoms, avoidance of unintended pregnancy  and contraceptive alternatives.   Advised to avoid cigarette smoking.  I discussed with the patient that most people either abstain from alcohol or drink within safe limits (<=14/week and <=4 drinks/occasion for males, <=7/weeks and <= 3 drinks/occasion for females) and that the risk for alcohol disorders and other health effects rises proportionally with the number of drinks per week and how often a drinker exceeds daily limits.  Discussed cessation/primary prevention of drug use and availability of treatment for abuse.   Diet: Encouraged to adjust caloric intake to maintain  or achieve ideal body weight, to reduce intake of dietary saturated fat and total fat, to limit sodium intake by avoiding high sodium foods and not adding table salt, and to maintain adequate dietary potassium and calcium preferably from fresh fruits, vegetables, and low-fat dairy products.    stressed the importance of regular exercise  Injury prevention: Discussed safety belts, safety helmets, smoke detector, smoking near bedding or upholstery.   Dental health: Discussed importance of regular tooth brushing, flossing, and dental visits.     NEXT PREVENTATIVE PHYSICAL DUE IN 1 YEAR. Return in about 4 weeks (around 12/21/2019) for Weight check.

## 2019-11-23 NOTE — Assessment & Plan Note (Signed)
Recheck lipids, continue lifestyle habits for control. 

## 2019-11-23 NOTE — Assessment & Plan Note (Signed)
Stable on gabapentin, continue current regimen 

## 2019-11-23 NOTE — Patient Instructions (Signed)
Saxenda Metformin Topamax Plain Wellbutrin (instead of the contrave which includes naltrexone)

## 2019-11-24 LAB — CBC WITH DIFFERENTIAL/PLATELET
Basophils Absolute: 0 10*3/uL (ref 0.0–0.2)
Basos: 0 %
EOS (ABSOLUTE): 0.2 10*3/uL (ref 0.0–0.4)
Eos: 3 %
Hematocrit: 45.4 % (ref 34.0–46.6)
Hemoglobin: 15.2 g/dL (ref 11.1–15.9)
Immature Grans (Abs): 0 10*3/uL (ref 0.0–0.1)
Immature Granulocytes: 0 %
Lymphocytes Absolute: 2.4 10*3/uL (ref 0.7–3.1)
Lymphs: 30 %
MCH: 30.3 pg (ref 26.6–33.0)
MCHC: 33.5 g/dL (ref 31.5–35.7)
MCV: 90 fL (ref 79–97)
Monocytes Absolute: 0.5 10*3/uL (ref 0.1–0.9)
Monocytes: 7 %
Neutrophils Absolute: 4.7 10*3/uL (ref 1.4–7.0)
Neutrophils: 60 %
Platelets: 281 10*3/uL (ref 150–450)
RBC: 5.02 x10E6/uL (ref 3.77–5.28)
RDW: 12.2 % (ref 11.7–15.4)
WBC: 7.9 10*3/uL (ref 3.4–10.8)

## 2019-11-24 LAB — TSH: TSH: 1.46 u[IU]/mL (ref 0.450–4.500)

## 2019-11-24 LAB — COMPREHENSIVE METABOLIC PANEL
ALT: 55 IU/L — ABNORMAL HIGH (ref 0–32)
AST: 40 IU/L (ref 0–40)
Albumin/Globulin Ratio: 2 (ref 1.2–2.2)
Albumin: 5 g/dL — ABNORMAL HIGH (ref 3.8–4.8)
Alkaline Phosphatase: 77 IU/L (ref 48–121)
BUN/Creatinine Ratio: 18 (ref 12–28)
BUN: 13 mg/dL (ref 8–27)
Bilirubin Total: 0.3 mg/dL (ref 0.0–1.2)
CO2: 25 mmol/L (ref 20–29)
Calcium: 10.2 mg/dL (ref 8.7–10.3)
Chloride: 101 mmol/L (ref 96–106)
Creatinine, Ser: 0.71 mg/dL (ref 0.57–1.00)
GFR calc Af Amer: 106 mL/min/{1.73_m2} (ref >59)
GFR calc non Af Amer: 92 mL/min/{1.73_m2} (ref >59)
Globulin, Total: 2.5 g/dL (ref 1.5–4.5)
Glucose: 102 mg/dL — ABNORMAL HIGH (ref 65–99)
Potassium: 4.4 mmol/L (ref 3.5–5.2)
Sodium: 141 mmol/L (ref 134–144)
Total Protein: 7.5 g/dL (ref 6.0–8.5)

## 2019-11-24 LAB — LIPID PANEL W/O CHOL/HDL RATIO
Cholesterol, Total: 171 mg/dL (ref 100–199)
HDL: 64 mg/dL (ref >39)
LDL Chol Calc (NIH): 92 mg/dL (ref 0–99)
Triglycerides: 78 mg/dL (ref 0–149)
VLDL Cholesterol Cal: 15 mg/dL (ref 5–40)

## 2019-11-24 LAB — HEMOGLOBIN A1C
Est. average glucose Bld gHb Est-mCnc: 128 mg/dL
Hgb A1c MFr Bld: 6.1 % — ABNORMAL HIGH (ref 4.8–5.6)

## 2019-11-25 LAB — T3: T3, Total: 141 ng/dL (ref 71–180)

## 2019-11-25 LAB — SPECIMEN STATUS REPORT

## 2019-11-25 LAB — T4: T4, Total: 6.2 ug/dL (ref 4.5–12.0)

## 2019-12-04 DIAGNOSIS — R69 Illness, unspecified: Secondary | ICD-10-CM | POA: Diagnosis not present

## 2019-12-20 ENCOUNTER — Encounter: Payer: Self-pay | Admitting: Family Medicine

## 2019-12-28 ENCOUNTER — Ambulatory Visit (INDEPENDENT_AMBULATORY_CARE_PROVIDER_SITE_OTHER): Payer: Medicare HMO | Admitting: Family Medicine

## 2019-12-28 ENCOUNTER — Other Ambulatory Visit: Payer: Self-pay

## 2019-12-28 ENCOUNTER — Encounter: Payer: Self-pay | Admitting: Family Medicine

## 2019-12-28 DIAGNOSIS — E663 Overweight: Secondary | ICD-10-CM

## 2019-12-28 NOTE — Progress Notes (Signed)
BP 135/73   Pulse 66   Temp 98.3 F (36.8 C) (Oral)   Wt 172 lb (78 kg)   SpO2 97%   BMI 29.52 kg/m    Subjective:    Patient ID: Veronica Mills, female    DOB: 1955/07/27, 64 y.o.   MRN: [id number]  HPI: Veronica Mills is a 64 y.o. female  Chief Complaint  Patient presents with  . Weight Check   Presenting today for 1 month weight check after starting contrave. Initially tolerated starter dose well but once increasing to two tabs at once began experiencing dizziness. Dropped down to 1 tab BID and hasn't had issues since. Down 8 lb or so since starting and notes her appetite is much less. Staying active and eating healthy diet. No other concerns today, and denies CP, SOB, palpitations, HAs.   Relevant past medical, surgical, family and social history reviewed and updated as indicated. Interim medical history since our last visit reviewed. Allergies and medications reviewed and updated.  Review of Systems  Per HPI unless specifically indicated above     Objective:    BP 135/73   Pulse 66   Temp 98.3 F (36.8 C) (Oral)   Wt 172 lb (78 kg)   SpO2 97%   BMI 29.52 kg/m   Wt Readings from Last 3 Encounters:  12/28/19 172 lb (78 kg)  11/23/19 180 lb (81.6 kg)  05/25/19 173 lb (78.5 kg)    Physical Exam Vitals and nursing note reviewed.  Constitutional:      Appearance: Normal appearance. She is not ill-appearing.  HENT:     Head: Atraumatic.  Eyes:     Extraocular Movements: Extraocular movements intact.     Conjunctiva/sclera: Conjunctivae normal.  Cardiovascular:     Rate and Rhythm: Normal rate and regular rhythm.     Heart sounds: Normal heart sounds.  Pulmonary:     Effort: Pulmonary effort is normal.     Breath sounds: Normal breath sounds.  Musculoskeletal:        General: Normal range of motion.     Cervical back: Normal range of motion and neck supple.  Skin:    General: Skin is warm and dry.  Neurological:     Mental Status: She is alert and  oriented to person, place, and time.  Psychiatric:        Mood and Affect: Mood normal.        Thought Content: Thought content normal.        Judgment: Judgment normal.     Results for orders placed or performed in visit on 11/23/19  Microscopic Examination   URINE  Result Value Ref Range   WBC, UA 0-5 0 - 5 /hpf   RBC None seen 0 - 2 /hpf   Epithelial Cells (non renal) 0-10 0 - 10 /hpf   Bacteria, UA None seen None seen/Few  CBC with Differential/Platelet  Result Value Ref Range   WBC 7.9 3.4 - 10.8 x10E3/uL   RBC 5.02 3.77 - 5.28 x10E6/uL   Hemoglobin 15.2 11.1 - 15.9 g/dL   Hematocrit 45.0 38.8 - 46.6 %   MCV 90 79 - 97 fL   MCH 30.3 26.6 - 33.0 pg   MCHC 33.5 31 - 35 g/dL   RDW 82.8 00.3 - 49.1 %   Platelets 281 150 - 450 x10E3/uL   Neutrophils 60 Not Estab. %   Lymphs 30 Not Estab. %   Monocytes 7 Not Estab. %   Eos  3 Not Estab. %   Basos 0 Not Estab. %   Neutrophils Absolute 4.7 1 - 7 x10E3/uL   Lymphocytes Absolute 2.4 0 - 3 x10E3/uL   Monocytes Absolute 0.5 0 - 0 x10E3/uL   EOS (ABSOLUTE) 0.2 0.0 - 0.4 x10E3/uL   Basophils Absolute 0.0 0 - 0 x10E3/uL   Immature Granulocytes 0 Not Estab. %   Immature Grans (Abs) 0.0 0.0 - 0.1 x10E3/uL  Comprehensive metabolic panel  Result Value Ref Range   Glucose 102 (H) 65 - 99 mg/dL   BUN 13 8 - 27 mg/dL   Creatinine, Ser 0.97 0.57 - 1.00 mg/dL   GFR calc non Af Amer 92 >59 mL/min/1.73   GFR calc Af Amer 106 >59 mL/min/1.73   BUN/Creatinine Ratio 18 12 - 28   Sodium 141 134 - 144 mmol/L   Potassium 4.4 3.5 - 5.2 mmol/L   Chloride 101 96 - 106 mmol/L   CO2 25 20 - 29 mmol/L   Calcium 10.2 8.7 - 10.3 mg/dL   Total Protein 7.5 6.0 - 8.5 g/dL   Albumin 5.0 (H) 3.8 - 4.8 g/dL   Globulin, Total 2.5 1.5 - 4.5 g/dL   Albumin/Globulin Ratio 2.0 1.2 - 2.2   Bilirubin Total 0.3 0.0 - 1.2 mg/dL   Alkaline Phosphatase 77 48 - 121 IU/L   AST 40 0 - 40 IU/L   ALT 55 (H) 0 - 32 IU/L  Lipid Panel w/o Chol/HDL Ratio  Result  Value Ref Range   Cholesterol, Total 171 100 - 199 mg/dL   Triglycerides 78 0 - 149 mg/dL   HDL 64 >35 mg/dL   VLDL Cholesterol Cal 15 5 - 40 mg/dL   LDL Chol Calc (NIH) 92 0 - 99 mg/dL  TSH  Result Value Ref Range   TSH 1.460 0.450 - 4.500 uIU/mL  UA/M w/rflx Culture, Routine   Specimen: Urine   URINE  Result Value Ref Range   Specific Gravity, UA <1.005 (L) 1.005 - 1.030   pH, UA 5.5 5.0 - 7.5   Color, UA Yellow Yellow   Appearance Ur Clear Clear   Leukocytes,UA Negative Negative   Protein,UA Negative Negative/Trace   Glucose, UA Negative Negative   Ketones, UA 1+ (A) Negative   RBC, UA Negative Negative   Bilirubin, UA Negative Negative   Urobilinogen, Ur 0.2 0.2 - 1.0 mg/dL   Nitrite, UA Negative Negative   Microscopic Examination See below:   HgB A1c  Result Value Ref Range   Hgb A1c MFr Bld 6.1 (H) 4.8 - 5.6 %   Est. average glucose Bld gHb Est-mCnc 128 mg/dL  T4  Result Value Ref Range   T4, Total 6.2 4.5 - 12.0 ug/dL  T3  Result Value Ref Range   T3, Total 141 71 - 180 ng/dL  Specimen status report  Result Value Ref Range   specimen status report Comment       Assessment & Plan:   Problem List Items Addressed This Visit      Other   Overweight    Excellent initial response to low dose contrave. Continue current regimen at one tab BID and good diet/exercise habits.           Follow up plan: Return in about 3 months (around 03/29/2020) for weight check.

## 2019-12-30 DIAGNOSIS — E663 Overweight: Secondary | ICD-10-CM | POA: Insufficient documentation

## 2019-12-30 NOTE — Assessment & Plan Note (Signed)
Excellent initial response to low dose contrave. Continue current regimen at one tab BID and good diet/exercise habits.

## 2020-01-02 DIAGNOSIS — R69 Illness, unspecified: Secondary | ICD-10-CM | POA: Diagnosis not present

## 2020-01-18 ENCOUNTER — Encounter: Payer: Self-pay | Admitting: Family Medicine

## 2020-01-21 ENCOUNTER — Other Ambulatory Visit: Payer: Self-pay | Admitting: Family Medicine

## 2020-01-21 MED ORDER — NALTREXONE-BUPROPION HCL ER 8-90 MG PO TB12: ORAL_TABLET | ORAL | 2 refills | Status: DC

## 2020-01-22 ENCOUNTER — Other Ambulatory Visit: Payer: Self-pay | Admitting: Nurse Practitioner

## 2020-01-22 MED ORDER — NALTREXONE-BUPROPION HCL ER 8-90 MG PO TB12: ORAL_TABLET | ORAL | 1 refills | Status: DC

## 2020-02-03 ENCOUNTER — Emergency Department: Payer: Medicare HMO

## 2020-02-03 ENCOUNTER — Other Ambulatory Visit: Payer: Self-pay

## 2020-02-03 ENCOUNTER — Emergency Department
Admission: EM | Admit: 2020-02-03 | Discharge: 2020-02-03 | Disposition: A | Discharge status: Home / Self Care | Payer: Medicare HMO | Attending: Emergency Medicine | Admitting: Emergency Medicine

## 2020-02-03 ENCOUNTER — Encounter: Payer: Self-pay | Admitting: Radiology

## 2020-02-03 DIAGNOSIS — M546 Pain in thoracic spine: Secondary | ICD-10-CM | POA: Insufficient documentation

## 2020-02-03 DIAGNOSIS — I700 Atherosclerosis of aorta: Secondary | ICD-10-CM | POA: Diagnosis not present

## 2020-02-03 DIAGNOSIS — R1013 Epigastric pain: Secondary | ICD-10-CM | POA: Diagnosis not present

## 2020-02-03 DIAGNOSIS — E039 Hypothyroidism, unspecified: Secondary | ICD-10-CM | POA: Diagnosis not present

## 2020-02-03 DIAGNOSIS — Z79899 Other long term (current) drug therapy: Secondary | ICD-10-CM | POA: Diagnosis not present

## 2020-02-03 DIAGNOSIS — Z87891 Personal history of nicotine dependence: Secondary | ICD-10-CM | POA: Insufficient documentation

## 2020-02-03 DIAGNOSIS — K7689 Other specified diseases of liver: Secondary | ICD-10-CM | POA: Diagnosis not present

## 2020-02-03 DIAGNOSIS — K5641 Fecal impaction: Secondary | ICD-10-CM | POA: Diagnosis not present

## 2020-02-03 DIAGNOSIS — R109 Unspecified abdominal pain: Secondary | ICD-10-CM

## 2020-02-03 DIAGNOSIS — K760 Fatty (change of) liver, not elsewhere classified: Secondary | ICD-10-CM | POA: Diagnosis not present

## 2020-02-03 LAB — URINALYSIS, COMPLETE (UACMP) WITH MICROSCOPIC
Bacteria, UA: NONE SEEN
Bilirubin Urine: NEGATIVE
Glucose, UA: NEGATIVE mg/dL
Hgb urine dipstick: NEGATIVE
Ketones, ur: 5 mg/dL — AB
Leukocytes,Ua: NEGATIVE
Nitrite: NEGATIVE
Protein, ur: NEGATIVE mg/dL
Specific Gravity, Urine: 1.016 (ref 1.005–1.030)
pH: 7 (ref 5.0–8.0)

## 2020-02-03 LAB — CBC
HCT: 46.4 % — ABNORMAL HIGH (ref 36.0–46.0)
Hemoglobin: 15.8 g/dL — ABNORMAL HIGH (ref 12.0–15.0)
MCH: 30.9 pg (ref 26.0–34.0)
MCHC: 34.1 g/dL (ref 30.0–36.0)
MCV: 90.8 fL (ref 80.0–100.0)
Platelets: 261 10*3/uL (ref 150–400)
RBC: 5.11 MIL/uL (ref 3.87–5.11)
RDW: 12.4 % (ref 11.5–15.5)
WBC: 10.1 10*3/uL (ref 4.0–10.5)
nRBC: 0 % (ref 0.0–0.2)

## 2020-02-03 LAB — COMPREHENSIVE METABOLIC PANEL
ALT: 133 U/L — ABNORMAL HIGH (ref 0–44)
AST: 200 U/L — ABNORMAL HIGH (ref 15–41)
Albumin: 4.6 g/dL (ref 3.5–5.0)
Alkaline Phosphatase: 96 U/L (ref 38–126)
Anion gap: 12 (ref 5–15)
BUN: 12 mg/dL (ref 8–23)
CO2: 28 mmol/L (ref 22–32)
Calcium: 9.9 mg/dL (ref 8.9–10.3)
Chloride: 102 mmol/L (ref 98–111)
Creatinine, Ser: 0.65 mg/dL (ref 0.44–1.00)
GFR calc Af Amer: >60 mL/min (ref >60)
GFR calc non Af Amer: >60 mL/min (ref >60)
Glucose, Bld: 148 mg/dL — ABNORMAL HIGH (ref 70–99)
Potassium: 3.6 mmol/L (ref 3.5–5.1)
Sodium: 142 mmol/L (ref 135–145)
Total Bilirubin: 1.3 mg/dL — ABNORMAL HIGH (ref 0.3–1.2)
Total Protein: 7.6 g/dL (ref 6.5–8.1)

## 2020-02-03 LAB — LIPASE, BLOOD: Lipase: 31 U/L (ref 11–51)

## 2020-02-03 MED ORDER — IOHEXOL 300 MG/ML  SOLN
100.0000 mL | Freq: Once | INTRAMUSCULAR | Status: AC | PRN
  Administered 2020-02-03: 100 mL via INTRAVENOUS

## 2020-02-03 MED ORDER — DICYCLOMINE HCL 10 MG PO CAPS: 10.0000 mg | ORAL_CAPSULE | Freq: Three times a day (TID) | ORAL | 0 refills | Status: DC | PRN

## 2020-02-03 NOTE — ED Provider Notes (Signed)
CT scan without any concerning findings. UA not consistent with infection. Discussed results with patient. At this time will plan on discharging home. Will give prescription for bentyl to try to help with symptoms. Discussed importance of follow up with primary care.   Phineas Semen, MD 02/03/20 [phone]

## 2020-02-03 NOTE — ED Triage Notes (Signed)
Patient reports right lower abdominal pain off/on since Wednesday.

## 2020-02-03 NOTE — ED Provider Notes (Signed)
[facility] Emergency Department Provider Note ____________________________________________   First MD Initiated Contact with Patient 02/03/20 1304     (approximate)  I have reviewed the triage vital signs and the nursing notes.  HISTORY  Chief Complaint Abdominal Pain   HPI Veronica Mills is a 64 y.o. femalewho presents to the ED for evaluation of abdominal pain.   Chart review indicates hx OSA, anxiety/depression.   Patient reports intermittent abdominal pains of the past 5 nights.  She reports the pain only occurs at night, occasionally waking her from sleep, and never occurring during the daytime.  She reports a sharp tightness across her upper abdomen, radiating to her lower thoracic back, 8/10 intensity and lasting a matter of hours before self resolving.  She denies pain at this time and reports that she feels well at this time.  She reports no changes in p.o. intake or toileting, no nausea, vomiting, diarrhea, dysuria, abdominal pain postprandially.  She reports that her daughter is an Charity fundraiser and patient sent the daughter her lab results while waiting in the waiting room, patient reports significant concern for "my liver or my gallbladder because my AST is skyhigh." Patient reports about 5 glasses of wine per month, her last alcohol drink was greater than 2 weeks ago, and patient has no history of alcoholism or liver disease.  Past Medical History:  Diagnosis Date  . Allergy   . Anxiety   . Bursitis of both hips   . Depression   . Insomnia   . Lyme disease   . Sleep apnea   . Sleep apnea   . Thyroid disease     Patient Active Problem List   Diagnosis Date Noted  . Overweight 12/30/2019  . OSA (obstructive sleep apnea) 05/22/2019  . Snoring 05/22/2019  . Early awakening 05/22/2019  . Sleep apnea 11/10/2018  . History of Lyme disease 08/30/2018  . IFG (impaired fasting glucose) 07/29/2018  . Hyperlipemia 07/25/2018  . Chronic insomnia  09/07/2017  . Hypothyroid 09/07/2017  . Bursitis of both hips 09/07/2017    Past Surgical History:  Procedure Laterality Date  . ABDOMINAL HYSTERECTOMY  06/21/1997  . SHOULDER SURGERY Left 05/2009    Prior to Admission medications   Medication Sig Start Date End Date Taking? Authorizing Provider  ARMOUR THYROID 30 MG tablet TAKE 1 TABLET (30 MG TOTAL) BY MOUTH DAILY BEFORE BREAKFAST. 06/12/19   Particia Nearing, PA-C  diclofenac (VOLTAREN) 50 MG EC tablet Take 1 tablet (50 mg total) by mouth 2 (two) times daily. Patient not taking: Reported on 11/12/2019 12/30/17   Particia Nearing, PA-C  gabapentin (NEURONTIN) 600 MG tablet Take 1 tablet (600 mg total) by mouth 3 (three) times daily as needed. 11/23/19   Particia Nearing, PA-C  Naltrexone-buPROPion HCl ER 8-90 MG TB12 Take 1 tablet twice daily 01/22/20   Wells Guiles, NP  OneTouch Delica Lancets 30G MISC  11/23/19   [provider]  [facility] ULTRA test strip USE AS INSTRUCTED 08/25/19   Particia Nearing, PA-C    Allergies Plaquenil [hydroxychloroquine sulfate], Rocephin [ceftriaxone sodium in dextrose], Pneumococcal vaccines, Prednisone, Erythromycin, and Trazodone and nefazodone  Family History  Problem Relation Age of Onset  . Mental retardation Mother   . Stroke Father   . Dementia Father     Social History Social History   Tobacco Use  . Smoking status: Former Smoker    Packs/day: 0.00    Years: 0.00    Pack years:  0.00    Quit date: 92    Years since quitting: 37.6  . Smokeless tobacco: Never Used  Vaping Use  . Vaping Use: Never used  Substance Use Topics  . Alcohol use: Not Currently    Comment: 1 a month  . Drug use: No    Review of Systems  Constitutional: No fever/chills Eyes: No visual changes. ENT: No sore throat. Cardiovascular: Denies chest pain. Respiratory: Denies shortness of breath. Gastrointestinal:  No nausea, no vomiting.  No diarrhea.  No constipation.   Positive for abdominal pain. Genitourinary: Negative for dysuria. Musculoskeletal: Negative for back pain. Skin: Negative for rash. Neurological: Negative for headaches, focal weakness or numbness.   ____________________________________________   PHYSICAL EXAM:  VITAL SIGNS: Vitals:   02/03/20 0757 02/03/20 1125  BP: 134/84 (!) 144/78  Pulse: 60 63  Resp: 16 16  Temp: 97.7 F (36.5 C)   SpO2: 98% 99%      Constitutional: Alert and oriented. Well appearing and in no acute distress. Eyes: Conjunctivae are normal. PERRL. EOMI. Head: Atraumatic. Nose: No congestion/rhinnorhea. Mouth/Throat: Mucous membranes are moist.  Oropharynx non-erythematous. Neck: No stridor. No cervical spine tenderness to palpation. Cardiovascular: Normal rate, regular rhythm. Grossly normal heart sounds.  Good peripheral circulation. Respiratory: Normal respiratory effort.  No retractions. Lungs CTAB. Gastrointestinal: Soft , nondistended. No abdominal bruits. No CVA tenderness bilaterally.  Mild RLQ and epigastric tenderness to palpation, otherwise benign abdomen.  No peritoneal features. Musculoskeletal: No lower extremity tenderness nor edema.  No joint effusions. No signs of acute trauma. Neurologic:  Normal speech and language. No gross focal neurologic deficits are appreciated. No gait instability noted. Skin:  Skin is warm, dry and intact. No rash noted. Psychiatric: Mood and affect are normal. Speech and behavior are normal.  ____________________________________________   LABS (all labs ordered are listed, but only abnormal results are displayed)  Labs Reviewed  COMPREHENSIVE METABOLIC PANEL - Abnormal; Notable for the following components:      Result Value   Glucose, Bld 148 (*)    AST 200 (*)    ALT 133 (*)    Total Bilirubin 1.3 (*)    All other components within normal limits  CBC - Abnormal; Notable for the following components:   Hemoglobin 15.8 (*)    HCT 46.4 (*)    All  other components within normal limits  LIPASE, BLOOD  URINALYSIS, COMPLETE (UACMP) WITH MICROSCOPIC   ____________________________________________  RADIOLOGY  ED MD interpretation: CT abdomen/pelvis pending at time of signout  Official radiology report(s): No results found.  ____________________________________________   PROCEDURES and INTERVENTIONS  Procedure(s) performed (including Critical Care):  Procedures  Medications  iohexol (OMNIPAQUE) 300 MG/ML solution 100 mL (has no administration in time range)    ____________________________________________   INITIAL IMPRESSION / ASSESSMENT AND PLAN / ED COURSE  64 year old woman without significant medical history presenting with intermittent nightly epigastric pain amenable to outpatient follow-up.  Normal vital signs room air.  Reassuring examination with mild tenderness only to her epigastrium and RLQ, no peritoneal features.  She is otherwise without distress or with evidence of acute derangements on examination.  Blood work with marginal elevation to AST/ALT and her T bili, otherwise unremarkable.  Patient reports that she has been tolerating p.o. intake and toileting at her baseline without postprandial pain or changes to her daily functions.  CT imaging of her abdomen/pelvis obtained in the results are pending at signout to oncoming physician, Dr. Derrill Kay.  He agrees to follow-up on these  results.  I suspect the patient will be suitable for discharge home regardless of the CT results because of her normal functioning.  Patient is well connected with her PCP who can assist with longer-term outpatient follow-up and management of these laboratory derangements.  ____________________________________________   FINAL CLINICAL IMPRESSION(S) / ED DIAGNOSES  Final diagnoses:  Epigastric pain     ED Discharge Orders    None       Seeley Hissong   Note:  This document was prepared using Dragon voice recognition software and  may include unintentional dictation errors.   Delton Prairie, MD 02/03/20 [phone]

## 2020-02-03 NOTE — Discharge Instructions (Addendum)
Please seek medical attention for any high fevers, chest pain, shortness of breath, change in behavior, persistent vomiting, bloody stool or any other new or concerning symptoms.  

## 2020-04-17 ENCOUNTER — Ambulatory Visit: Payer: Medicare HMO | Admitting: Family Medicine

## 2020-04-17 ENCOUNTER — Encounter: Payer: Self-pay | Admitting: Unknown Physician Specialty

## 2020-04-17 ENCOUNTER — Ambulatory Visit (INDEPENDENT_AMBULATORY_CARE_PROVIDER_SITE_OTHER): Payer: Medicare HMO | Admitting: Unknown Physician Specialty

## 2020-04-17 ENCOUNTER — Other Ambulatory Visit: Payer: Self-pay

## 2020-04-17 VITALS — BP 129/73 | HR 48 | Temp 98.2°F | Wt 159.8 lb

## 2020-04-17 DIAGNOSIS — R739 Hyperglycemia, unspecified: Secondary | ICD-10-CM | POA: Diagnosis not present

## 2020-04-17 DIAGNOSIS — M722 Plantar fascial fibromatosis: Secondary | ICD-10-CM

## 2020-04-17 DIAGNOSIS — F321 Major depressive disorder, single episode, moderate: Secondary | ICD-10-CM

## 2020-04-17 DIAGNOSIS — E663 Overweight: Secondary | ICD-10-CM | POA: Diagnosis not present

## 2020-04-17 DIAGNOSIS — R69 Illness, unspecified: Secondary | ICD-10-CM | POA: Diagnosis not present

## 2020-04-17 DIAGNOSIS — R7401 Elevation of levels of liver transaminase levels: Secondary | ICD-10-CM

## 2020-04-17 DIAGNOSIS — E559 Vitamin D deficiency, unspecified: Secondary | ICD-10-CM | POA: Diagnosis not present

## 2020-04-17 DIAGNOSIS — F338 Other recurrent depressive disorders: Secondary | ICD-10-CM | POA: Insufficient documentation

## 2020-04-17 MED ORDER — BUPROPION HCL ER (XL) 150 MG PO TB24
150.0000 mg | ORAL_TABLET | Freq: Every day | ORAL | 0 refills | Status: DC
Start: 2020-04-17 — End: 2020-07-12

## 2020-04-17 NOTE — Assessment & Plan Note (Signed)
Start Wellbutrin 150 mg.  Recheck in 1 month

## 2020-04-17 NOTE — Progress Notes (Signed)
BP 129/73   Pulse (!) 48   Temp 98.2 F (36.8 C) (Oral)   Wt 159 lb 12.8 oz (72.5 kg)   SpO2 100%   BMI 27.43 kg/m    Subjective:    Patient ID: Veronica Mills, female    DOB: Oct 17, 1955, 64 y.o.   MRN: [id number]  HPI: Veronica Mills is a 64 y.o. female  Chief Complaint  Patient presents with  . Obesity    Patient returns back to office today for follow up, patient was last seen 12/28/19 and was advised to contiue low dose Contrave. Patient states tat due to insurance issues she stopped medication in August.  . Foot Pain    Patient reports pain on her right foot at her heel for the past month. Patient states that she has been applying ice and using arch supprts with no relief. Patient describes pain in foot as sharp  . Depression    Patients states that she sufferes with seasonal depression and would like to discuss medication to manage symptoms today.    Depression Pt has taken an SSRI in the past.  Had been on Cymbalta but was difficult to get off.  She would like to try Wellbutrin who did well with Contrave.    Plantar fasciitis She has used arch supports ice.    Elevated liver enyzmes During an an ER visit on 8/15  IFG HGB A1C of 6.1%  Wants to f/u  Relevant past medical, surgical, family and social history reviewed and updated as indicated. Interim medical history since our last visit reviewed. Allergies and medications reviewed and updated.  Review of Systems  Per HPI unless specifically indicated above     Objective:    BP 129/73   Pulse (!) 48   Temp 98.2 F (36.8 C) (Oral)   Wt 159 lb 12.8 oz (72.5 kg)   SpO2 100%   BMI 27.43 kg/m   Wt Readings from Last 3 Encounters:  04/17/20 159 lb 12.8 oz (72.5 kg)  02/03/20 163 lb (73.9 kg)  12/28/19 172 lb (78 kg)    Physical Exam Constitutional:      General: She is not in acute distress.    Appearance: Normal appearance. She is well-developed.  HENT:     Head: Normocephalic and atraumatic.  Eyes:      General: Lids are normal. No scleral icterus.       Right eye: No discharge.        Left eye: No discharge.     Conjunctiva/sclera: Conjunctivae normal.  Neck:     Vascular: No carotid bruit or JVD.  Cardiovascular:     Rate and Rhythm: Normal rate and regular rhythm.     Heart sounds: Normal heart sounds.  Pulmonary:     Effort: Pulmonary effort is normal.     Breath sounds: Normal breath sounds.  Abdominal:     Palpations: There is no hepatomegaly or splenomegaly.  Musculoskeletal:        General: Normal range of motion.     Cervical back: Normal range of motion and neck supple.  Skin:    General: Skin is warm and dry.     Coloration: Skin is not pale.     Findings: No rash.  Neurological:     Mental Status: She is alert and oriented to person, place, and time.  Psychiatric:        Behavior: Behavior normal.        Thought Content: Thought content normal.  Judgment: Judgment normal.     Results for orders placed or performed during the hospital encounter of 02/03/20  Lipase, blood  Result Value Ref Range   Lipase 31 11 - 51 U/L  Comprehensive metabolic panel  Result Value Ref Range   Sodium 142 135 - 145 mmol/L   Potassium 3.6 3.5 - 5.1 mmol/L   Chloride 102 98 - 111 mmol/L   CO2 28 22 - 32 mmol/L   Glucose, Bld 148 (H) 70 - 99 mg/dL   BUN 12 8 - 23 mg/dL   Creatinine, Ser 7.78 0.44 - 1.00 mg/dL   Calcium 9.9 8.9 - 24.2 mg/dL   Total Protein 7.6 6.5 - 8.1 g/dL   Albumin 4.6 3.5 - 5.0 g/dL   AST 353 (H) 15 - 41 U/L   ALT 133 (H) 0 - 44 U/L   Alkaline Phosphatase 96 38 - 126 U/L   Total Bilirubin 1.3 (H) 0.3 - 1.2 mg/dL   GFR calc non Af Amer >60 >60 mL/min   GFR calc Af Amer >60 >60 mL/min   Anion gap 12 5 - 15  CBC  Result Value Ref Range   WBC 10.1 4.0 - 10.5 K/uL   RBC 5.11 3.87 - 5.11 MIL/uL   Hemoglobin 15.8 (H) 12.0 - 15.0 g/dL   HCT 61.4 (H) 36 - 46 %   MCV 90.8 80.0 - 100.0 fL   MCH 30.9 26.0 - 34.0 pg   MCHC 34.1 30.0 - 36.0 g/dL   RDW  43.1 54.0 - 08.6 %   Platelets 261 150 - 400 K/uL   nRBC 0.0 0.0 - 0.2 %  Urinalysis, Complete w Microscopic  Result Value Ref Range   Color, Urine STRAW (A) YELLOW   APPearance CLEAR (A) CLEAR   Specific Gravity, Urine 1.016 1.005 - 1.030   pH 7.0 5.0 - 8.0   Glucose, UA NEGATIVE NEGATIVE mg/dL   Hgb urine dipstick NEGATIVE NEGATIVE   Bilirubin Urine NEGATIVE NEGATIVE   Ketones, ur 5 (A) NEGATIVE mg/dL   Protein, ur NEGATIVE NEGATIVE mg/dL   Nitrite NEGATIVE NEGATIVE   Leukocytes,Ua NEGATIVE NEGATIVE   RBC / HPF 0-5 0 - 5 RBC/hpf   WBC, UA 0-5 0 - 5 WBC/hpf   Bacteria, UA NONE SEEN NONE SEEN   Squamous Epithelial / LPF 0-5 0 - 5      Assessment & Plan:   Problem List Items Addressed This Visit      Unprioritized   Depression, major, single episode, moderate (HCC)    Start Wellbutrin 150 mg.  Recheck in 1 month      Relevant Medications   buPROPion (WELLBUTRIN XL) 150 MG 24 hr tablet   Other Relevant Orders   VITAMIN D 25 Hydroxy (Vit-D Deficiency, Fractures)   TSH   Comprehensive metabolic panel   CBC with Differential/Platelet   Overweight    Check Hgb A1C.  Unable to afford Contrave.  Will wee how she does with just Wellbutrin.        Relevant Orders   VITAMIN D 25 Hydroxy (Vit-D Deficiency, Fractures)   Hgb A1c w/o eAG    Other Visit Diagnoses    Plantar fasciitis    -  Primary   Discuss stretching and Magnesium supplements   Elevated transaminase level       Markedly high in the ER.  Probably viral but needs rechecked    Relevant Orders   Comprehensive metabolic panel       Follow up  plan: Return in about 4 weeks (around 05/15/2020).

## 2020-04-17 NOTE — Assessment & Plan Note (Signed)
Check Hgb A1C.  Unable to afford Contrave.  Will wee how she does with just Wellbutrin.

## 2020-04-18 LAB — COMPREHENSIVE METABOLIC PANEL
ALT: 30 IU/L (ref 0–32)
AST: 19 IU/L (ref 0–40)
Albumin/Globulin Ratio: 2 (ref 1.2–2.2)
Albumin: 4.4 g/dL (ref 3.8–4.8)
Alkaline Phosphatase: 70 IU/L (ref 44–121)
BUN/Creatinine Ratio: 22 (ref 12–28)
BUN: 15 mg/dL (ref 8–27)
Bilirubin Total: 0.4 mg/dL (ref 0.0–1.2)
CO2: 25 mmol/L (ref 20–29)
Calcium: 9.5 mg/dL (ref 8.7–10.3)
Chloride: 101 mmol/L (ref 96–106)
Creatinine, Ser: 0.68 mg/dL (ref 0.57–1.00)
GFR calc Af Amer: 108 mL/min/{1.73_m2} (ref >59)
GFR calc non Af Amer: 94 mL/min/{1.73_m2} (ref >59)
Globulin, Total: 2.2 g/dL (ref 1.5–4.5)
Glucose: 99 mg/dL (ref 65–99)
Potassium: 4.3 mmol/L (ref 3.5–5.2)
Sodium: 141 mmol/L (ref 134–144)
Total Protein: 6.6 g/dL (ref 6.0–8.5)

## 2020-04-18 LAB — CBC WITH DIFFERENTIAL/PLATELET
Basophils Absolute: 0 10*3/uL (ref 0.0–0.2)
Basos: 1 %
EOS (ABSOLUTE): 0.2 10*3/uL (ref 0.0–0.4)
Eos: 3 %
Hematocrit: 44.9 % (ref 34.0–46.6)
Hemoglobin: 14.6 g/dL (ref 11.1–15.9)
Immature Grans (Abs): 0 10*3/uL (ref 0.0–0.1)
Immature Granulocytes: 0 %
Lymphocytes Absolute: 2.5 10*3/uL (ref 0.7–3.1)
Lymphs: 36 %
MCH: 30.7 pg (ref 26.6–33.0)
MCHC: 32.5 g/dL (ref 31.5–35.7)
MCV: 94 fL (ref 79–97)
Monocytes Absolute: 0.5 10*3/uL (ref 0.1–0.9)
Monocytes: 7 %
Neutrophils Absolute: 3.6 10*3/uL (ref 1.4–7.0)
Neutrophils: 53 %
Platelets: 254 10*3/uL (ref 150–450)
RBC: 4.76 x10E6/uL (ref 3.77–5.28)
RDW: 12.1 % (ref 11.7–15.4)
WBC: 6.7 10*3/uL (ref 3.4–10.8)

## 2020-04-18 LAB — TSH: TSH: 0.857 u[IU]/mL (ref 0.450–4.500)

## 2020-04-18 LAB — VITAMIN D 25 HYDROXY (VIT D DEFICIENCY, FRACTURES): Vit D, 25-Hydroxy: 34.6 ng/mL (ref 30.0–100.0)

## 2020-04-18 LAB — HGB A1C W/O EAG: Hgb A1c MFr Bld: 5.9 % — ABNORMAL HIGH (ref 4.8–5.6)

## 2020-04-22 ENCOUNTER — Other Ambulatory Visit: Payer: Self-pay | Admitting: Family Medicine

## 2020-04-28 ENCOUNTER — Telehealth: Payer: Self-pay

## 2020-04-28 NOTE — Telephone Encounter (Signed)
PA started for Armour Thyroid 30mg  for patient through Cover My Meds.

## 2020-04-29 ENCOUNTER — Telehealth: Payer: Self-pay

## 2020-04-29 NOTE — Telephone Encounter (Signed)
PA for Armour Thyroid has been approved from 06/22/19 to 06/20/2021

## 2020-04-29 NOTE — Telephone Encounter (Signed)
PA for Amour Thyroid, denied, appeal started

## 2020-05-18 ENCOUNTER — Encounter: Payer: Self-pay | Admitting: Nurse Practitioner

## 2020-05-18 DIAGNOSIS — I700 Atherosclerosis of aorta: Secondary | ICD-10-CM | POA: Insufficient documentation

## 2020-05-20 ENCOUNTER — Encounter: Payer: Self-pay | Admitting: Nurse Practitioner

## 2020-05-20 ENCOUNTER — Ambulatory Visit (INDEPENDENT_AMBULATORY_CARE_PROVIDER_SITE_OTHER): Payer: Medicare HMO | Admitting: Nurse Practitioner

## 2020-05-20 ENCOUNTER — Other Ambulatory Visit: Payer: Self-pay

## 2020-05-20 VITALS — BP 123/67 | HR 68 | Temp 98.2°F | Wt 156.8 lb

## 2020-05-20 DIAGNOSIS — F338 Other recurrent depressive disorders: Secondary | ICD-10-CM | POA: Diagnosis not present

## 2020-05-20 DIAGNOSIS — R69 Illness, unspecified: Secondary | ICD-10-CM | POA: Diagnosis not present

## 2020-05-20 DIAGNOSIS — I700 Atherosclerosis of aorta: Secondary | ICD-10-CM | POA: Diagnosis not present

## 2020-05-20 NOTE — Assessment & Plan Note (Signed)
Noted on CT scan 02/03/2020.  Educated her on this and preventative methods -- continue healthy diet focus and exercise.  May benefit from statin therapy and ASA in future, monitor lipids.

## 2020-05-20 NOTE — Progress Notes (Signed)
BP 123/67   Pulse 68   Temp 98.2 F (36.8 C)   Wt 156 lb 12.8 oz (71.1 kg)   SpO2 100%   BMI 26.91 kg/m    Subjective:    Patient ID: Veronica Mills, female    DOB: 07/15/1955, 64 y.o.   MRN: [id number]  HPI: Veronica Mills is a 64 y.o. female  Chief Complaint  Patient presents with  . Depression   DEPRESSION Started on Wellbutrin at last visit on 04/17/2020.  She does report ringing in ears with this medication -- it is brief periods only that it lasts.  Notices this every day, but reports it as tolerable and does not wish to change medication as it is offering benefit to mood.  Her depression is more seasonal in nature, often tries to "tough it out", but this year was more difficult.    Discussed her past finding of aortic atherosclerosis on CT scan this year, educated her on this and prevention methods. Mood status: stable Satisfied with current treatment?: yes Symptom severity: mild  Duration of current treatment : months Side effects: as above, is tolerating it Medication compliance: good compliance Psychotherapy/counseling: yes, in past Previous psychiatric medications: Cymbalta -- difficult to come off of Depressed mood: no Anxious mood: yes -- can handle this Anhedonia: no Significant weight loss or gain: no Insomnia: none Fatigue: occasional Feelings of worthlessness or guilt: no Impaired concentration/indecisiveness: no Suicidal ideations: no Hopelessness: no Crying spells: very rarely -- usually triggered by something Depression screen [facility] 2/9 05/20/2020 04/17/2020 11/23/2019 11/12/2019 11/08/2018  Decreased Interest 1 2 0 0 0  Down, Depressed, Hopeless 0 2 1 0 0  PHQ - 2 Score 1 4 1  0 0  Altered sleeping 1 1 1  - -  Tired, decreased energy 1 3 1  - -  Change in appetite 0 0 1 - -  Feeling bad or failure about yourself  0 0 0 - -  Trouble concentrating 1 1 0 - -  Moving slowly or fidgety/restless 0 0 0 - -  Suicidal thoughts 0 0 0 - -  PHQ-9 Score 4 9 4  - -   Difficult doing work/chores Somewhat difficult Somewhat difficult - - -    Relevant past medical, surgical, family and social history reviewed and updated as indicated. Interim medical history since our last visit reviewed. Allergies and medications reviewed and updated.  Review of Systems  Constitutional: Negative for activity change, appetite change, diaphoresis, fatigue and fever.  Respiratory: Negative for cough, chest tightness and shortness of breath.   Cardiovascular: Negative for chest pain, palpitations and leg swelling.  Gastrointestinal: Negative.   Endocrine: Negative.   Neurological: Negative.   Psychiatric/Behavioral: Negative for decreased concentration, dysphoric mood and sleep disturbance. The patient is not nervous/anxious.     Per HPI unless specifically indicated above     Objective:    BP 123/67   Pulse 68   Temp 98.2 F (36.8 C)   Wt 156 lb 12.8 oz (71.1 kg)   SpO2 100%   BMI 26.91 kg/m   Wt Readings from Last 3 Encounters:  05/20/20 156 lb 12.8 oz (71.1 kg)  04/17/20 159 lb 12.8 oz (72.5 kg)  02/03/20 163 lb (73.9 kg)    Physical Exam Vitals and nursing note reviewed.  Constitutional:      General: She is awake. She is not in acute distress.    Appearance: She is well-developed and well-groomed. She is not ill-appearing.  HENT:  Head: Normocephalic.     Right Ear: Hearing normal.     Left Ear: Hearing normal.  Eyes:     General: Lids are normal.        Right eye: No discharge.        Left eye: No discharge.     Conjunctiva/sclera: Conjunctivae normal.     Pupils: Pupils are equal, round, and reactive to light.  Neck:     Vascular: No carotid bruit.  Cardiovascular:     Rate and Rhythm: Normal rate and regular rhythm.     Heart sounds: Normal heart sounds. No murmur heard.  No gallop.   Pulmonary:     Effort: Pulmonary effort is normal. No accessory muscle usage or respiratory distress.     Breath sounds: Normal breath sounds.   Abdominal:     General: Bowel sounds are normal.     Palpations: Abdomen is soft.  Musculoskeletal:     Cervical back: Normal range of motion and neck supple.     Right lower leg: No edema.     Left lower leg: No edema.  Skin:    General: Skin is warm and dry.  Neurological:     Mental Status: She is alert and oriented to person, place, and time.  Psychiatric:        Attention and Perception: Attention normal.        Mood and Affect: Mood normal.        Speech: Speech normal.        Behavior: Behavior normal. Behavior is cooperative.        Thought Content: Thought content normal.     Results for orders placed or performed in visit on 04/17/20  VITAMIN D 25 Hydroxy (Vit-D Deficiency, Fractures)  Result Value Ref Range   Vit D, 25-Hydroxy 34.6 30.0 - 100.0 ng/mL  TSH  Result Value Ref Range   TSH 0.857 0.450 - 4.500 uIU/mL  Comprehensive metabolic panel  Result Value Ref Range   Glucose 99 65 - 99 mg/dL   BUN 15 8 - 27 mg/dL   Creatinine, Ser 6.96 0.57 - 1.00 mg/dL   GFR calc non Af Amer 94 >59 mL/min/1.73   GFR calc Af Amer 108 >59 mL/min/1.73   BUN/Creatinine Ratio 22 12 - 28   Sodium 141 134 - 144 mmol/L   Potassium 4.3 3.5 - 5.2 mmol/L   Chloride 101 96 - 106 mmol/L   CO2 25 20 - 29 mmol/L   Calcium 9.5 8.7 - 10.3 mg/dL   Total Protein 6.6 6.0 - 8.5 g/dL   Albumin 4.4 3.8 - 4.8 g/dL   Globulin, Total 2.2 1.5 - 4.5 g/dL   Albumin/Globulin Ratio 2.0 1.2 - 2.2   Bilirubin Total 0.4 0.0 - 1.2 mg/dL   Alkaline Phosphatase 70 44 - 121 IU/L   AST 19 0 - 40 IU/L   ALT 30 0 - 32 IU/L  CBC with Differential/Platelet  Result Value Ref Range   WBC 6.7 3.4 - 10.8 x10E3/uL   RBC 4.76 3.77 - 5.28 x10E6/uL   Hemoglobin 14.6 11.1 - 15.9 g/dL   Hematocrit 29.5 28.4 - 46.6 %   MCV 94 79 - 97 fL   MCH 30.7 26.6 - 33.0 pg   MCHC 32.5 31 - 35 g/dL   RDW 13.2 44.0 - 10.2 %   Platelets 254 150 - 450 x10E3/uL   Neutrophils 53 Not Estab. %   Lymphs 36 Not Estab. %   Monocytes  7 Not Estab. %   Eos 3 Not Estab. %   Basos 1 Not Estab. %   Neutrophils Absolute 3.6 1.40 - 7.00 x10E3/uL   Lymphocytes Absolute 2.5 0 - 3 x10E3/uL   Monocytes Absolute 0.5 0 - 0 x10E3/uL   EOS (ABSOLUTE) 0.2 0.0 - 0.4 x10E3/uL   Basophils Absolute 0.0 0 - 0 x10E3/uL   Immature Granulocytes 0 Not Estab. %   Immature Grans (Abs) 0.0 0.0 - 0.1 x10E3/uL  Hgb A1c w/o eAG  Result Value Ref Range   Hgb A1c MFr Bld 5.9 (H) 4.8 - 5.6 %      Assessment & Plan:   Problem List Items Addressed This Visit      Cardiovascular and Mediastinum   Aortic atherosclerosis (HCC)    Noted on CT scan 02/03/2020.  Educated her on this and preventative methods -- continue healthy diet focus and exercise.  May benefit from statin therapy and ASA in future, monitor lipids.        Other   Seasonal affective disorder (HCC) - Primary    Seasonal in nature, at this time wishes to continue Wellbutrin as is offering benefit and wishes a medication that she can come off of in future without difficulty.  Struggled coming off SNRI in past.  Will continue Wellbutrin daily and adjust as needed based on her current side effect of occasional tinnitus.  Denies SI/HI.  Return in 3 months to meet new PCP.          Follow up plan: Return in about 3 months (around 08/18/2020) for IFG, MOOD -- meet new PCP.

## 2020-05-20 NOTE — Patient Instructions (Signed)
Living With Depression Everyone experiences occasional disappointment, sadness, and loss in their lives. When you are feeling down, blue, or sad for at least 2 weeks in a row, it may mean that you have depression. Depression can affect your thoughts and feelings, relationships, daily activities, and physical health. It is caused by changes in the way your brain functions. If you receive a diagnosis of depression, your health care provider will tell you which type of depression you have and what treatment options are available to you. If you are living with depression, there are ways to help you recover from it and also ways to prevent it from coming back. How to cope with lifestyle changes Coping with stress     Stress is your body's reaction to life changes and events, both good and bad. Stressful situations may include:  Getting married.  The death of a spouse.  Losing a job.  Retiring.  Having a baby. Stress can last just a few hours or it can be ongoing. Stress can play a major role in depression, so it is important to learn both how to cope with stress and how to think about it differently. Talk with your health care provider or a counselor if you would like to learn more about stress reduction. He or she may suggest some stress reduction techniques, such as:  Music therapy. This can include creating music or listening to music. Choose music that you enjoy and that inspires you.  Mindfulness-based meditation. This kind of meditation can be done while sitting or walking. It involves being aware of your normal breaths, rather than trying to control your breathing.  Centering prayer. This is a kind of meditation that involves focusing on a spiritual word or phrase. Choose a word, phrase, or sacred image that is meaningful to you and that brings you peace.  Deep breathing. To do this, expand your stomach and inhale slowly through your nose. Hold your breath for 3-5 seconds, then exhale  slowly, allowing your stomach muscles to relax.  Muscle relaxation. This involves intentionally tensing muscles then relaxing them. Choose a stress reduction technique that fits your lifestyle and personality. Stress reduction techniques take time and practice to develop. Set aside 5-15 minutes a day to do them. Therapists can offer training in these techniques. The training may be covered by some insurance plans. Other things you can do to manage stress include:  Keeping a stress diary. This can help you learn what triggers your stress and ways to control your response.  Understanding what your limits are and saying no to requests or events that lead to a schedule that is too full.  Thinking about how you respond to certain situations. You may not be able to control everything, but you can control how you react.  Adding humor to your life by watching funny films or TV shows.  Making time for activities that help you relax and not feeling guilty about spending your time this way.  Medicines Your health care provider may suggest certain medicines if he or she feels that they will help improve your condition. Avoid using alcohol and other substances that may prevent your medicines from working properly (may interact). It is also important to:  Talk with your pharmacist or health care provider about all the medicines that you take, their possible side effects, and what medicines are safe to take together.  Make it your goal to take part in all treatment decisions (shared decision-making). This includes giving input on   the side effects of medicines. It is best if shared decision-making with your health care provider is part of your total treatment plan. If your health care provider prescribes a medicine, you may not notice the full benefits of it for 4-8 weeks. Most people who are treated for depression need to be on medicine for at least 6-12 months after they feel better. If you are taking  medicines as part of your treatment, do not stop taking medicines without first talking to your health care provider. You may need to have the medicine slowly decreased (tapered) over time to decrease the risk of harmful side effects. Relationships Your health care provider may suggest family therapy along with individual therapy and drug therapy. While there may not be family problems that are causing you to feel depressed, it is still important to make sure your family learns as much as they can about your mental health. Having your family's support can help make your treatment successful. How to recognize changes in your condition Everyone has a different response to treatment for depression. Recovery from major depression happens when you have not had signs of major depression for two months. This may mean that you will start to:  Have more interest in doing activities.  Feel less hopeless than you did 2 months ago.  Have more energy.  Overeat less often, or have better or improving appetite.  Have better concentration. Your health care provider will work with you to decide the next steps in your recovery. It is also important to recognize when your condition is getting worse. Watch for these signs:  Having fatigue or low energy.  Eating too much or too little.  Sleeping too much or too little.  Feeling restless, agitated, or hopeless.  Having trouble concentrating or making decisions.  Having unexplained physical complaints.  Feeling irritable, angry, or aggressive. Get help as soon as you or your family members notice these symptoms coming back. How to get support and help from others How to talk with friends and family members about your condition  Talking to friends and family members about your condition can provide you with one way to get support and guidance. Reach out to trusted friends or family members, explain your symptoms to them, and let them know that you are  working with a health care provider to treat your depression. Financial resources Not all insurance plans cover mental health care, so it is important to check with your insurance carrier. If paying for co-pays or counseling services is a problem, search for a local or county mental health care center. They may be able to offer public mental health care services at low or no cost when you are not able to see a private health care provider. If you are taking medicine for depression, you may be able to get the generic form, which may be less expensive. Some makers of prescription medicines also offer help to patients who cannot afford the medicines they need. Follow these instructions at home:   Get the right amount and quality of sleep.  Cut down on using caffeine, tobacco, alcohol, and other potentially harmful substances.  Try to exercise, such as walking or lifting small weights.  Take over-the-counter and prescription medicines only as told by your health care provider.  Eat a healthy diet that includes plenty of vegetables, fruits, whole grains, low-fat dairy products, and lean protein. Do not eat a lot of foods that are high in solid fats, added sugars, or salt.    Keep all follow-up visits as told by your health care provider. This is important. Contact a health care provider if:  You stop taking your antidepressant medicines, and you have any of these symptoms: ? Nausea. ? Headache. ? Feeling lightheaded. ? Chills and body aches. ? Not being able to sleep (insomnia).  You or your friends and family think your depression is getting worse. Get help right away if:  You have thoughts of hurting yourself or others. If you ever feel like you may hurt yourself or others, or have thoughts about taking your own life, get help right away. You can go to your nearest emergency department or call:  Your local emergency services (911 in the [place]  A suicide crisis helpline, such as the  National Suicide Prevention Lifeline at 1-800-273-8255. This is open 24-hours a day. Summary  If you are living with depression, there are ways to help you recover from it and also ways to prevent it from coming back.  Work with your health care team to create a management plan that includes counseling, stress management techniques, and healthy lifestyle habits. This information is not intended to replace advice given to you by your health care provider. Make sure you discuss any questions you have with your health care provider. Document Revised: 09/29/2018 Document Reviewed: 05/10/2016 Elsevier Patient Education  2020 [employer]  

## 2020-05-20 NOTE — Assessment & Plan Note (Addendum)
Seasonal in nature, at this time wishes to continue Wellbutrin as is offering benefit and wishes a medication that she can come off of in future without difficulty.  Struggled coming off SNRI in past.  Will continue Wellbutrin daily and adjust as needed based on her current side effect of occasional tinnitus.  Denies SI/HI.  Return in 3 months to meet new PCP.

## 2020-07-12 ENCOUNTER — Other Ambulatory Visit: Payer: Self-pay | Admitting: Family Medicine

## 2020-07-12 ENCOUNTER — Other Ambulatory Visit: Payer: Self-pay | Admitting: Unknown Physician Specialty

## 2020-11-11 NOTE — Progress Notes (Signed)
BP 125/68 (BP Location: Left Arm, Cuff Size: Normal)   Pulse (!) 56   Temp 98.4 F (36.9 C) (Oral)   Wt 165 lb 12.8 oz (75.2 kg)   SpO2 98%   BMI 28.46 kg/m    Subjective:    Patient ID: Veronica Mills, female    DOB: Dec 31, 1955, 65 y.o.   MRN: [id number]  HPI: Veronica Mills is a 65 y.o. female  Chief Complaint  Patient presents with  . Diabetes  . Depression  . Weight Check  . Cyst    Pt states she has a small knot like spot on her L heel she would like checked. States it has been there for about 2 years   FOOT PROBLEM Patient has a small bump on the inside of her left foot.  It has continued to get bigger.  States it does not cause her any pain.  She does not want to  ignore it.  WEIGHT DISCUSSION Patient states that she was on Contrave last year which worked well for her.  However, the price went up and she wasn't able to afford it.  Additionally, the Wellbutrin in the medication increased her tinnitus.  Patient does not want to do an injectable or see a weight loss clinic to discuss the possibility of phentermine,  DEPRESSION Patient stopped taking the Wellbutrin due to it worsening her tinnitus. Patient states that her depression is usually seasonal and she likes to come off the medication for part of the year.  Had difficulty coming off of Cymbalta.  Tolerated coming off the Wellbutrin but it caused tinnitus while taking it.  Patient has not seen ENT for the Tinnitus. Denies SI.  [facility] Office Visit from 11/12/2020 in [place]  PHQ-9 Total Score 14     HYPOTHYROIDISM Thyroid control status:controlled Satisfied with current treatment? yes Medication side effects: no Medication compliance: excellent compliance Etiology of hypothyroidism:  Recent dose adjustment:no Fatigue: no Cold intolerance: no Heat intolerance: no Weight gain: no Weight loss: no Constipation: no Diarrhea/loose stools: no Palpitations: no Lower extremity edema:  no Anxiety/depressed mood: yes     Relevant past medical, surgical, family and social history reviewed and updated as indicated. Interim medical history since our last visit reviewed. Allergies and medications reviewed and updated.  Review of Systems  Constitutional: Negative for fever and unexpected weight change.  HENT: Positive for tinnitus.   Eyes: Negative for visual disturbance.  Respiratory: Negative for cough, chest tightness and shortness of breath.   Cardiovascular: Negative for chest pain, palpitations and leg swelling.  Endocrine: Negative for cold intolerance.  Skin:       Bump on inside of left foot  Neurological: Positive for dizziness. Negative for headaches.  Psychiatric/Behavioral: Positive for dysphoric mood. Negative for suicidal ideas.    Per HPI unless specifically indicated above     Objective:    BP 125/68 (BP Location: Left Arm, Cuff Size: Normal)   Pulse (!) 56   Temp 98.4 F (36.9 C) (Oral)   Wt 165 lb 12.8 oz (75.2 kg)   SpO2 98%   BMI 28.46 kg/m   Wt Readings from Last 3 Encounters:  11/12/20 165 lb 12.8 oz (75.2 kg)  05/20/20 156 lb 12.8 oz (71.1 kg)  04/17/20 159 lb 12.8 oz (72.5 kg)    Physical Exam Vitals and nursing note reviewed.  Constitutional:      General: She is not in acute distress.    Appearance: Normal appearance. She is normal  weight. She is not ill-appearing, toxic-appearing or diaphoretic.  HENT:     Head: Normocephalic.     Right Ear: External ear normal.     Left Ear: External ear normal.     Nose: Nose normal.     Mouth/Throat:     Mouth: Mucous membranes are moist.     Pharynx: Oropharynx is clear.  Eyes:     General:        Right eye: No discharge.        Left eye: No discharge.     Extraocular Movements: Extraocular movements intact.     Conjunctiva/sclera: Conjunctivae normal.     Pupils: Pupils are equal, round, and reactive to light.  Cardiovascular:     Rate and Rhythm: Normal rate and regular  rhythm.     Heart sounds: No murmur heard.   Pulmonary:     Effort: Pulmonary effort is normal. No respiratory distress.     Breath sounds: Normal breath sounds. No wheezing or rales.  Musculoskeletal:     Cervical back: Normal range of motion and neck supple.  Skin:    General: Skin is warm and dry.     Capillary Refill: Capillary refill takes less than 2 seconds.       Neurological:     General: No focal deficit present.     Mental Status: She is alert and oriented to person, place, and time. Mental status is at baseline.  Psychiatric:        Mood and Affect: Mood normal.        Behavior: Behavior normal.        Thought Content: Thought content normal.        Judgment: Judgment normal.     Results for orders placed or performed in visit on 04/17/20  VITAMIN D 25 Hydroxy (Vit-D Deficiency, Fractures)  Result Value Ref Range   Vit D, 25-Hydroxy 34.6 30.0 - 100.0 ng/mL  TSH  Result Value Ref Range   TSH 0.857 0.450 - 4.500 uIU/mL  Comprehensive metabolic panel  Result Value Ref Range   Glucose 99 65 - 99 mg/dL   BUN 15 8 - 27 mg/dL   Creatinine, Ser 0.68 0.57 - 1.00 mg/dL   GFR calc non Af Amer 94 >59 mL/min/1.73   GFR calc Af Amer 108 >59 mL/min/1.73   BUN/Creatinine Ratio 22 12 - 28   Sodium 141 134 - 144 mmol/L   Potassium 4.3 3.5 - 5.2 mmol/L   Chloride 101 96 - 106 mmol/L   CO2 25 20 - 29 mmol/L   Calcium 9.5 8.7 - 10.3 mg/dL   Total Protein 6.6 6.0 - 8.5 g/dL   Albumin 4.4 3.8 - 4.8 g/dL   Globulin, Total 2.2 1.5 - 4.5 g/dL   Albumin/Globulin Ratio 2.0 1.2 - 2.2   Bilirubin Total 0.4 0.0 - 1.2 mg/dL   Alkaline Phosphatase 70 44 - 121 IU/L   AST 19 0 - 40 IU/L   ALT 30 0 - 32 IU/L  CBC with Differential/Platelet  Result Value Ref Range   WBC 6.7 3.4 - 10.8 x10E3/uL   RBC 4.76 3.77 - 5.28 x10E6/uL   Hemoglobin 14.6 11.1 - 15.9 g/dL   Hematocrit 44.9 34.0 - 46.6 %   MCV 94 79 - 97 fL   MCH 30.7 26.6 - 33.0 pg   MCHC 32.5 31.5 - 35.7 g/dL   RDW 12.1 11.7  - 15.4 %   Platelets 254 150 - 450 x10E3/uL  Neutrophils 53 Not Estab. %   Lymphs 36 Not Estab. %   Monocytes 7 Not Estab. %   Eos 3 Not Estab. %   Basos 1 Not Estab. %   Neutrophils Absolute 3.6 1.4 - 7.0 x10E3/uL   Lymphocytes Absolute 2.5 0.7 - 3.1 x10E3/uL   Monocytes Absolute 0.5 0.1 - 0.9 x10E3/uL   EOS (ABSOLUTE) 0.2 0.0 - 0.4 x10E3/uL   Basophils Absolute 0.0 0.0 - 0.2 x10E3/uL   Immature Granulocytes 0 Not Estab. %   Immature Grans (Abs) 0.0 0.0 - 0.1 x10E3/uL  Hgb A1c w/o eAG  Result Value Ref Range   Hgb A1c MFr Bld 5.9 (H) 4.8 - 5.6 %      Assessment & Plan:   Problem List Items Addressed This Visit      Endocrine   Hypothyroid    Labs ordered today.  Will make recommendations based on lab results.       Relevant Orders   TSH   T4, free   IFG (impaired fasting glucose)    Recheck A1C, adjust as needed. Continue working on lifestyle habits.      Relevant Orders   HgB A1c     Other   Hyperlipemia    Recheck lipids, continue lifestyle habits for control.      Relevant Orders   Lipid Profile   Comp Met (CMET)   Overweight    Chronic. Contrave worked well but had some adverse effects such as worsening tinnitus due to the Wellbutrin.  Does not want to do an injectable.  Did not want to see weight loss clinic for discussion about Phentermine.      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  Patient is currently experiencing depression. Denies SI. Found Wellbutrin easy to wean off but had trouble with Cymbalta.  Patient will restart Wellbutrin if depression worsens. Discussed with patient that seeing ENT may be beneficial for tinnitus.  If patient goes back on Wellbutrin and tinnitus reoccurs can discuss staying on SNRI which worked well for her depression and she tolerated well year round versus coming off when depression improves.        Other Visit Diagnoses    Mass of left foot       Recommend Obtaining US. Discussed that possibility of a  cyst.   Relevant Orders   US Soft Tissue Head/Neck (NON-THYROID)   Tinnitus of left ear       Recommend patient see ENT for further evaluation and treatment.    Relevant Orders   Ambulatory referral to ENT       Follow up plan: Return in about 1 month (around 12/13/2020) for Depression/Anxiety FU.

## 2020-11-12 ENCOUNTER — Ambulatory Visit (INDEPENDENT_AMBULATORY_CARE_PROVIDER_SITE_OTHER): Payer: Medicare HMO | Admitting: Nurse Practitioner

## 2020-11-12 ENCOUNTER — Encounter: Payer: Self-pay | Admitting: Nurse Practitioner

## 2020-11-12 ENCOUNTER — Other Ambulatory Visit: Payer: Self-pay

## 2020-11-12 ENCOUNTER — Ambulatory Visit: Payer: Medicare HMO | Admitting: Nurse Practitioner

## 2020-11-12 VITALS — BP 125/68 | HR 56 | Temp 98.4°F | Wt 165.8 lb

## 2020-11-12 DIAGNOSIS — F338 Other recurrent depressive disorders: Secondary | ICD-10-CM | POA: Diagnosis not present

## 2020-11-12 DIAGNOSIS — E039 Hypothyroidism, unspecified: Secondary | ICD-10-CM | POA: Diagnosis not present

## 2020-11-12 DIAGNOSIS — H9312 Tinnitus, left ear: Secondary | ICD-10-CM

## 2020-11-12 DIAGNOSIS — R7301 Impaired fasting glucose: Secondary | ICD-10-CM

## 2020-11-12 DIAGNOSIS — E785 Hyperlipidemia, unspecified: Secondary | ICD-10-CM

## 2020-11-12 DIAGNOSIS — R2242 Localized swelling, mass and lump, left lower limb: Secondary | ICD-10-CM

## 2020-11-12 DIAGNOSIS — E663 Overweight: Secondary | ICD-10-CM | POA: Diagnosis not present

## 2020-11-12 DIAGNOSIS — R69 Illness, unspecified: Secondary | ICD-10-CM | POA: Diagnosis not present

## 2020-11-12 MED ORDER — GABAPENTIN 300 MG PO CAPS
300.0000 mg | ORAL_CAPSULE | Freq: Three times a day (TID) | ORAL | 1 refills | Status: DC
Start: 2020-11-12 — End: 2021-03-05

## 2020-11-12 NOTE — Assessment & Plan Note (Signed)
Recheck A1C, adjust as needed. Continue working on lifestyle habits.

## 2020-11-12 NOTE — Assessment & Plan Note (Signed)
Chronic.  Patient is currently experiencing depression. Denies SI. Found Wellbutrin easy to wean off but had trouble with Cymbalta.  Patient will restart Wellbutrin if depression worsens. Discussed with patient that seeing ENT may be beneficial for tinnitus.  If patient goes back on Wellbutrin and tinnitus reoccurs can discuss staying on SNRI which worked well for her depression and she tolerated well year round versus coming off when depression improves.

## 2020-11-12 NOTE — Assessment & Plan Note (Signed)
Chronic. Contrave worked well but had some adverse effects such as worsening tinnitus due to the Wellbutrin.  Does not want to do an injectable.  Did not want to see weight loss clinic for discussion about Phentermine.

## 2020-11-12 NOTE — Assessment & Plan Note (Signed)
Recheck lipids, continue lifestyle habits for control.

## 2020-11-12 NOTE — Assessment & Plan Note (Signed)
Labs ordered today.  Will make recommendations based on lab results. ?

## 2020-11-13 LAB — COMPREHENSIVE METABOLIC PANEL
ALT: 31 IU/L (ref 0–32)
AST: 21 IU/L (ref 0–40)
Albumin/Globulin Ratio: 2 (ref 1.2–2.2)
Albumin: 4.8 g/dL (ref 3.8–4.8)
Alkaline Phosphatase: 68 IU/L (ref 44–121)
BUN/Creatinine Ratio: 19 (ref 12–28)
BUN: 13 mg/dL (ref 8–27)
Bilirubin Total: 0.4 mg/dL (ref 0.0–1.2)
CO2: 26 mmol/L (ref 20–29)
Calcium: 9.9 mg/dL (ref 8.7–10.3)
Chloride: 101 mmol/L (ref 96–106)
Creatinine, Ser: 0.67 mg/dL (ref 0.57–1.00)
Globulin, Total: 2.4 g/dL (ref 1.5–4.5)
Glucose: 91 mg/dL (ref 65–99)
Potassium: 4.2 mmol/L (ref 3.5–5.2)
Sodium: 139 mmol/L (ref 134–144)
Total Protein: 7.2 g/dL (ref 6.0–8.5)
eGFR: 99 mL/min/{1.73_m2} (ref >59)

## 2020-11-13 LAB — LIPID PANEL
Chol/HDL Ratio: 3.1 ratio (ref 0.0–4.4)
Cholesterol, Total: 217 mg/dL — ABNORMAL HIGH (ref 100–199)
HDL: 70 mg/dL (ref >39)
LDL Chol Calc (NIH): 127 mg/dL — ABNORMAL HIGH (ref 0–99)
Triglycerides: 117 mg/dL (ref 0–149)
VLDL Cholesterol Cal: 20 mg/dL (ref 5–40)

## 2020-11-13 LAB — HEMOGLOBIN A1C
Est. average glucose Bld gHb Est-mCnc: 131 mg/dL
Hgb A1c MFr Bld: 6.2 % — ABNORMAL HIGH (ref 4.8–5.6)

## 2020-11-13 LAB — TSH: TSH: 1.38 u[IU]/mL (ref 0.450–4.500)

## 2020-11-13 LAB — T4, FREE: Free T4: 1.01 ng/dL (ref 0.82–1.77)

## 2020-11-13 MED ORDER — ONETOUCH ULTRA VI STRP: ORAL_STRIP | 12 refills | Status: DC

## 2020-11-13 NOTE — Progress Notes (Signed)
Hi Veronica Mills.  It was a pleasure meeting you yesterday. Your lab work shows that your cholesterol is elevated from prior.  Were you fasting yesterday?  Your liver, kidneys and electrolytes look great. Your thyroid is within normal limits.  Continue with your current dose of levothyroxine.  Your A1c remains in the prediabetic range but slightly elevated from prior.  Recommend decreasing the Carbohydrates in your diet and exercising as tolerated. Please let me know if you have any questions.  I will see you at our follow up.

## 2020-11-14 ENCOUNTER — Other Ambulatory Visit: Payer: Self-pay

## 2020-11-14 MED ORDER — ONETOUCH ULTRA VI STRP: ORAL_STRIP | 12 refills | Status: DC

## 2020-11-14 NOTE — Telephone Encounter (Signed)
Pharmacy sent a fax requesting specific directions for strips. RX updated, please resend.

## 2020-11-19 ENCOUNTER — Other Ambulatory Visit: Payer: Self-pay

## 2020-11-19 ENCOUNTER — Encounter: Payer: Self-pay | Admitting: Nurse Practitioner

## 2020-11-19 ENCOUNTER — Ambulatory Visit (INDEPENDENT_AMBULATORY_CARE_PROVIDER_SITE_OTHER): Payer: Medicare HMO | Admitting: Nurse Practitioner

## 2020-11-19 VITALS — BP 122/64 | HR 59 | Temp 98.0°F | Wt 165.0 lb

## 2020-11-19 DIAGNOSIS — R7301 Impaired fasting glucose: Secondary | ICD-10-CM | POA: Diagnosis not present

## 2020-11-19 DIAGNOSIS — M79674 Pain in right toe(s): Secondary | ICD-10-CM

## 2020-11-19 MED ORDER — DOXYCYCLINE HYCLATE 100 MG PO TABS
100.0000 mg | ORAL_TABLET | Freq: Two times a day (BID) | ORAL | 0 refills | Status: DC
Start: 2020-11-19 — End: 2021-01-02

## 2020-11-19 NOTE — Assessment & Plan Note (Signed)
Sugars have been elevated the past few days ranging 120s-140s. A1C was checked a week ago was 6.2%. Encouraged her to keep monitoring her glucose levels, be mindful of carbohydrates, and follow-up with PCP as scheduled.

## 2020-11-19 NOTE — Progress Notes (Signed)
Acute Office Visit  Subjective:    Patient ID: Veronica Mills, female    DOB: June 15, 1956, 65 y.o.   MRN: [id number]  Chief Complaint  Patient presents with  . Toe Pain    Patient states Saturday she was working in high heat and noticed her toe nails turning red, she is having pain in more so in both big toes. Patient has diclofenac at home so has been taking that since Sunday. Patient also was having cramping in both at the time when working in heat. Patient has noticed sugars have been running high since this has happened.     HPI Patient is in today for pain in her bilateral big toes, right worse than left. She was working out in the yard on Saturday, where she got overheated, hot, flushed and dizzy. She went inside to cool down and drink fluids when she started feeling better. She was wearing sandals outside but noticed that all of her toes turned red and were painful. Today, she notices redness around her big-toe nail beds on both feet, right worse than the left. The pain is a 6/10 and she describes it as throbbing. Elevating her feet and using ice has helped. She is also taking diclofenac for pain. Walking and keeping her legs down makes the pain worse. Just touching her toe slightly causes severe pain.    Past Medical History:  Diagnosis Date  . Allergy   . Anxiety   . Bursitis of both hips   . Depression   . Insomnia   . Lyme disease   . Sleep apnea   . Sleep apnea   . Thyroid disease     Past Surgical History:  Procedure Laterality Date  . ABDOMINAL HYSTERECTOMY  06/21/1997  . SHOULDER SURGERY Left 05/2009    Family History  Problem Relation Age of Onset  . Mental retardation Mother   . Stroke Father   . Dementia Father   . Depression Sister   . Depression Daughter   . Diabetes Maternal Grandfather   . Congestive Heart Failure Paternal Grandmother   . Cancer Paternal Grandfather   . Hypertension Sister   . Obesity Sister   . Depression Daughter     Social  History   Socioeconomic History  . Marital status: Married    Spouse name: Not on file  . Number of children: Not on file  . Years of education: Not on file  . Highest education level: Master's degree (e.g., MA, MS, MEng, MEd, MSW, MBA)  Occupational History  . Occupation: disabled   Tobacco Use  . Smoking status: Former Smoker    Packs/day: 0.00    Years: 0.00    Pack years: 0.00    Quit date: 1984    Years since quitting: 38.4  . Smokeless tobacco: Never Used  Vaping Use  . Vaping Use: Never used  Substance and Sexual Activity  . Alcohol use: Not Currently    Comment: 1 a month  . Drug use: No  . Sexual activity: Yes  Other Topics Concern  . Not on file  Social History Narrative   Still new to the area   Social Determinants of Health   Financial Resource Strain: Not on file  Food Insecurity: Not on file  Transportation Needs: Not on file  Physical Activity: Not on file  Stress: Not on file  Social Connections: Not on file  Intimate Partner Violence: Not on file    Outpatient Medications Prior to Visit  Medication Sig Dispense Refill  . ARMOUR THYROID 30 MG tablet TAKE 1 TABLET(30 MG) BY MOUTH DAILY BEFORE BREAKFAST 90 tablet 1  . buPROPion (WELLBUTRIN XL) 150 MG 24 hr tablet TAKE 1 TABLET(150 MG) BY MOUTH DAILY 90 tablet 0  . gabapentin (NEURONTIN) 300 MG capsule Take 1 capsule (300 mg total) by mouth 3 (three) times daily. 180 capsule 1  . glucose blood (ONETOUCH ULTRA) test strip Use one strip to check blood sugar once daily 100 strip 12  . OneTouch Delica Lancets 44W MISC      No facility-administered medications prior to visit.    Allergies  Allergen Reactions  . Plaquenil [Hydroxychloroquine Sulfate] Itching  . Rocephin [Ceftriaxone Sodium In Dextrose] Hives  . Pneumococcal Vaccines Other (See Comments)  . Prednisone Other (See Comments)    Increased pain sxs  . Erythromycin     UTI  . Trazodone And Nefazodone     Makes patient hyper. Ineffective.     Review of Systems  Constitutional: Negative.   HENT: Negative.   Respiratory: Negative.   Cardiovascular: Negative.   Gastrointestinal: Negative.   Genitourinary: Negative.   Musculoskeletal:       Big toe pain/redness bilaterally  Skin: Positive for color change.  Neurological: Positive for dizziness (on Saturday, resolved now).       Objective:    Physical Exam Vitals and nursing note reviewed.  Constitutional:      General: She is not in acute distress.    Appearance: Normal appearance.  HENT:     Head: Normocephalic.  Eyes:     Conjunctiva/sclera: Conjunctivae normal.  Cardiovascular:     Rate and Rhythm: Normal rate and regular rhythm.     Pulses: Normal pulses.     Heart sounds: Normal heart sounds.  Pulmonary:     Effort: Pulmonary effort is normal.     Breath sounds: Normal breath sounds.  Musculoskeletal:        General: Tenderness (right big toe) present.     Cervical back: Normal range of motion.  Skin:    General: Skin is warm.     Findings: Erythema (around right big toe nail, no ingrown toenail noted) present.  Neurological:     General: No focal deficit present.     Mental Status: She is alert and oriented to person, place, and time.  Psychiatric:        Mood and Affect: Mood normal.        Behavior: Behavior normal.        Thought Content: Thought content normal.        Judgment: Judgment normal.     BP 122/64 (BP Location: Left Arm, Patient Position: Sitting)   Pulse (!) 59   Temp 98 F (36.7 C)   Wt 165 lb (74.8 kg)   SpO2 99%   BMI 28.32 kg/m  Wt Readings from Last 3 Encounters:  11/19/20 165 lb (74.8 kg)  11/12/20 165 lb 12.8 oz (75.2 kg)  05/20/20 156 lb 12.8 oz (71.1 kg)    Health Maintenance Due  Topic Date Due  . Zoster Vaccines- Shingrix (1 of 2) Never done    There are no preventive care reminders to display for this patient.   Lab Results  Component Value Date   TSH 1.380 11/12/2020   Lab Results  Component  Value Date   WBC 6.7 04/17/2020   HGB 14.6 04/17/2020   HCT 44.9 04/17/2020   MCV 94 04/17/2020   PLT 254 04/17/2020  Lab Results  Component Value Date   NA 139 11/12/2020   K 4.2 11/12/2020   CO2 26 11/12/2020   GLUCOSE 91 11/12/2020   BUN 13 11/12/2020   CREATININE 0.67 11/12/2020   BILITOT 0.4 11/12/2020   ALKPHOS 68 11/12/2020   AST 21 11/12/2020   ALT 31 11/12/2020   PROT 7.2 11/12/2020   ALBUMIN 4.8 11/12/2020   CALCIUM 9.9 11/12/2020   ANIONGAP 12 02/03/2020   EGFR 99 11/12/2020   Lab Results  Component Value Date   CHOL 217 (H) 11/12/2020   Lab Results  Component Value Date   HDL 70 11/12/2020   Lab Results  Component Value Date   LDLCALC 127 (H) 11/12/2020   Lab Results  Component Value Date   TRIG 117 11/12/2020   Lab Results  Component Value Date   CHOLHDL 3.1 11/12/2020   Lab Results  Component Value Date   HGBA1C 6.2 (H) 11/12/2020       Assessment & Plan:   Problem List Items Addressed This Visit      Endocrine   IFG (impaired fasting glucose)    Sugars have been elevated the past few days ranging 120s-140s. A1C was checked a week ago was 6.2%. Encouraged her to keep monitoring her glucose levels, be mindful of carbohydrates, and follow-up with PCP as scheduled.        Other Visit Diagnoses    Pain of toe of right foot    -  Primary   Cellulitis vs gout. Will treat with doxycycline and check uric acid level. Follow-up if symptoms worsen or don't improve   Relevant Orders   Uric acid       Meds ordered this encounter  Medications  . doxycycline (VIBRA-TABS) 100 MG tablet    Sig: Take 1 tablet (100 mg total) by mouth 2 (two) times daily.    Dispense:  20 tablet    Refill:  0     Charyl Dancer, NP

## 2020-11-20 LAB — URIC ACID: Uric Acid: 3.6 mg/dL (ref 3.0–7.2)

## 2020-11-24 ENCOUNTER — Ambulatory Visit: Payer: Medicare HMO

## 2020-11-28 ENCOUNTER — Ambulatory Visit (INDEPENDENT_AMBULATORY_CARE_PROVIDER_SITE_OTHER): Payer: Medicare HMO

## 2020-11-28 VITALS — Ht 64.0 in | Wt 165.0 lb

## 2020-11-28 DIAGNOSIS — Z0000 Encounter for general adult medical examination without abnormal findings: Secondary | ICD-10-CM | POA: Diagnosis not present

## 2020-11-28 NOTE — Progress Notes (Signed)
I connected with Veronica FredericJoane Henckel today by telephone and verified that I am speaking with the correct person using two identifiers. Location patient: home Location provider: work Persons participating in the virtual visit: Hoy FinlayJoane Mcbrayer, Canden Cieslinski LPN.   I discussed the limitations, risks, security and privacy concerns of performing an evaluation and management service by telephone and the availability of in person appointments. I also discussed with the patient that there may be a patient responsible charge related to this service. The patient expressed understanding and verbally consented to this telephonic visit.    Interactive audio and video telecommunications were attempted between this provider and patient, however failed, due to patient having technical difficulties OR patient did not have access to video capability.  We continued and completed visit with audio only.     Vital signs may be patient reported or missing.  Subjective:   Veronica Mills is a 65 y.o. female who presents for Medicare Annual (Subsequent) preventive examination.  Review of Systems     Cardiac Risk Factors include: dyslipidemia     Objective:    Today's Vitals   11/28/20 1111 11/28/20 1112  Weight: 165 lb (74.8 kg)   Height: 5\' 4"  (1.626 m)   PainSc:  2    Body mass index is 28.32 kg/m.  Advanced Directives 11/28/2020 02/03/2020 11/12/2019  Does Patient Have a Medical Advance Directive? No No No    Current Medications (verified) Outpatient Encounter Medications as of 11/28/2020  Medication Sig   ARMOUR THYROID 30 MG tablet TAKE 1 TABLET(30 MG) BY MOUTH DAILY BEFORE BREAKFAST   doxycycline (VIBRA-TABS) 100 MG tablet Take 1 tablet (100 mg total) by mouth 2 (two) times daily.   gabapentin (NEURONTIN) 300 MG capsule Take 1 capsule (300 mg total) by mouth 3 (three) times daily.   glucose blood (ONETOUCH ULTRA) test strip Use one strip to check blood sugar once daily   OneTouch Delica Lancets 30G MISC     buPROPion (WELLBUTRIN XL) 150 MG 24 hr tablet TAKE 1 TABLET(150 MG) BY MOUTH DAILY (Patient not taking: Reported on 11/28/2020)   No facility-administered encounter medications on file as of 11/28/2020.    Allergies (verified) Plaquenil [hydroxychloroquine sulfate], Rocephin [ceftriaxone sodium in dextrose], Pneumococcal vaccines, Prednisone, Erythromycin, and Trazodone and nefazodone   History: Past Medical History:  Diagnosis Date   Allergy    Anxiety    Bursitis of both hips    Depression    Insomnia    Lyme disease    Sleep apnea    Sleep apnea    Thyroid disease    Past Surgical History:  Procedure Laterality Date   ABDOMINAL HYSTERECTOMY  06/21/1997   SHOULDER SURGERY Left 05/2009   Family History  Problem Relation Age of Onset   Mental retardation Mother    Stroke Father    Dementia Father    Depression Sister    Depression Daughter    Diabetes Maternal Grandfather    Congestive Heart Failure Paternal Grandmother    Cancer Paternal Grandfather    Hypertension Sister    Obesity Sister    Depression Daughter    Social History   Socioeconomic History   Marital status: Married    Spouse name: Not on file   Number of children: Not on file   Years of education: Not on file   Highest education level: Master's degree (e.g., MA, MS, MEng, MEd, MSW, MBA)  Occupational History   Occupation: disabled   Tobacco Use   Smoking status: Former  Packs/day: 0.00    Years: 0.00    Pack years: 0.00    Types: Cigarettes    Quit date: 58    Years since quitting: 38.4   Smokeless tobacco: Never  Vaping Use   Vaping Use: Never used  Substance and Sexual Activity   Alcohol use: Not Currently    Comment: 1 a month   Drug use: No   Sexual activity: Yes  Other Topics Concern   Not on file  Social History Narrative   Still new to the area   Social Determinants of Health   Financial Resource Strain: Low Risk    Difficulty of Paying Living Expenses: Not hard at  all  Food Insecurity: No Food Insecurity   Worried About Programme researcher, broadcasting/film/video in the Last Year: Never true   Ran Out of Food in the Last Year: Never true  Transportation Needs: No Transportation Needs   Lack of Transportation (Medical): No   Lack of Transportation (Non-Medical): No  Physical Activity: Sufficiently Active   Days of Exercise per Week: 4 days   Minutes of Exercise per Session: 40 min  Stress: Stress Concern Present   Feeling of Stress : To some extent  Social Connections: Not on file    Tobacco Counseling Counseling given: Not Answered   Clinical Intake:  Pre-visit preparation completed: Yes  Pain : 0-10 Pain Score: 2  Pain Type: Acute pain Pain Location: Toe (Comment which one) (big toes) Pain Descriptors / Indicators: Discomfort Pain Onset: 1 to 4 weeks ago Pain Frequency: Intermittent Pain Relieving Factors: elevation and epsom salt  Pain Relieving Factors: elevation and epsom salt  Nutritional Status: BMI 25 -29 Overweight Nutritional Risks: None Diabetes: No  How often do you need to have someone help you when you read instructions, pamphlets, or other written materials from your doctor or pharmacy?: 1 - Never What is the last grade level you completed in school?: masters degree  Diabetic? no  Interpreter Needed?: No  Information entered by :: NAllen LPN   Activities of Daily Living In your present state of health, do you have any difficulty performing the following activities: 11/28/2020  Hearing? Y  Comment slight hearing due to accident  Vision? N  Difficulty concentrating or making decisions? N  Walking or climbing stairs? N  Dressing or bathing? N  Doing errands, shopping? N  Preparing Food and eating ? N  Using the Toilet? N  In the past six months, have you accidently leaked urine? N  Do you have problems with loss of bowel control? N  Managing your Medications? N  Managing your Finances? N  Housekeeping or managing your  Housekeeping? N  Some recent data might be hidden    Patient Care Team: Larae Grooms, NP as PCP - General  Indicate any recent Medical Services you may have received from other than [facility] providers in the past year (date may be approximate).     Assessment:   This is a routine wellness examination for Veronica Mills.  Hearing/Vision screen Vision Screening - Comments:: Regular eye exams, Dr. Clydene Pugh  Dietary issues and exercise activities discussed: Current Exercise Habits: Home exercise routine, Type of exercise: walking, Time (Minutes): 45, Frequency (Times/Week): 4, Weekly Exercise (Minutes/Week): 180   Goals Addressed             This Visit's Progress    Patient Stated       11/28/2020, lose weight and lower A1C        Depression  Screen PHQ 2/9 Scores 11/28/2020 11/12/2020 05/20/2020 04/17/2020 11/23/2019 11/12/2019 11/08/2018  PHQ - 2 Score 0 5 1 4 1  0 0  PHQ- 9 Score - 14 4 9 4  - -    Fall Risk Fall Risk  11/28/2020 11/23/2019 11/12/2019 11/08/2018 10/13/2017  Falls in the past year? 0 0 0 0 No  Number falls in past yr: - 0 0 - -  Injury with Fall? - 1 0 - -  Risk for fall due to : Medication side effect - - - -  Follow up Falls evaluation completed;Education provided;Falls prevention discussed - - - -    FALL RISK PREVENTION PERTAINING TO THE HOME:  Any stairs in or around the home? Yes  If so, are there any without handrails? No  Home free of loose throw rugs in walkways, pet beds, electrical cords, etc? Yes  Adequate lighting in your home to reduce risk of falls? Yes   ASSISTIVE DEVICES UTILIZED TO PREVENT FALLS:  Life alert? No  Use of a cane, walker or w/c? No  Grab bars in the bathroom? No  Shower chair or bench in shower? Yes  Elevated toilet seat or a handicapped toilet? Yes   TIMED UP AND GO:  Was the test performed? No .       Cognitive Function:     6CIT Screen 11/28/2020 11/08/2018 10/16/2017  What Year? 0 points 0 points 0 points  What month? 0  points 0 points 0 points  What time? 0 points 0 points 0 points  Count back from 20 0 points 0 points 0 points  Months in reverse 0 points 0 points 0 points  Repeat phrase 0 points 0 points 0 points  Total Score 0 0 0    Immunizations Immunization History  Administered Date(s) Administered   Moderna Sars-Covid-2 Vaccination 04/23/2020, 10/17/2020   PFIZER(Purple Top)SARS-COV-2 Vaccination 09/07/2019, 10/02/2019   Pneumococcal Polysaccharide-23 04/26/1999    TDAP status: Due, Education has been provided regarding the importance of this vaccine. Advised may receive this vaccine at local pharmacy or Health Dept. Aware to provide a copy of the vaccination record if obtained from local pharmacy or Health Dept. Verbalized acceptance and understanding.  Flu Vaccine status: Declined, Education has been provided regarding the importance of this vaccine but patient still declined. Advised may receive this vaccine at local pharmacy or Health Dept. Aware to provide a copy of the vaccination record if obtained from local pharmacy or Health Dept. Verbalized acceptance and understanding.  Pneumococcal vaccine status: Declined,  Education has been provided regarding the importance of this vaccine but patient still declined. Advised may receive this vaccine at local pharmacy or Health Dept. Aware to provide a copy of the vaccination record if obtained from local pharmacy or Health Dept. Verbalized acceptance and understanding.   Covid-19 vaccine status: Completed vaccines  Qualifies for Shingles Vaccine? Yes   Zostavax completed No   Shingrix Completed?: No.    Education has been provided regarding the importance of this vaccine. Patient has been advised to call insurance company to determine out of pocket expense if they have not yet received this vaccine. Advised may also receive vaccine at local pharmacy or Health Dept. Verbalized acceptance and understanding.  Screening Tests Health Maintenance   Topic Date Due   HIV Screening  Never done   Zoster Vaccines- Shingrix (1 of 2) Never done   MAMMOGRAM  11/12/2021 (Originally 08/25/2015)   TETANUS/TDAP  11/12/2021 (Originally 04/11/1977)   INFLUENZA VACCINE  01/19/2021  Fecal DNA (Cologuard)  11/26/2021   COVID-19 Vaccine  Completed   Hepatitis C Screening  Completed   Pneumococcal Vaccine 65-66 Years old  Aged Out   HPV VACCINES  Aged Out   PAP SMEAR-Modifier  Discontinued    Health Maintenance  Health Maintenance Due  Topic Date Due   HIV Screening  Never done   Zoster Vaccines- Shingrix (1 of 2) Never done    Colorectal cancer screening: Type of screening: Cologuard. Completed 11/27/2018. Repeat every 3 years  Mammogram status: decline  Bone Density status: n/a  Lung Cancer Screening: (Low Dose CT Chest recommended if Age 37-80 years, 30 pack-year currently smoking OR have quit w/in 15years.) does not qualify.   Lung Cancer Screening Referral: no  Additional Screening:  Hepatitis C Screening: does qualify; Completed 10/13/2017  Vision Screening: Recommended annual ophthalmology exams for early detection of glaucoma and other disorders of the eye. Is the patient up to date with their annual eye exam?  Yes  Who is the provider or what is the name of the office in which the patient attends annual eye exams? Dr. Clydene Pugh If pt is not established with a provider, would they like to be referred to a provider to establish care? No .   Dental Screening: Recommended annual dental exams for proper oral hygiene  Community Resource Referral / Chronic Care Management: CRR required this visit?  No   CCM required this visit?  No      Plan:     I have personally reviewed and noted the following in the patient's chart:   Medical and social history Use of alcohol, tobacco or illicit drugs  Current medications and supplements including opioid prescriptions.  Functional ability and status Nutritional status Physical  activity Advanced directives List of other physicians Hospitalizations, surgeries, and ER visits in previous 12 months Vitals Screenings to include cognitive, depression, and falls Referrals and appointments  In addition, I have reviewed and discussed with patient certain preventive protocols, quality metrics, and best practice recommendations. A written personalized care plan for preventive services as well as general preventive health recommendations were provided to patient.     Barb Merino, LPN   [id number]   Nurse Notes:

## 2020-11-28 NOTE — Patient Instructions (Signed)
Veronica Mills , Thank you for taking time to come for your Medicare Wellness Visit. I appreciate your ongoing commitment to your health goals. Please review the following plan we discussed and let me know if I can assist you in the future.   Screening recommendations/referrals: Colonoscopy: cologuard 11/27/2018, due 11/26/2021 Mammogram: decline Bone Density: n/a Recommended yearly ophthalmology/optometry visit for glaucoma screening and checkup Recommended yearly dental visit for hygiene and checkup  Vaccinations: Influenza vaccine: decline Pneumococcal vaccine: allergy Tdap vaccine: decline Shingles vaccine: discussed  Covid-19:  10/17/2020, 04/23/2020, 10/02/2019, 09/07/2019  Advanced directives: Advance directive discussed with you today.   Conditions/risks identified: none  Next appointment: Follow up in one year for your annual wellness visit.   Preventive Care 40-64 Years, Female Preventive care refers to lifestyle choices and visits with your health care provider that can promote health and wellness. What does preventive care include? A yearly physical exam. This is also called an annual well check. Dental exams once or twice a year. Routine eye exams. Ask your health care provider how often you should have your eyes checked. Personal lifestyle choices, including: Daily care of your teeth and gums. Regular physical activity. Eating a healthy diet. Avoiding tobacco and drug use. Limiting alcohol use. Practicing safe sex. Taking low-dose aspirin daily starting at age 47. Taking vitamin and mineral supplements as recommended by your health care provider. What happens during an annual well check? The services and screenings done by your health care provider during your annual well check will depend on your age, overall health, lifestyle risk factors, and family history of disease. Counseling  Your health care provider may ask you questions about your: Alcohol use. Tobacco  use. Drug use. Emotional well-being. Home and relationship well-being. Sexual activity. Eating habits. Work and work Statistician. Method of birth control. Menstrual cycle. Pregnancy history. Screening  You may have the following tests or measurements: Height, weight, and BMI. Blood pressure. Lipid and cholesterol levels. These may be checked every 5 years, or more frequently if you are over 44 years old. Skin check. Lung cancer screening. You may have this screening every year starting at age 22 if you have a 30-pack-year history of smoking and currently smoke or have quit within the past 15 years. Fecal occult blood test (FOBT) of the stool. You may have this test every year starting at age 56. Flexible sigmoidoscopy or colonoscopy. You may have a sigmoidoscopy every 5 years or a colonoscopy every 10 years starting at age 35. Hepatitis C blood test. Hepatitis B blood test. Sexually transmitted disease (STD) testing. Diabetes screening. This is done by checking your blood sugar (glucose) after you have not eaten for a while (fasting). You may have this done every 1-3 years. Mammogram. This may be done every 1-2 years. Talk to your health care provider about when you should start having regular mammograms. This may depend on whether you have a family history of breast cancer. BRCA-related cancer screening. This may be done if you have a family history of breast, ovarian, tubal, or peritoneal cancers. Pelvic exam and Pap test. This may be done every 3 years starting at age 37. Starting at age 31, this may be done every 5 years if you have a Pap test in combination with an HPV test. Bone density scan. This is done to screen for osteoporosis. You may have this scan if you are at high risk for osteoporosis. Discuss your test results, treatment options, and if necessary, the need for more tests  with your health care provider. Vaccines  Your health care provider may recommend certain vaccines,  such as: Influenza vaccine. This is recommended every year. Tetanus, diphtheria, and acellular pertussis (Tdap, Td) vaccine. You may need a Td booster every 10 years. Zoster vaccine. You may need this after age 69. Pneumococcal 13-valent conjugate (PCV13) vaccine. You may need this if you have certain conditions and were not previously vaccinated. Pneumococcal polysaccharide (PPSV23) vaccine. You may need one or two doses if you smoke cigarettes or if you have certain conditions. Talk to your health care provider about which screenings and vaccines you need and how often you need them. This information is not intended to replace advice given to you by your health care provider. Make sure you discuss any questions you have with your health care provider. Document Released: 07/04/2015 Document Revised: 02/25/2016 Document Reviewed: 04/08/2015 Elsevier Interactive Patient Education  2017 [employer] Prevention in the Home Falls can cause injuries. They can happen to people of all ages. There are many things you can do to make your home safe and to help prevent falls. What can I do on the outside of my home? Regularly fix the edges of walkways and driveways and fix any cracks. Remove anything that might make you trip as you walk through a door, such as a raised step or threshold. Trim any bushes or trees on the path to your home. Use bright outdoor lighting. Clear any walking paths of anything that might make someone trip, such as rocks or tools. Regularly check to see if handrails are loose or broken. Make sure that both sides of any steps have handrails. Any raised decks and porches should have guardrails on the edges. Have any leaves, snow, or ice cleared regularly. Use sand or salt on walking paths during winter. Clean up any spills in your garage right away. This includes oil or grease spills. What can I do in the bathroom? Use night lights. Install grab bars by the toilet and  in the tub and shower. Do not use towel bars as grab bars. Use non-skid mats or decals in the tub or shower. If you need to sit down in the shower, use a plastic, non-slip stool. Keep the floor dry. Clean up any water that spills on the floor as soon as it happens. Remove soap buildup in the tub or shower regularly. Attach bath mats securely with double-sided non-slip rug tape. Do not have throw rugs and other things on the floor that can make you trip. What can I do in the bedroom? Use night lights. Make sure that you have a light by your bed that is easy to reach. Do not use any sheets or blankets that are too big for your bed. They should not hang down onto the floor. Have a firm chair that has side arms. You can use this for support while you get dressed. Do not have throw rugs and other things on the floor that can make you trip. What can I do in the kitchen? Clean up any spills right away. Avoid walking on wet floors. Keep items that you use a lot in easy-to-reach places. If you need to reach something above you, use a strong step stool that has a grab bar. Keep electrical cords out of the way. Do not use floor polish or wax that makes floors slippery. If you must use wax, use non-skid floor wax. Do not have throw rugs and other things on the  floor that can make you trip. What can I do with my stairs? Do not leave any items on the stairs. Make sure that there are handrails on both sides of the stairs and use them. Fix handrails that are broken or loose. Make sure that handrails are as long as the stairways. Check any carpeting to make sure that it is firmly attached to the stairs. Fix any carpet that is loose or worn. Avoid having throw rugs at the top or bottom of the stairs. If you do have throw rugs, attach them to the floor with carpet tape. Make sure that you have a light switch at the top of the stairs and the bottom of the stairs. If you do not have them, ask someone to add  them for you. What else can I do to help prevent falls? Wear shoes that: Do not have high heels. Have rubber bottoms. Are comfortable and fit you well. Are closed at the toe. Do not wear sandals. If you use a stepladder: Make sure that it is fully opened. Do not climb a closed stepladder. Make sure that both sides of the stepladder are locked into place. Ask someone to hold it for you, if possible. Clearly mark and make sure that you can see: Any grab bars or handrails. First and last steps. Where the edge of each step is. Use tools that help you move around (mobility aids) if they are needed. These include: Canes. Walkers. Scooters. Crutches. Turn on the lights when you go into a dark area. Replace any light bulbs as soon as they burn out. Set up your furniture so you have a clear path. Avoid moving your furniture around. If any of your floors are uneven, fix them. If there are any pets around you, be aware of where they are. Review your medicines with your doctor. Some medicines can make you feel dizzy. This can increase your chance of falling. Ask your doctor what other things that you can do to help prevent falls. This information is not intended to replace advice given to you by your health care provider. Make sure you discuss any questions you have with your health care provider. Document Released: 04/03/2009 Document Revised: 11/13/2015 Document Reviewed: 07/12/2014 Elsevier Interactive Patient Education  2017 [employer].

## 2020-12-08 ENCOUNTER — Other Ambulatory Visit: Payer: Self-pay | Admitting: Nurse Practitioner

## 2020-12-08 ENCOUNTER — Telehealth: Payer: Self-pay | Admitting: Nurse Practitioner

## 2020-12-08 ENCOUNTER — Telehealth: Payer: Self-pay

## 2020-12-08 DIAGNOSIS — R2242 Localized swelling, mass and lump, left lower limb: Secondary | ICD-10-CM

## 2020-12-08 DIAGNOSIS — H9312 Tinnitus, left ear: Secondary | ICD-10-CM

## 2020-12-08 NOTE — Telephone Encounter (Signed)
Referral was sent to [facility]- please give patient information below:      Receiving Location [facility] Address [address] [place], [place], [place] [address] Phone Number [phone] Fax Number [phone]

## 2020-12-08 NOTE — Telephone Encounter (Signed)
New ultrasound order placed

## 2020-12-08 NOTE — Telephone Encounter (Signed)
Copied from CRM [id number]. Topic: General - Other >> Dec 08, 2020  9:40 AM Gaetana Michaelis A wrote: Reason for CRM: Patient would like to be contacted by a member of staff regarding Referral [phone]     Patient would like an update on the status this referral  Please contact to further advise when possible

## 2020-12-08 NOTE — Telephone Encounter (Signed)
Called and left a detailed message for patient letting her know where she was referred to.

## 2020-12-08 NOTE — Telephone Encounter (Signed)
New referral placed.

## 2020-12-15 ENCOUNTER — Ambulatory Visit: Payer: Medicare HMO | Admitting: Nurse Practitioner

## 2020-12-18 DIAGNOSIS — H9313 Tinnitus, bilateral: Secondary | ICD-10-CM | POA: Diagnosis not present

## 2020-12-18 DIAGNOSIS — H903 Sensorineural hearing loss, bilateral: Secondary | ICD-10-CM | POA: Diagnosis not present

## 2020-12-25 ENCOUNTER — Ambulatory Visit: Payer: Medicare HMO | Admitting: Nurse Practitioner

## 2020-12-29 ENCOUNTER — Ambulatory Visit
Admission: RE | Admit: 2020-12-29 | Discharge: 2020-12-29 | Disposition: A | Discharge status: Home / Self Care | Payer: Medicare HMO | Source: Ambulatory Visit | Attending: Nurse Practitioner | Admitting: Nurse Practitioner

## 2020-12-29 ENCOUNTER — Other Ambulatory Visit: Payer: Self-pay

## 2020-12-29 DIAGNOSIS — R2242 Localized swelling, mass and lump, left lower limb: Secondary | ICD-10-CM | POA: Insufficient documentation

## 2021-01-01 NOTE — Progress Notes (Signed)
Hi Seidy.  The ultrasound shows that there is a nodule.  They are not able to identify any specific features to determine what it is.  They would like you to do an MRI for evaluation.  Are you okay with me going ahead and ordering this?

## 2021-01-02 ENCOUNTER — Encounter: Payer: Self-pay | Admitting: Nurse Practitioner

## 2021-01-02 ENCOUNTER — Ambulatory Visit (INDEPENDENT_AMBULATORY_CARE_PROVIDER_SITE_OTHER): Payer: Medicare HMO | Admitting: Nurse Practitioner

## 2021-01-02 ENCOUNTER — Other Ambulatory Visit: Payer: Self-pay

## 2021-01-02 VITALS — BP 132/84 | HR 54 | Temp 97.9°F

## 2021-01-02 DIAGNOSIS — F338 Other recurrent depressive disorders: Secondary | ICD-10-CM | POA: Diagnosis not present

## 2021-01-02 DIAGNOSIS — B351 Tinea unguium: Secondary | ICD-10-CM | POA: Diagnosis not present

## 2021-01-02 DIAGNOSIS — R69 Illness, unspecified: Secondary | ICD-10-CM | POA: Diagnosis not present

## 2021-01-02 NOTE — Assessment & Plan Note (Signed)
Chronic.  Ongoing.  Patient has not started her Wellbutrin but agrees that she does need it.  Does not want to restart Cymbalta due to it being difficult for her to come off.  Patient still would like to come off the medication for half the year. Follow up in 2 months after restarting Wellbutrin for reevaluation.

## 2021-01-02 NOTE — Progress Notes (Signed)
BP 132/84   Pulse (!) 54   Temp 97.9 F (36.6 C)   SpO2 100%    Subjective:    Patient ID: Veronica Mills, female    DOB: [birth year], 65 y.o.   MRN: [id number]  HPI: Veronica Mills is a 65 y.o. female  Chief Complaint  Patient presents with   Depression   Anxiety   toe nail concern    Patient states she finished her antibiotic and now her toenails are purple. Patient states she still gets random aches in her toes.    DEPRESSION/ANXIETY Patient reports that her she saw Dr. Leanne Chang ENT who states she has hearing loss but at this time there is nothing that can be done for her Tinnitus. Patient researched and found that gabapentin can cause tinnitus she has been weaning down her dose and is currently on 300mg .  She did not restart the Wellbutrin.  She does not want to go back on Cymbalta due to it being difficult to come off. Denies SI.    Flowsheet Row Office Visit from 01/02/2021 in [facility]  PHQ-9 Total Score 12      GAD 7 : Generalized Anxiety Score 01/02/2021 11/23/2019  Nervous, Anxious, on Edge 1 1  Control/stop worrying 0 0  Worry too much - different things 0 0  Trouble relaxing 1 1  Restless 0 0  Easily annoyed or irritable 1 1  Afraid - awful might happen 0 0  Total GAD 7 Score 3 3  Anxiety Difficulty Not difficult at all Not difficult at all   TOENAIL Patient states the antibiotics did not help but that she did have some discharge from the toenails.  Now the toenails have discoloration.    Relevant past medical, surgical, family and social history reviewed and updated as indicated. Interim medical history since our last visit reviewed. Allergies and medications reviewed and updated.  Review of Systems  Skin:        Discolored toenails  Psychiatric/Behavioral:  Positive for dysphoric mood. Negative for suicidal ideas. The patient is nervous/anxious.    Per HPI unless specifically indicated above     Objective:    BP 132/84   Pulse (!) 54   Temp  97.9 F (36.6 C)   SpO2 100%   Wt Readings from Last 3 Encounters:  11/28/20 165 lb (74.8 kg)  11/19/20 165 lb (74.8 kg)  11/12/20 165 lb 12.8 oz (75.2 kg)    Physical Exam Vitals and nursing note reviewed.  Constitutional:      General: She is not in acute distress.    Appearance: Normal appearance. She is normal weight. She is not ill-appearing, toxic-appearing or diaphoretic.  HENT:     Head: Normocephalic.     Right Ear: External ear normal.     Left Ear: External ear normal.     Nose: Nose normal.     Mouth/Throat:     Mouth: Mucous membranes are moist.     Pharynx: Oropharynx is clear.  Eyes:     General:        Right eye: No discharge.        Left eye: No discharge.     Extraocular Movements: Extraocular movements intact.     Conjunctiva/sclera: Conjunctivae normal.     Pupils: Pupils are equal, round, and reactive to light.  Cardiovascular:     Rate and Rhythm: Normal rate and regular rhythm.     Heart sounds: No murmur heard. Pulmonary:  Effort: Pulmonary effort is normal. No respiratory distress.     Breath sounds: Normal breath sounds. No wheezing or rales.  Musculoskeletal:     Cervical back: Normal range of motion and neck supple.       Feet:  Skin:    General: Skin is warm and dry.     Capillary Refill: Capillary refill takes less than 2 seconds.  Neurological:     General: No focal deficit present.     Mental Status: She is alert and oriented to person, place, and time. Mental status is at baseline.  Psychiatric:        Mood and Affect: Mood normal.        Behavior: Behavior normal.        Thought Content: Thought content normal.        Judgment: Judgment normal.    Results for orders placed or performed in visit on 11/19/20  Uric acid  Result Value Ref Range   Uric Acid 3.6 3.0 - 7.2 mg/dL      Assessment & Plan:   Problem List Items Addressed This Visit       Other   Seasonal affective disorder (HCC) - Primary    Chronic.  Ongoing.   Patient has not started her Wellbutrin but agrees that she does need it.  Does not want to restart Cymbalta due to it being difficult for her to come off.  Patient still would like to come off the medication for half the year. Follow up in 2 months after restarting Wellbutrin for reevaluation.        Other Visit Diagnoses     Toenail fungus       Referral placed for podiatry for evaluation and treatment of toenail fungus.   Relevant Orders   Ambulatory referral to Podiatry        Follow up plan: Return in about 2 months (around 03/05/2021) for Depression/Anxiety FU.    A total of 20 minutes were spent on this encounter today.  When total time is documented, this includes both the face-to-face and non-face-to-face time personally spent before, during and after the visit on the date of the encounter on depression.

## 2021-01-06 ENCOUNTER — Other Ambulatory Visit: Payer: Self-pay

## 2021-01-06 ENCOUNTER — Ambulatory Visit: Payer: Medicare HMO | Admitting: Podiatry

## 2021-01-06 DIAGNOSIS — S90112A Contusion of left great toe without damage to nail, initial encounter: Secondary | ICD-10-CM

## 2021-01-06 DIAGNOSIS — S90111A Contusion of right great toe without damage to nail, initial encounter: Secondary | ICD-10-CM | POA: Diagnosis not present

## 2021-01-08 ENCOUNTER — Other Ambulatory Visit: Payer: Self-pay | Admitting: Nurse Practitioner

## 2021-01-08 ENCOUNTER — Encounter: Payer: Self-pay | Admitting: Podiatry

## 2021-01-08 NOTE — Progress Notes (Signed)
Subjective:  Patient ID: Veronica Mills, female    DOB: 01/29/1956,  MRN: [id number]  Chief Complaint  Patient presents with   Nail Problem    Discolored nails     65 y.o. female presents with the above complaint.  Patient presents with complaint of bilateral nail contusion.  Patient states that the both nails are discolored left slightly worse than right side.  Patient states that she had has suffered from heat exhaustion may have tripped a little bit.  She was treated with antibiotics has done Epsom salt.  She wanted to make sure that there was nothing going on with the nail.  There is mild hematoma present within the nail.  She would like to make sure that there is no infection.   Review of Systems: Negative except as noted in the HPI. Denies N/V/F/Ch.  Past Medical History:  Diagnosis Date   Allergy    Anxiety    Bursitis of both hips    Depression    Insomnia    Lyme disease    Sleep apnea    Sleep apnea    Thyroid disease     Current Outpatient Medications:    ARMOUR THYROID 30 MG tablet, TAKE 1 TABLET(30 MG) BY MOUTH DAILY BEFORE BREAKFAST, Disp: 90 tablet, Rfl: 2   buPROPion (WELLBUTRIN XL) 150 MG 24 hr tablet, TAKE 1 TABLET(150 MG) BY MOUTH DAILY (Patient not taking: No sig reported), Disp: 90 tablet, Rfl: 0   gabapentin (NEURONTIN) 300 MG capsule, Take 1 capsule (300 mg total) by mouth 3 (three) times daily., Disp: 180 capsule, Rfl: 1   glucose blood (ONETOUCH ULTRA) test strip, Use one strip to check blood sugar once daily, Disp: 100 strip, Rfl: 12   OneTouch Delica Lancets 30G MISC, , Disp: , Rfl:   Social History   Tobacco Use  Smoking Status Former   Packs/day: 0.00   Years: 0.00   Pack years: 0.00   Types: Cigarettes   Quit date: 1984   Years since quitting: 38.5  Smokeless Tobacco Never    Allergies  Allergen Reactions   Plaquenil [Hydroxychloroquine Sulfate] Itching   Rocephin [Ceftriaxone Sodium In Dextrose] Hives   Pneumococcal Vaccines Other (See  Comments)   Prednisone Other (See Comments)    Increased pain sxs   Erythromycin     UTI   Trazodone And Nefazodone     Makes patient hyper. Ineffective.   Objective:  There were no vitals filed for this visit. There is no height or weight on file to calculate BMI. Constitutional Well developed. Well nourished.  Vascular Dorsalis pedis pulses palpable bilaterally. Posterior tibial pulses palpable bilaterally. Capillary refill normal to all digits.  No cyanosis or clubbing noted. Pedal hair growth normal.  Neurologic Normal speech. Oriented to person, place, and time. Epicritic sensation to light touch grossly present bilaterally.  Dermatologic Thickened discolored dystrophic nail noted to bilateral hallux left greater than right side.  Mild hematoma present under the nail.  Less than 10% circumference.  The nail is well attached to the underlying nailbed.  Orthopedic: Normal joint ROM without pain or crepitus bilaterally. No visible deformities. No bony tenderness.   Radiographs: None Assessment:   1. Contusion of right great toe without damage to nail, initial encounter   2. Contusion of left great toe without damage to nail, initial encounter    Plan:  Patient was evaluated and treated and all questions answered.  Bilateral hallux nail contusion with damage to the nail mild -I explained  patient the etiology of nail contusion versus treatment options were discussed.  Given that hematoma is less than 10% I will hold off on taking the nail off.  The nail is well attached to the underlying nailbed.  I discussed with the patient that eventually the nail may follow-up if there is enough damage done to the nail.  Patient states understanding.  For now we will hold off on total nail avulsion if it continues to get worse or if her pain does not get resolved we will discuss removing the nail at that time.  Patient states understanding.  No follow-ups on file.

## 2021-01-08 NOTE — Telephone Encounter (Signed)
Requested Prescriptions  Pending Prescriptions Disp Refills  . ARMOUR THYROID 30 MG tablet [Pharmacy Med Name: THYROID (ARMOUR) 0.5GR (30MG ) TABS] 90 tablet 2    Sig: TAKE 1 TABLET(30 MG) BY MOUTH DAILY BEFORE BREAKFAST     Endocrinology:  Hypothyroid Agents Failed - 01/08/2021  3:17 AM      Failed - TSH needs to be rechecked within 3 months after an abnormal result. Refill until TSH is due.      Passed - TSH in normal range and within 360 days    TSH  Date Value Ref Range Status  11/12/2020 1.380 0.450 - 4.500 uIU/mL Final         Passed - Valid encounter within last 12 months    Recent Outpatient Visits          6 days ago Seasonal affective disorder (HCC)   [facility], NP   1 month ago Pain of toe of right foot   [facility] McElwee, Lauren A, NP   1 month ago Seasonal affective disorder (HCC)   [facility] Larae Grooms, NP   7 months ago Seasonal affective disorder (HCC)   [facility] [place], [place] T, NP   8 months ago Plantar fasciitis   [facility], NP      Future Appointments            In 1 month Gabriel Cirri, NP [facility]   In 10 months  [facility]

## 2021-03-04 NOTE — Progress Notes (Signed)
BP (!) 142/76 (BP Location: Left Arm, Cuff Size: Normal)   Pulse 62   Temp 98.5 F (36.9 C) (Oral)   Ht 5\' 4"  (1.626 m)   Wt 171 lb 6.4 oz (77.7 kg)   SpO2 99%   BMI 29.42 kg/m    Subjective:    Patient ID: , female    DOB: 04/13/1956, 65 y.o.   MRN: [id number]  HPI: Aramis Zobel is a 65 y.o. female  Chief Complaint  Patient presents with   Anxiety   Depression   DEPRESSION/ANXIETY Patient states the Wellbutrin is going okay.  She does get some ringing in her ears but she is able to tolerate it.  States she is currently having trouble with sleep.  At time she is having trouble going to sleep then other nights she wakes up and isn't able to go back to sleep. Denies SI.  Flowsheet Row Office Visit from 03/05/2021 in [facility]  PHQ-9 Total Score 12      GAD 7 : Generalized Anxiety Score 03/05/2021 01/02/2021 11/23/2019  Nervous, Anxious, on Edge 1 1 1   Control/stop worrying 0 0 0  Worry too much - different things 0 0 0  Trouble relaxing 1 1 1   Restless 0 0 0  Easily annoyed or irritable 1 1 1   Afraid - awful might happen 0 0 0  Total GAD 7 Score 3 3 3   Anxiety Difficulty Somewhat difficult Not difficult at all Not difficult at all       Relevant past medical, surgical, family and social history reviewed and updated as indicated. Interim medical history since our last visit reviewed. Allergies and medications reviewed and updated.  Review of Systems  Psychiatric/Behavioral:  Positive for dysphoric mood. Negative for suicidal ideas. The patient is nervous/anxious.    Per HPI unless specifically indicated above     Objective:    BP (!) 142/76 (BP Location: Left Arm, Cuff Size: Normal)   Pulse 62   Temp 98.5 F (36.9 C) (Oral)   Ht 5\' 4"  (1.626 m)   Wt 171 lb 6.4 oz (77.7 kg)   SpO2 99%   BMI 29.42 kg/m   Wt Readings from Last 3 Encounters:  03/05/21 171 lb 6.4 oz (77.7 kg)  11/28/20 165 lb (74.8 kg)  11/19/20 165 lb (74.8 kg)     Physical Exam Vitals and nursing note reviewed.  Constitutional:      General: She is not in acute distress.    Appearance: Normal appearance. She is normal weight. She is not ill-appearing, toxic-appearing or diaphoretic.  HENT:     Head: Normocephalic.     Right Ear: External ear normal.     Left Ear: External ear normal.     Nose: Nose normal.     Mouth/Throat:     Mouth: Mucous membranes are moist.     Pharynx: Oropharynx is clear.  Eyes:     General:        Right eye: No discharge.        Left eye: No discharge.     Extraocular Movements: Extraocular movements intact.     Conjunctiva/sclera: Conjunctivae normal.     Pupils: Pupils are equal, round, and reactive to light.  Cardiovascular:     Rate and Rhythm: Normal rate and regular rhythm.     Heart sounds: No murmur heard. Pulmonary:     Effort: Pulmonary effort is normal. No respiratory distress.     Breath sounds: Normal breath  sounds. No wheezing or rales.  Musculoskeletal:     Cervical back: Normal range of motion and neck supple.  Skin:    General: Skin is warm and dry.     Capillary Refill: Capillary refill takes less than 2 seconds.  Neurological:     General: No focal deficit present.     Mental Status: She is alert and oriented to person, place, and time. Mental status is at baseline.  Psychiatric:        Mood and Affect: Mood normal.        Behavior: Behavior normal.        Thought Content: Thought content normal.        Judgment: Judgment normal.    Results for orders placed or performed in visit on 11/19/20  Uric acid  Result Value Ref Range   Uric Acid 3.6 3.0 - 7.2 mg/dL      Assessment & Plan:   Problem List Items Addressed This Visit       Other   Seasonal affective disorder (HCC) - Primary     Follow up plan: No follow-ups on file.

## 2021-03-05 ENCOUNTER — Ambulatory Visit (INDEPENDENT_AMBULATORY_CARE_PROVIDER_SITE_OTHER): Payer: Medicare HMO | Admitting: Nurse Practitioner

## 2021-03-05 ENCOUNTER — Encounter: Payer: Self-pay | Admitting: Nurse Practitioner

## 2021-03-05 ENCOUNTER — Other Ambulatory Visit: Payer: Self-pay

## 2021-03-05 VITALS — BP 142/76 | HR 62 | Temp 98.5°F | Ht 64.0 in | Wt 171.4 lb

## 2021-03-05 DIAGNOSIS — F338 Other recurrent depressive disorders: Secondary | ICD-10-CM

## 2021-03-05 DIAGNOSIS — R69 Illness, unspecified: Secondary | ICD-10-CM | POA: Diagnosis not present

## 2021-03-05 MED ORDER — BUPROPION HCL ER (XL) 150 MG PO TB24: ORAL_TABLET | ORAL | 1 refills | Status: DC

## 2021-03-05 NOTE — Assessment & Plan Note (Signed)
Chronic.  Controlled.  Continue with current medication regimen of Wellbutrin 150mg .  Patient feels like this is a good dose for her despite her PHQ9 score.  Refills sent today.  Return to clinic in 6 months for reevaluation.  Call sooner if concerns arise.

## 2021-03-17 DIAGNOSIS — H40013 Open angle with borderline findings, low risk, bilateral: Secondary | ICD-10-CM | POA: Diagnosis not present

## 2021-03-17 DIAGNOSIS — H43393 Other vitreous opacities, bilateral: Secondary | ICD-10-CM | POA: Diagnosis not present

## 2021-03-17 DIAGNOSIS — H40053 Ocular hypertension, bilateral: Secondary | ICD-10-CM | POA: Diagnosis not present

## 2021-03-17 DIAGNOSIS — E119 Type 2 diabetes mellitus without complications: Secondary | ICD-10-CM | POA: Diagnosis not present

## 2021-03-17 DIAGNOSIS — Z0100 Encounter for examination of eyes and vision without abnormal findings: Secondary | ICD-10-CM | POA: Diagnosis not present

## 2021-06-30 ENCOUNTER — Telehealth: Payer: Medicare HMO | Admitting: Nurse Practitioner

## 2021-06-30 DIAGNOSIS — J0140 Acute pansinusitis, unspecified: Secondary | ICD-10-CM

## 2021-06-30 MED ORDER — DOXYCYCLINE HYCLATE 100 MG PO TABS: 100.0000 mg | ORAL_TABLET | Freq: Two times a day (BID) | ORAL | 0 refills | Status: AC

## 2021-06-30 NOTE — Progress Notes (Signed)
E-Visit for Sinus Problems  We are sorry that you are not feeling well.  Here is how we plan to help!  Based on what you have shared with me it looks like you have sinusitis.  Sinusitis is inflammation and infection in the sinus cavities of the head.  Based on your presentation I believe you most likely have Acute Bacterial Sinusitis.  This is an infection caused by bacteria and is treated with antibiotics. I have prescribed Doxycycline 100mg by mouth twice a day for 10 days. You may use an oral decongestant such as Mucinex D or if you have glaucoma or high blood pressure use plain Mucinex. Saline nasal spray help and can safely be used as often as needed for congestion.  If you develop worsening sinus pain, fever or notice severe headache and vision changes, or if symptoms are not better after completion of antibiotic, please schedule an appointment with a health care provider.    Sinus infections are not as easily transmitted as other respiratory infection, however we still recommend that you avoid close contact with loved ones, especially the very young and elderly.  Remember to wash your hands thoroughly throughout the day as this is the number one way to prevent the spread of infection!  Home Care: Only take medications as instructed by your medical team. Complete the entire course of an antibiotic. Do not take these medications with alcohol. A steam or ultrasonic humidifier can help congestion.  You can place a towel over your head and breathe in the steam from hot water coming from a faucet. Avoid close contacts especially the very young and the elderly. Cover your mouth when you cough or sneeze. Always remember to wash your hands.  Get Help Right Away If: You develop worsening fever or sinus pain. You develop a severe head ache or visual changes. Your symptoms persist after you have completed your treatment plan.  Make sure you Understand these instructions. Will watch your  condition. Will get help right away if you are not doing well or get worse.  Thank you for choosing an e-visit.  Your e-visit answers were reviewed by a board certified advanced clinical practitioner to complete your personal care plan. Depending upon the condition, your plan could have included both over the counter or prescription medications.  Please review your pharmacy choice. Make sure the pharmacy is open so you can pick up prescription now. If there is a problem, you may contact your provider through MyChart messaging and have the prescription routed to another pharmacy.  Your safety is important to us. If you have drug allergies check your prescription carefully.   For the next 24 hours you can use MyChart to ask questions about today's visit, request a non-urgent call back, or ask for a work or school excuse. You will get an email in the next two days asking about your experience. I hope that your e-visit has been valuable and will speed your recovery.  I spent approximately 5 minutes reviewing the patient's history, current symptoms and coordinating their plan of care today.    Meds ordered this encounter  Medications   doxycycline (VIBRA-TABS) 100 MG tablet    Sig: Take 1 tablet (100 mg total) by mouth 2 (two) times daily for 10 days.    Dispense:  20 tablet    Refill:  0    

## 2021-09-03 ENCOUNTER — Ambulatory Visit (INDEPENDENT_AMBULATORY_CARE_PROVIDER_SITE_OTHER): Payer: Medicare HMO | Admitting: Nurse Practitioner

## 2021-09-03 ENCOUNTER — Other Ambulatory Visit: Payer: Self-pay

## 2021-09-03 ENCOUNTER — Encounter: Payer: Self-pay | Admitting: Nurse Practitioner

## 2021-09-03 VITALS — BP 145/73 | HR 60 | Temp 98.3°F | Wt 177.6 lb

## 2021-09-03 DIAGNOSIS — R030 Elevated blood-pressure reading, without diagnosis of hypertension: Secondary | ICD-10-CM | POA: Diagnosis not present

## 2021-09-03 DIAGNOSIS — G4733 Obstructive sleep apnea (adult) (pediatric): Secondary | ICD-10-CM | POA: Diagnosis not present

## 2021-09-03 DIAGNOSIS — F338 Other recurrent depressive disorders: Secondary | ICD-10-CM | POA: Diagnosis not present

## 2021-09-03 DIAGNOSIS — R69 Illness, unspecified: Secondary | ICD-10-CM | POA: Diagnosis not present

## 2021-09-03 DIAGNOSIS — E039 Hypothyroidism, unspecified: Secondary | ICD-10-CM | POA: Diagnosis not present

## 2021-09-03 DIAGNOSIS — R7301 Impaired fasting glucose: Secondary | ICD-10-CM | POA: Diagnosis not present

## 2021-09-03 DIAGNOSIS — I700 Atherosclerosis of aorta: Secondary | ICD-10-CM | POA: Diagnosis not present

## 2021-09-03 DIAGNOSIS — E785 Hyperlipidemia, unspecified: Secondary | ICD-10-CM | POA: Diagnosis not present

## 2021-09-03 NOTE — Assessment & Plan Note (Signed)
Labs ordered today.  Will make recommendations based on lab results. ?

## 2021-09-03 NOTE — Progress Notes (Signed)
? ?BP (!) 145/73 (BP Location: Left Arm, Cuff Size: Normal)   Pulse 60   Temp 98.3 ?F (36.8 ?C) (Oral)   Wt 177 lb 9.6 oz (80.6 kg)   SpO2 98%   BMI 30.48 kg/m?   ? ?Subjective:  ? ? Patient ID: Veronica Mills, female    DOB: 1956-04-28, 66 y.o.   MRN: [id number] ? ?HPI: ?Veronica Mills is a 66 y.o. female ? ?Chief Complaint  ?Patient presents with  ? Depression  ? Anxiety  ? ?DEPRESSION/ANXIETY ?Patient states that things are going okay.  Patient states she is taking Contrave.  She is working to increase the medication slowly.  She is currently taking 1 tab in the morning and 1 in the evening and tolerating it well.   ? ?Miguel Barrera Office Visit from 09/03/2021 in [place]  ?PHQ-9 Total Score 9  ? ?  ? ?GAD 7 : Generalized Anxiety Score 09/03/2021 03/05/2021 01/02/2021 11/23/2019  ?Nervous, Anxious, on Edge 0 1 1 1   ?Control/stop worrying 1 0 0 0  ?Worry too much - different things 0 0 0 0  ?Trouble relaxing 1 1 1 1   ?Restless 0 0 0 0  ?Easily annoyed or irritable 0 1 1 1   ?Afraid - awful might happen 0 0 0 0  ?Total GAD 7 Score 2 3 3 3   ?Anxiety Difficulty Not difficult at all Somewhat difficult Not difficult at all Not difficult at all  ? ?HYPOTHYROIDISM ?Thyroid control status:controlled ?Satisfied with current treatment? yes ?Medication side effects: no ?Medication compliance: excellent compliance ?Etiology of hypothyroidism:  ?Recent dose adjustment:no ?Fatigue: yes ?Cold intolerance: no ?Heat intolerance: yes ?Weight gain: yes ?Weight loss: no ?Constipation: no ?Diarrhea/loose stools: no ?Palpitations: no ?Lower extremity edema: no ?Anxiety/depressed mood: yes ? ?SLEEP APNEA ?Sleep apnea status: controlled ?Duration: chronic ?Satisfied with current treatment?:  yes ?CPAP use:   uses an oral dental advice ?Sleep quality with CPAP use: good", average ?Treament compliance:good compliance ?Last sleep study:  ?Treatments attempted:  ?Wakes feeling refreshed:  no ?Daytime hypersomnolence:  no ?Fatigue:   yes ?Insomnia:  yes ?Good sleep hygiene:  yes ?Difficulty falling asleep:  yes ?Difficulty staying asleep:  yes ?Snoring bothers bed partner:  yes ?Observed apnea by bed partner: no ?Obesity:  no ?Hypertension: no  ?Pulmonary hypertension:  no ?Coronary artery disease:  no ? ? ?Relevant past medical, surgical, family and social history reviewed and updated as indicated. Interim medical history since our last visit reviewed. ?Allergies and medications reviewed and updated. ? ?Review of Systems  ?Constitutional:  Negative for fever and unexpected weight change.  ?Eyes:  Negative for visual disturbance.  ?Respiratory:  Negative for cough, chest tightness and shortness of breath.   ?Cardiovascular:  Negative for chest pain, palpitations and leg swelling.  ?Endocrine: Negative for cold intolerance.  ?Skin:   ?     Discolored toenails  ?Neurological:  Negative for dizziness and headaches.  ?Psychiatric/Behavioral:  Positive for dysphoric mood and sleep disturbance. Negative for suicidal ideas. The patient is nervous/anxious.   ? ?Per HPI unless specifically indicated above ? ?   ?Objective:  ?  ?BP (!) 145/73 (BP Location: Left Arm, Cuff Size: Normal)   Pulse 60   Temp 98.3 ?F (36.8 ?C) (Oral)   Wt 177 lb 9.6 oz (80.6 kg)   SpO2 98%   BMI 30.48 kg/m?   ?Wt Readings from Last 3 Encounters:  ?09/03/21 177 lb 9.6 oz (80.6 kg)  ?03/05/21 171 lb 6.4 oz (77.7 kg)  ?  11/28/20 165 lb (74.8 kg)  ?  ?Physical Exam ?Vitals and nursing note reviewed.  ?Constitutional:   ?   General: She is not in acute distress. ?   Appearance: Normal appearance. She is normal weight. She is not ill-appearing, toxic-appearing or diaphoretic.  ?HENT:  ?   Head: Normocephalic.  ?   Right Ear: External ear normal.  ?   Left Ear: External ear normal.  ?   Nose: Nose normal.  ?   Mouth/Throat:  ?   Mouth: Mucous membranes are moist.  ?   Pharynx: Oropharynx is clear.  ?Eyes:  ?   General:     ?   Right eye: No discharge.     ?   Left eye: No  discharge.  ?   Extraocular Movements: Extraocular movements intact.  ?   Conjunctiva/sclera: Conjunctivae normal.  ?   Pupils: Pupils are equal, round, and reactive to light.  ?Cardiovascular:  ?   Rate and Rhythm: Normal rate and regular rhythm.  ?   Heart sounds: No murmur heard. ?Pulmonary:  ?   Effort: Pulmonary effort is normal. No respiratory distress.  ?   Breath sounds: Normal breath sounds. No wheezing or rales.  ?Musculoskeletal:  ?   Cervical back: Normal range of motion and neck supple.  ?Skin: ?   General: Skin is warm and dry.  ?   Capillary Refill: Capillary refill takes less than 2 seconds.  ?Neurological:  ?   General: No focal deficit present.  ?   Mental Status: She is alert and oriented to person, place, and time. Mental status is at baseline.  ?Psychiatric:     ?   Mood and Affect: Mood normal.     ?   Behavior: Behavior normal.     ?   Thought Content: Thought content normal.     ?   Judgment: Judgment normal.  ? ? ?Results for orders placed or performed in visit on 11/19/20  ?Uric acid  ?Result Value Ref Range  ? Uric Acid 3.6 3.0 - 7.2 mg/dL  ? ?   ?Assessment & Plan:  ? ?Problem List Items Addressed This Visit   ? ?  ? Cardiovascular and Mediastinum  ? Aortic atherosclerosis ([place]) - Primary  ?  Noted on CT scan 02/03/2020.  May benefit from statin therapy and ASA in future, monitor lipids.  Labs ordered today. Will make recommendations based on lab results. ?  ?  ? Relevant Orders  ? Comp Met (CMET)  ?  ? Respiratory  ? OSA (obstructive sleep apnea)  ?  Chronic.  Controlled.  Continue with current medication regimen of Dental device.  Labs ordered today.  Return to clinic in 6 months for reevaluation.  Call sooner if concerns arise.  ? ?  ?  ?  ? Endocrine  ? Hypothyroid  ?  Chronic.  Controlled.  Continue with current medication regimen of Amour Thyroid 75m daily.  Refill sent today.  Labs ordered today.  Return to clinic in 6 months for reevaluation.  Call sooner if concerns arise.  ? ?   ?  ? Relevant Orders  ? TSH  ? T4, free  ? IFG (impaired fasting glucose)  ?  Labs ordered today. Will make recommendations based on lab results.   ?  ?  ? Relevant Orders  ? Comp Met (CMET)  ? HgB A1c  ?  ? Other  ? Hyperlipemia  ?  Labs ordered today. Will make recommendations  based on lab results.  ?  ?  ? Relevant Orders  ? Lipid Profile  ? 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  Chronic. Improved from last visit.  Is taking Contrave and receiving Wellbutrin through Contrave.  Scores improved from last visit.  Currently taking 133m of Wellbutrin.  She is going to increase the dose as she tolerates the contrave. Will reassess at next visit.  Recommend continuing with medication at this time. Follow up in 6 months for reevaluation. ?  ?  ? ?Other Visit Diagnoses   ? ? Elevated blood pressure reading      ? Elevated at visit today. Will check at next visit and discuss medication at that time if it is still elevated.  Patient is working on diet and weight loss.  ? ?  ?  ? ?Follow up plan: ?Return in about 6 months (around 03/06/2022) for Physical and Fasting labs. ? ? ? ?A total of 20 minutes were spent on this encounter today.  When total time is documented, this includes both the face-to-face and non-face-to-face time personally spent before, during and after the visit on the date of the encounter on depression.  ? ? ? ?

## 2021-09-03 NOTE — Assessment & Plan Note (Signed)
Chronic.  Controlled.  Continue with current medication regimen of Dental device.  Labs ordered today.  Return to clinic in 6 months for reevaluation.  Call sooner if concerns arise.   

## 2021-09-03 NOTE — Assessment & Plan Note (Signed)
Chronic.  Controlled.  Continue with current medication regimen of Amour Thyroid 30mg  daily.  Refill sent today.  Labs ordered today.  Return to clinic in 6 months for reevaluation.  Call sooner if concerns arise.  ? ?

## 2021-09-03 NOTE — Assessment & Plan Note (Signed)
Noted on CT scan 02/03/2020.  May benefit from statin therapy and ASA in future, monitor lipids.  Labs ordered today. Will make recommendations based on lab results. 

## 2021-09-03 NOTE — Assessment & Plan Note (Signed)
Chronic. Improved from last visit.  Is taking Contrave and receiving Wellbutrin through Contrave.  Scores improved from last visit.  Currently taking 180mg  of Wellbutrin.  She is going to increase the dose as she tolerates the contrave. Will reassess at next visit.  Recommend continuing with medication at this time. Follow up in 6 months for reevaluation. ?

## 2021-09-04 LAB — COMPREHENSIVE METABOLIC PANEL
ALT: 42 IU/L — ABNORMAL HIGH (ref 0–32)
AST: 30 IU/L (ref 0–40)
Albumin/Globulin Ratio: 1.8 (ref 1.2–2.2)
Albumin: 4.7 g/dL (ref 3.8–4.8)
Alkaline Phosphatase: 75 IU/L (ref 44–121)
BUN/Creatinine Ratio: 19 (ref 12–28)
BUN: 14 mg/dL (ref 8–27)
Bilirubin Total: 0.4 mg/dL (ref 0.0–1.2)
CO2: 23 mmol/L (ref 20–29)
Calcium: 9.7 mg/dL (ref 8.7–10.3)
Chloride: 98 mmol/L (ref 96–106)
Creatinine, Ser: 0.72 mg/dL (ref 0.57–1.00)
Globulin, Total: 2.6 g/dL (ref 1.5–4.5)
Glucose: 116 mg/dL — ABNORMAL HIGH (ref 70–99)
Potassium: 4.1 mmol/L (ref 3.5–5.2)
Sodium: 136 mmol/L (ref 134–144)
Total Protein: 7.3 g/dL (ref 6.0–8.5)
eGFR: 94 mL/min/{1.73_m2} (ref >59)

## 2021-09-04 LAB — LIPID PANEL
Chol/HDL Ratio: 3.4 ratio (ref 0.0–4.4)
Cholesterol, Total: 220 mg/dL — ABNORMAL HIGH (ref 100–199)
HDL: 64 mg/dL (ref >39)
LDL Chol Calc (NIH): 136 mg/dL — ABNORMAL HIGH (ref 0–99)
Triglycerides: 116 mg/dL (ref 0–149)
VLDL Cholesterol Cal: 20 mg/dL (ref 5–40)

## 2021-09-04 LAB — HEMOGLOBIN A1C
Est. average glucose Bld gHb Est-mCnc: 128 mg/dL
Hgb A1c MFr Bld: 6.1 % — ABNORMAL HIGH (ref 4.8–5.6)

## 2021-09-04 LAB — TSH: TSH: 2.07 u[IU]/mL (ref 0.450–4.500)

## 2021-09-04 LAB — T4, FREE: Free T4: 1.01 ng/dL (ref 0.82–1.77)

## 2021-09-04 NOTE — Progress Notes (Signed)
Hi Veronica Mills. It was nice to see you yesterday.  Your lab work looks good. A1c is well controlled at 6.1.  Keep up the good work.  Your Cholesterol is slightly elevated from prior.  Recommend a low fat diet and exercise. Please let patient know that her cholesterol is elevated.  Her cardiac risk score puts her at high risk of having a stroke or heart attack over the next 10 years.  I recommend that she start a statin called crestor 5mg  daily.  The goal will be to increase this to 20mg  daily if patient tolerates it well.  If she agrees to the medication I can send it to the pharmacy.  Otherwise her lab work looks great.  We will continue to monitor in the future. ? ? ?The 10-year ASCVD risk score (Arnett DK, et al., 2019) is: 10.6% ?  Values used to calculate the score: ?    Age: 66 years ?    Sex: Female ?    Is Non-Hispanic African American: No ?    Diabetic: Yes ?    Tobacco smoker: No ?    Systolic Blood Pressure: [id number] mmHg ?    Is BP treated: No ?    HDL Cholesterol: 64 mg/dL ?    Total Cholesterol: 220 mg/dL ?

## 2021-10-01 DIAGNOSIS — H43811 Vitreous degeneration, right eye: Secondary | ICD-10-CM | POA: Diagnosis not present

## 2021-10-01 DIAGNOSIS — H5319 Other subjective visual disturbances: Secondary | ICD-10-CM | POA: Diagnosis not present

## 2021-10-18 ENCOUNTER — Other Ambulatory Visit: Payer: Self-pay | Admitting: Nurse Practitioner

## 2021-10-19 NOTE — Telephone Encounter (Signed)
Requested Prescriptions  ?Pending Prescriptions Disp Refills  ?? ARMOUR THYROID 30 MG tablet [Pharmacy Med Name: THYROID (ARMOUR) 0.5GR (30MG ) TABS] 90 tablet 3  ?  Sig: TAKE 1 TABLET BY MOUTH DAILY BEFORE BREAKFAST  ?  ? Endocrinology:  Hypothyroid Agents Passed - 10/18/2021 10:05 AM  ?  ?  Passed - TSH in normal range and within 360 days  ?  TSH  ?Date Value Ref Range Status  ?09/03/2021 2.070 0.450 - 4.500 uIU/mL Final  ?   ?  ?  Passed - Valid encounter within last 12 months  ?  Recent Outpatient Visits   ?      ? 1 month ago Aortic atherosclerosis ([place])  ? [place], NP  ? 7 months ago Seasonal affective disorder [facility])  ? [place], NP  ? 9 months ago Seasonal affective disorder [facility])  ? [place], NP  ? 11 months ago Pain of toe of right foot  ? [place], Lauren A, NP  ? 11 months ago Seasonal affective disorder ([place])  ? [facility] Jon Billings, NP  ?  ?  ?Future Appointments   ?        ? In 1 month  [place]  ? In 4 months Jon Billings, NP [facility], [facility]  ?  ? ?  ?  ?  ? ?

## 2021-10-30 DIAGNOSIS — H5319 Other subjective visual disturbances: Secondary | ICD-10-CM | POA: Diagnosis not present

## 2021-10-30 DIAGNOSIS — H43811 Vitreous degeneration, right eye: Secondary | ICD-10-CM | POA: Diagnosis not present

## 2021-11-30 ENCOUNTER — Ambulatory Visit (INDEPENDENT_AMBULATORY_CARE_PROVIDER_SITE_OTHER): Payer: Medicare HMO | Admitting: *Deleted

## 2021-11-30 DIAGNOSIS — Z0000 Encounter for general adult medical examination without abnormal findings: Secondary | ICD-10-CM | POA: Diagnosis not present

## 2021-11-30 NOTE — Progress Notes (Signed)
Subjective:   Veronica Mills is a 66 y.o. female who presents for Medicare Annual (Subsequent) preventive examination.  I connected with  Veronica Mills on 11/30/21 by a telephone enabled telemedicine application and verified that I am speaking with the correct person using two identifiers.   I discussed the limitations of evaluation and management by telemedicine. The patient expressed understanding and agreed to proceed.  Patient location: home  Provider location: Tele-Health- home   Review of Systems     Cardiac Risk Factors include: advanced age (>104men, >74 women);family history of premature cardiovascular disease     Objective:    Today's Vitals   There is no height or weight on file to calculate BMI.     11/30/2021   10:50 AM 11/28/2020   11:19 AM 02/03/2020    2:33 AM 11/12/2019    9:11 AM  Advanced Directives  Does Patient Have a Medical Advance Directive? No No No No  Would patient like information on creating a medical advance directive? No - Patient declined       Current Medications (verified) Outpatient Encounter Medications as of 11/30/2021  Medication Sig   ARMOUR THYROID 30 MG tablet TAKE 1 TABLET BY MOUTH DAILY BEFORE BREAKFAST   glucose blood (ONETOUCH ULTRA) test strip Use one strip to check blood sugar once daily   Naltrexone-buPROPion HCl ER (CONTRAVE) 8-90 MG TB12 Take 1 tablet by mouth 2 (two) times daily.   OneTouch Delica Lancets 30G MISC    No facility-administered encounter medications on file as of 11/30/2021.    Allergies (verified) Plaquenil [hydroxychloroquine sulfate], Rocephin [ceftriaxone sodium in dextrose], Pneumococcal vaccines, Prednisone, Erythromycin, and Trazodone and nefazodone   History: Past Medical History:  Diagnosis Date   Allergy    Anxiety    Bursitis of both hips    Depression    Insomnia    Lyme disease    Sleep apnea    Sleep apnea    Thyroid disease    Past Surgical History:  Procedure Laterality Date    SHOULDER SURGERY Left 05/2009   TOTAL ABDOMINAL HYSTERECTOMY  06/21/1997   Family History  Problem Relation Age of Onset   Mental retardation Mother    Stroke Father    Dementia Father    Depression Sister    Depression Daughter    Diabetes Maternal Grandfather    Congestive Heart Failure Paternal Grandmother    Cancer Paternal Grandfather    Hypertension Sister    Obesity Sister    Depression Daughter    Social History   Socioeconomic History   Marital status: Married    Spouse name: Not on file   Number of children: Not on file   Years of education: Not on file   Highest education level: Master's degree (e.g., MA, MS, MEng, MEd, MSW, MBA)  Occupational History   Occupation: disabled   Tobacco Use   Smoking status: Former    Packs/day: 0.00    Years: 0.00    Total pack years: 0.00    Types: Cigarettes    Quit date: 1984    Years since quitting: 39.4   Smokeless tobacco: Never  Vaping Use   Vaping Use: Never used  Substance and Sexual Activity   Alcohol use: Not Currently    Comment: 1 a month   Drug use: No   Sexual activity: Yes  Other Topics Concern   Not on file  Social History Narrative   Still new to the area   Social Determinants  of Health   Financial Resource Strain: Low Risk  (11/30/2021)   Overall Financial Resource Strain (CARDIA)    Difficulty of Paying Living Expenses: Not hard at all  Food Insecurity: No Food Insecurity (11/30/2021)   Hunger Vital Sign    Worried About Running Out of Food in the Last Year: Never true    Ran Out of Food in the Last Year: Never true  Transportation Needs: No Transportation Needs (11/30/2021)   PRAPARE - Administrator, Civil Service (Medical): No    Lack of Transportation (Non-Medical): No  Physical Activity: Sufficiently Active (11/30/2021)   Exercise Vital Sign    Days of Exercise per Week: 4 days    Minutes of Exercise per Session: 40 min  Stress: No Stress Concern Present (11/30/2021)   [employer] of Occupational Health - Occupational Stress Questionnaire    Feeling of Stress : Only a little  Social Connections: Moderately Isolated (11/30/2021)   Social Connection and Isolation Panel [NHANES]    Frequency of Communication with Friends and Family: More than three times a week    Frequency of Social Gatherings with Friends and Family: Never    Attends Religious Services: Never    Diplomatic Services operational officer: No    Attends Engineer, structural: Never    Marital Status: Married    Tobacco Counseling Counseling given: Not Answered   Clinical Intake:  Pre-visit preparation completed: Yes  Pain : No/denies pain     Nutritional Risks: None Diabetes:  (pre diabetic  checks 1x a day)  How often do you need to have someone help you when you read instructions, pamphlets, or other written materials from your doctor or pharmacy?: 1 - Never  Diabetic?  Pre diabetic   does check 1 x a day  Interpreter Needed?: No  Information entered by :: Remi Haggard LPN   Activities of Daily Living    11/30/2021   10:51 AM  In your present state of health, do you have any difficulty performing the following activities:  Hearing? 0  Vision? 0  Difficulty concentrating or making decisions? 0  Walking or climbing stairs? 0  Dressing or bathing? 0  Doing errands, shopping? 0  Preparing Food and eating ? N  Using the Toilet? N  In the past six months, have you accidently leaked urine? N  Do you have problems with loss of bowel control? N  Managing your Medications? N  Managing your Finances? N  Housekeeping or managing your Housekeeping? N    Patient Care Team: Larae Grooms, NP as PCP - General  Indicate any recent Medical Services you may have received from other than [facility] providers in the past year (date may be approximate).     Assessment:   This is a routine wellness examination for Veronica Mills.  Hearing/Vision screen Hearing Screening - Comments::  No trouble hearing Vision Screening - Comments:: Dr. Clydene Pugh Up to date  Dietary issues and exercise activities discussed: Current Exercise Habits: Home exercise routine, Time (Minutes): 30, Frequency (Times/Week): 3, Weekly Exercise (Minutes/Week): 90, Intensity: Mild   Goals Addressed             This Visit's Progress    Patient Stated       Would like to loose weight       Depression Screen    11/30/2021   10:38 AM 09/03/2021    8:48 AM 03/05/2021   12:11 PM 01/02/2021    9:32 AM 11/28/2020  11:20 AM 11/12/2020   11:30 AM 05/20/2020   10:36 AM  PHQ 2/9 Scores  PHQ - 2 Score 4 2 2 3  0 5 1  PHQ- 9 Score 8 9 12 12  14 4     Fall Risk    11/30/2021   10:50 AM 09/03/2021    8:48 AM 11/28/2020   11:20 AM 11/23/2019   11:08 AM 11/12/2019    9:00 AM  Fall Risk   Falls in the past year? 0 0 0 0 0  Number falls in past yr: 0 0  0 0  Injury with Fall? 0 0  1 0  Risk for fall due to :  No Fall Risks Medication side effect    Follow up Falls evaluation completed;Education provided;Falls prevention discussed Falls evaluation completed Falls evaluation completed;Education provided;Falls prevention discussed      FALL RISK PREVENTION PERTAINING TO THE HOME:  Any stairs in or around the home? Yes  If so, are there any without handrails? No  Home free of loose throw rugs in walkways, pet beds, electrical cords, etc? Yes  Adequate lighting in your home to reduce risk of falls? Yes   ASSISTIVE DEVICES UTILIZED TO PREVENT FALLS:  Life alert? No  Use of a cane, walker or w/c? No  Grab bars in the bathroom? No  Shower chair or bench in shower? Yes  Elevated toilet seat or a handicapped toilet? No   TIMED UP AND GO:  Was the test performed? No .   Cognitive Function:        11/30/2021   10:35 AM 11/28/2020   11:23 AM 11/08/2018   11:06 AM 10/16/2017   11:45 AM  6CIT Screen  What Year? 0 points 0 points 0 points 0 points  What month? 0 points 0 points 0 points 0 points   What time? 0 points 0 points 0 points 0 points  Count back from 20 0 points 0 points 0 points 0 points  Months in reverse 0 points 0 points 0 points 0 points  Repeat phrase 0 points 0 points 0 points 0 points  Total Score 0 points 0 points 0 points 0 points    Immunizations Immunization History  Administered Date(s) Administered   Moderna Sars-Covid-2 Vaccination 04/23/2020, 10/17/2020   PFIZER(Purple Top)SARS-COV-2 Vaccination 09/07/2019, 10/02/2019   Pneumococcal Polysaccharide-23 04/26/1999    TDAP status: Due, Education has been provided regarding the importance of this vaccine. Advised may receive this vaccine at local pharmacy or Health Dept. Aware to provide a copy of the vaccination record if obtained from local pharmacy or Health Dept. Verbalized acceptance and understanding.  Flu Vaccine status: Declined, Education has been provided regarding the importance of this vaccine but patient still declined. Advised may receive this vaccine at local pharmacy or Health Dept. Aware to provide a copy of the vaccination record if obtained from local pharmacy or Health Dept. Verbalized acceptance and understanding.  Pneumococcal vaccine status: Declined,  Education has been provided regarding the importance of this vaccine but patient still declined. Advised may receive this vaccine at local pharmacy or Health Dept. Aware to provide a copy of the vaccination record if obtained from local pharmacy or Health Dept. Verbalized acceptance and understanding.   Covid-19 vaccine status: Information provided on how to obtain vaccines.   Qualifies for Shingles Vaccine? Yes   Zostavax completed No   Shingrix Completed?: No.    Education has been provided regarding the importance of this vaccine. Patient has been advised to  call insurance company to determine out of pocket expense if they have not yet received this vaccine. Advised may also receive vaccine at local pharmacy or Health Dept. Verbalized  acceptance and understanding.  Screening Tests Health Maintenance  Topic Date Due   URINE MICROALBUMIN  Never done   Fecal DNA (Cologuard)  11/26/2021   Zoster Vaccines- Shingrix (1 of 2) 12/04/2021 (Originally 04/11/2008)   COVID-19 Vaccine (5 - Pfizer series) 12/16/2021 (Originally 12/12/2020)   HIV Screening  03/05/2022 (Originally 04/11/1973)   MAMMOGRAM  12/01/2022 (Originally 08/25/2015)   TETANUS/TDAP  12/01/2022 (Originally 04/11/1977)   INFLUENZA VACCINE  01/19/2022   Hepatitis C Screening  Completed   HPV VACCINES  Aged Out   PAP SMEAR-Modifier  Discontinued    Health Maintenance  Health Maintenance Due  Topic Date Due   URINE MICROALBUMIN  Never done   Fecal DNA (Cologuard)  11/26/2021    Colorectal cancer screening: Type of screening: Cologuard. Completed 2021. Repeat every 3 years  Mammogram Declined    Lung Cancer Screening: (Low Dose CT Chest recommended if Age 80-80 years, 30 pack-year currently smoking OR have quit w/in 15years.) does not qualify.   Lung Cancer Screening Referral:   Additional Screening:  Hepatitis C Screening: does not qualify; Completed 2019  Vision Screening: Recommended annual ophthalmology exams for early detection of glaucoma and other disorders of the eye. Is the patient up to date with their annual eye exam?  Yes  Who is the provider or what is the name of the office in which the patient attends annual eye exams? [facility] If pt is not established with a provider, would they like to be referred to a provider to establish care? No .   Dental Screening: Recommended annual dental exams for proper oral hygiene  Community Resource Referral / Chronic Care Management: CRR required this visit?  No   CCM required this visit?  No      Plan:     I have personally reviewed and noted the following in the patient's chart:   Medical and social history Use of alcohol, tobacco or illicit drugs  Current medications and supplements  including opioid prescriptions.  Functional ability and status Nutritional status Physical activity Advanced directives List of other physicians Hospitalizations, surgeries, and ER visits in previous 12 months Vitals Screenings to include cognitive, depression, and falls Referrals and appointments  In addition, I have reviewed and discussed with patient certain preventive protocols, quality metrics, and best practice recommendations. A written personalized care plan for preventive services as well as general preventive health recommendations were provided to patient.     Leroy Kennedy, LPN   [id number]   Nurse Notes: Will discuss Cologuard at appointment 03-08-2022 [facility]

## 2021-11-30 NOTE — Patient Instructions (Signed)
Veronica Mills , Thank you for taking time to come for your Medicare Wellness Visit. I appreciate your ongoing commitment to your health goals. Please review the following plan we discussed and let me know if I can assist you in the future.   Screening recommendations/referrals: Colonoscopy: Cologuard  due 2023 Discuss at upcoming appointment Mammogram: declined Recommended yearly ophthalmology/optometry visit for glaucoma screening and checkup Recommended yearly dental visit for hygiene and checkup  Vaccinations:   Tdap vaccine: Education provided Shingles vaccine: Education provided    Advanced directives: Education provided  Conditions/risks identified:   Next appointment: 03-08-2022 @ 8:00 [facility] 40-64 Years and Older, Female Preventive care refers to lifestyle choices and visits with your health care provider that can promote health and wellness. What does preventive care include? A yearly physical exam. This is also called an annual well check. Dental exams once or twice a year. Routine eye exams. Ask your health care provider how often you should have your eyes checked. Personal lifestyle choices, including: Daily care of your teeth and gums. Regular physical activity. Eating a healthy diet. Avoiding tobacco and drug use. Limiting alcohol use. Practicing safe sex. Taking low-dose aspirin every day. Taking vitamin and mineral supplements as recommended by your health care provider. What happens during an annual well check? The services and screenings done by your health care provider during your annual well check will depend on your age, overall health, lifestyle risk factors, and family history of disease. Counseling  Your health care provider may ask you questions about your: Alcohol use. Tobacco use. Drug use. Emotional well-being. Home and relationship well-being. Sexual activity. Eating habits. History of falls. Memory and ability to  understand (cognition). Work and work Statistician. Reproductive health. Screening  You may have the following tests or measurements: Height, weight, and BMI. Blood pressure. Lipid and cholesterol levels. These may be checked every 5 years, or more frequently if you are over 33 years old. Skin check. Lung cancer screening. You may have this screening every year starting at age 90 if you have a 30-pack-year history of smoking and currently smoke or have quit within the past 15 years. Fecal occult blood test (FOBT) of the stool. You may have this test every year starting at age 53. Flexible sigmoidoscopy or colonoscopy. You may have a sigmoidoscopy every 5 years or a colonoscopy every 10 years starting at age 88. Hepatitis C blood test. Hepatitis B blood test. Sexually transmitted disease (STD) testing. Diabetes screening. This is done by checking your blood sugar (glucose) after you have not eaten for a while (fasting). You may have this done every 1-3 years. Bone density scan. This is done to screen for osteoporosis. You may have this done starting at age 62. Mammogram. This may be done every 1-2 years. Talk to your health care provider about how often you should have regular mammograms. Talk with your health care provider about your test results, treatment options, and if necessary, the need for more tests. Vaccines  Your health care provider may recommend certain vaccines, such as: Influenza vaccine. This is recommended every year. Tetanus, diphtheria, and acellular pertussis (Tdap, Td) vaccine. You may need a Td booster every 10 years. Zoster vaccine. You may need this after age 32. Pneumococcal 13-valent conjugate (PCV13) vaccine. One dose is recommended after age 15. Pneumococcal polysaccharide (PPSV23) vaccine. One dose is recommended after age 56. Talk to your health care provider about which screenings and vaccines you need and how often you  need them. This information is not  intended to replace advice given to you by your health care provider. Make sure you discuss any questions you have with your health care provider. Document Released: 07/04/2015 Document Revised: 02/25/2016 Document Reviewed: 04/08/2015 Elsevier Interactive Patient Education  2017 [place] Prevention in the Home Falls can cause injuries. They can happen to people of all ages. There are many things you can do to make your home safe and to help prevent falls. What can I do on the outside of my home? Regularly fix the edges of walkways and driveways and fix any cracks. Remove anything that might make you trip as you walk through a door, such as a raised step or threshold. Trim any bushes or trees on the path to your home. Use bright outdoor lighting. Clear any walking paths of anything that might make someone trip, such as rocks or tools. Regularly check to see if handrails are loose or broken. Make sure that both sides of any steps have handrails. Any raised decks and porches should have guardrails on the edges. Have any leaves, snow, or ice cleared regularly. Use sand or salt on walking paths during winter. Clean up any spills in your garage right away. This includes oil or grease spills. What can I do in the bathroom? Use night lights. Install grab bars by the toilet and in the tub and shower. Do not use towel bars as grab bars. Use non-skid mats or decals in the tub or shower. If you need to sit down in the shower, use a plastic, non-slip stool. Keep the floor dry. Clean up any water that spills on the floor as soon as it happens. Remove soap buildup in the tub or shower regularly. Attach bath mats securely with double-sided non-slip rug tape. Do not have throw rugs and other things on the floor that can make you trip. What can I do in the bedroom? Use night lights. Make sure that you have a light by your bed that is easy to reach. Do not use any sheets or blankets that are  too big for your bed. They should not hang down onto the floor. Have a firm chair that has side arms. You can use this for support while you get dressed. Do not have throw rugs and other things on the floor that can make you trip. What can I do in the kitchen? Clean up any spills right away. Avoid walking on wet floors. Keep items that you use a lot in easy-to-reach places. If you need to reach something above you, use a strong step stool that has a grab bar. Keep electrical cords out of the way. Do not use floor polish or wax that makes floors slippery. If you must use wax, use non-skid floor wax. Do not have throw rugs and other things on the floor that can make you trip. What can I do with my stairs? Do not leave any items on the stairs. Make sure that there are handrails on both sides of the stairs and use them. Fix handrails that are broken or loose. Make sure that handrails are as long as the stairways. Check any carpeting to make sure that it is firmly attached to the stairs. Fix any carpet that is loose or worn. Avoid having throw rugs at the top or bottom of the stairs. If you do have throw rugs, attach them to the floor with carpet tape. Make sure that you have a light switch  at the top of the stairs and the bottom of the stairs. If you do not have them, ask someone to add them for you. What else can I do to help prevent falls? Wear shoes that: Do not have high heels. Have rubber bottoms. Are comfortable and fit you well. Are closed at the toe. Do not wear sandals. If you use a stepladder: Make sure that it is fully opened. Do not climb a closed stepladder. Make sure that both sides of the stepladder are locked into place. Ask someone to hold it for you, if possible. Clearly mark and make sure that you can see: Any grab bars or handrails. First and last steps. Where the edge of each step is. Use tools that help you move around (mobility aids) if they are needed. These  include: Canes. Walkers. Scooters. Crutches. Turn on the lights when you go into a dark area. Replace any light bulbs as soon as they burn out. Set up your furniture so you have a clear path. Avoid moving your furniture around. If any of your floors are uneven, fix them. If there are any pets around you, be aware of where they are. Review your medicines with your doctor. Some medicines can make you feel dizzy. This can increase your chance of falling. Ask your doctor what other things that you can do to help prevent falls. This information is not intended to replace advice given to you by your health care provider. Make sure you discuss any questions you have with your health care provider. Document Released: 04/03/2009 Document Revised: 11/13/2015 Document Reviewed: 07/12/2014 Elsevier Interactive Patient Education  2017 [employer].

## 2021-12-03 ENCOUNTER — Other Ambulatory Visit: Payer: Self-pay

## 2021-12-03 DIAGNOSIS — Z1211 Encounter for screening for malignant neoplasm of colon: Secondary | ICD-10-CM

## 2021-12-05 ENCOUNTER — Other Ambulatory Visit: Payer: Self-pay | Admitting: Nurse Practitioner

## 2021-12-07 NOTE — Telephone Encounter (Signed)
Requested Prescriptions  Pending Prescriptions Disp Refills  . glucose blood (ONETOUCH ULTRA) test strip [Pharmacy Med Name: ONE TOUCH ULTRA BLUE TESTST(NEW)100] 100 strip 12    Sig: USE TO CHECK BLOOD SUGAR ONCE DAILY     Endocrinology: Diabetes - Testing Supplies Passed - 12/05/2021  3:14 AM      Passed - Valid encounter within last 12 months    Recent Outpatient Visits          3 months ago Aortic atherosclerosis (HCC)   [facility] Larae Grooms, NP   9 months ago Seasonal affective disorder (HCC)   [facility] Larae Grooms, NP   11 months ago Seasonal affective disorder (HCC)   [facility] Larae Grooms, NP   1 year ago Pain of toe of right foot   [facility] McElwee, Lauren A, NP   1 year ago Seasonal affective disorder (HCC)   [facility] Larae Grooms, NP      Future Appointments            In 3 months Larae Grooms, NP [facility]

## 2021-12-15 DIAGNOSIS — Z1211 Encounter for screening for malignant neoplasm of colon: Secondary | ICD-10-CM | POA: Diagnosis not present

## 2021-12-22 LAB — COLOGUARD: COLOGUARD: NEGATIVE

## 2021-12-23 NOTE — Progress Notes (Signed)
Hi Dawsyn.  Your Cologuard is negative. We will repeat it in 3 years.

## 2022-01-22 ENCOUNTER — Encounter: Payer: Self-pay | Admitting: Physician Assistant

## 2022-01-22 ENCOUNTER — Ambulatory Visit: Payer: Self-pay

## 2022-01-22 ENCOUNTER — Telehealth (INDEPENDENT_AMBULATORY_CARE_PROVIDER_SITE_OTHER): Payer: Medicare HMO | Admitting: Physician Assistant

## 2022-01-22 VITALS — BP 126/68 | HR 61 | Temp 96.4°F

## 2022-01-22 DIAGNOSIS — U071 COVID-19: Secondary | ICD-10-CM

## 2022-01-22 MED ORDER — ALBUTEROL SULFATE HFA 108 (90 BASE) MCG/ACT IN AERS: 2.0000 | INHALATION_SPRAY | Freq: Four times a day (QID) | RESPIRATORY_TRACT | 0 refills | Status: AC | PRN

## 2022-01-22 MED ORDER — MOLNUPIRAVIR EUA 200MG CAPSULE: 4.0000 | ORAL_CAPSULE | Freq: Two times a day (BID) | ORAL | 0 refills | Status: AC

## 2022-01-22 NOTE — Telephone Encounter (Signed)
Reason for Disposition  [1] HIGH RISK patient (e.g., weak immune system, age > 64 years, obesity with BMI 30 or higher, pregnant, chronic lung disease or other chronic medical condition) AND [2] COVID symptoms (e.g., cough, fever)  (Exceptions: Already seen by PCP and no new or worsening symptoms.)  Answer Assessment - Initial Assessment Questions 1. COVID-19 DIAGNOSIS: "How do you know that you have COVID?" (e.g., positive lab test or self-test, diagnosed by doctor or NP/PA, symptoms after exposure).     Tested positive for Covid yesterday.     I got it from my husband. 2. COVID-19 EXPOSURE: "Was there any known exposure to COVID before the symptoms began?" CDC Definition of close contact: within 6 feet (2 meters) for a total of 15 minutes or more over a 24-hour period.      Husband 3. ONSET: "When did the COVID-19 symptoms start?"      Yesterday body aches and sore throat and headache. 4. WORST SYMPTOM: "What is your worst symptom?" (e.g., cough, fever, shortness of breath, muscle aches)     Not asked 5. COUGH: "Do you have a cough?" If Yes, ask: "How bad is the cough?"       No 6. FEVER: "Do you have a fever?" If Yes, ask: "What is your temperature, how was it measured, and when did it start?"     Low grade I'm guessing   I'm having chills and then hot. 7. RESPIRATORY STATUS: "Describe your breathing?" (e.g., normal; shortness of breath, wheezing, unable to speak)      No 8. BETTER-SAME-WORSE: "Are you getting better, staying the same or getting worse compared to yesterday?"  If getting worse, ask, "In what way?"     Not asked 9. OTHER SYMPTOMS: "Do you have any other symptoms?"  (e.g., chills, fatigue, headache, loss of smell or taste, muscle pain, sore throat)     Headache. I've had asthma and pneumonia in the past.   I don't have a rescue inhaler which I may need. 10. HIGH RISK DISEASE: "Do you have any chronic medical problems?" (e.g., asthma, heart or lung disease, weak immune system,  obesity, etc.)       Past history of asthma and pneumonia.   Been several years since trouble with asthma. Sleep apnea with oral device 11. VACCINE: "Have you had the COVID-19 vaccine?" If Yes, ask: "Which one, how many shots, when did you get it?"       Not asked 12. PREGNANCY: "Is there any chance you are pregnant?" "When was your last menstrual period?"       N/A 13. O2 SATURATION MONITOR:  "Do you use an oxygen saturation monitor (pulse oximeter) at home?" If Yes, ask "What is your reading (oxygen level) today?" "What is your usual oxygen saturation reading?" (e.g., 95%)       N/A  Protocols used: Coronavirus (COVID-19) Diagnosed or Suspected-A-AH  Chief Complaint: Covid positive Symptoms: Headache, sore throat, body aches, fatigue Frequency: Symptoms started yesterday.   Tested positive yesterday Pertinent Negatives: Patient denies shortness of breath or chest tightness Disposition: [] ED /[] Urgent Care (no appt availability in office) / [x] Appointment(In office/virtual)/ []  [facility]/ [] Home Care/ [] Refused Recommended Disposition /[] [facility]/ []  Follow-up with PCP Additional Notes: Appt made for today with , PA for 11:20.

## 2022-01-22 NOTE — Progress Notes (Signed)
Virtual Visit via Video Note  I connected with Veronica Mills on 01/22/22 at 11:20 AM EDT by a video enabled telemedicine application and verified that I am speaking with the correct person using two identifiers.  Today's Provider: Jacquelin Hawking, MHS, PA-C Introduced myself to the patient as a PA-C and provided education on APPs in clinical practice.     Location: Patient: at home in [place], [place]  Provider: [facility], [place], [place]    I discussed the limitations of evaluation and management by telemedicine and the availability of in person appointments. The patient expressed understanding and agreed to proceed.    History of Present Illness:    Started having symptoms Wed - headaches which progressed to body aches and sore throat Denies fevers, SOB, chest tightness, wheezing, leg swelling  Reports nonproductive cough that started today Interventions: Tylenol but there was limited relief. Ibuprofen has helped with body aches She is nervous about her hx of asthma and having COVID She has had 4 COVID vaccines which includes a Bivalent booster   Review of Systems  Constitutional:  Positive for malaise/fatigue. Negative for fever.  HENT:  Positive for congestion and sore throat. Negative for ear pain and sinus pain.   Respiratory:  Positive for cough. Negative for shortness of breath and wheezing.   Cardiovascular:  Negative for chest pain, palpitations and leg swelling.  Gastrointestinal:  Negative for diarrhea, nausea and vomiting.  Musculoskeletal:  Positive for myalgias.  Neurological:  Positive for headaches. Negative for dizziness.      Observations/Objective:   Due to the nature of the virtual visit, physical exam and observations are limited. Able to obtain the following observations:   Alert, oriented, Appears comfortable, in no acute distress.  No scleral injection, no appreciated hoarseness, tachypnea, wheeze or strider. Able to maintain  conversation without visible strain.  No cough appreciated during visit.    Assessment and Plan:  Problem List Items Addressed This Visit   None Visit Diagnoses     COVID-19    -  Primary Acute, new problem Reports symptoms started Wed and have progressed to body aches, headaches, sore throat and congestion Pt would like to start an antiviral to prevent serious illness. Reviewed medication interactions with Paxlovid and Molnupiravir and we agreed on Molnupiravir  Recommend she use OTC multsymptom medications as well to assist with symptomatic relief Will provide a rescue inhaler as well as patient reports a hx of asthma and is concerned for a flare with current COVID infection Reviewed return and ED precautions Follow up as needed    Relevant Medications   molnupiravir EUA (LAGEVRIO) 200 mg CAPS capsule   albuterol (VENTOLIN HFA) 108 (90 Base) MCG/ACT inhaler      Follow Up Instructions:    I discussed the assessment and treatment plan with the patient. The patient was provided an opportunity to ask questions and all were answered. The patient agreed with the plan and demonstrated an understanding of the instructions.   The patient was advised to call back or seek an in-person evaluation if the symptoms worsen or if the condition fails to improve as anticipated.  I provided 16 minutes of non-face-to-face time during this encounter.  No follow-ups on file.   I, Dain Laseter E Forrestine Lecrone, PA-C, have reviewed all documentation for this visit. The documentation on 01/22/22 for the exam, diagnosis, procedures, and orders are all accurate and complete.   Jacquelin Hawking, MHS, PA-C [facility]

## 2022-01-22 NOTE — Telephone Encounter (Signed)
Pt is calling to report that she tested positive for COVID yesterday. Sx include massive headache that will not go away and a sore throat. Please advise  Left message to call back about symptoms.

## 2022-02-14 IMAGING — US US EXTREM LOW*L* LIMITED
1 series · 14 of 15 positions shown · non-contrast
Comparison: None.

CLINICAL DATA: Chronic heel mass.

EXAM:
ULTRASOUND left LOWER EXTREMITY LIMITED
TECHNIQUE: Ultrasound examination of the lower extremity soft tissues was
performed in the area of clinical concern.

[Series 1: us extrem low*left* limited · 0.04mm/px · 15 acquisitions, 14 frames shown]
[im 1/15]
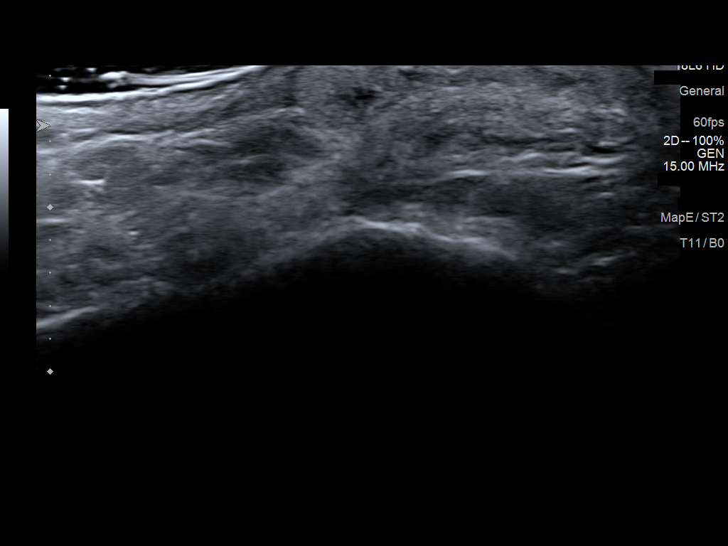
[im 2/15]
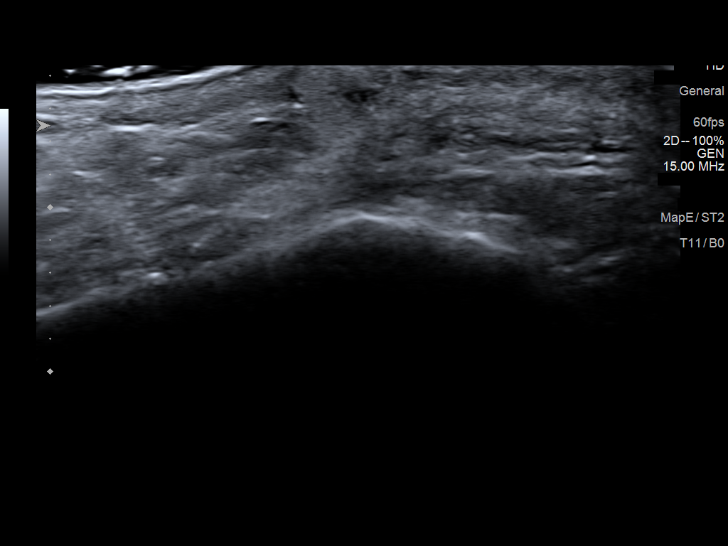
[im 3/15]
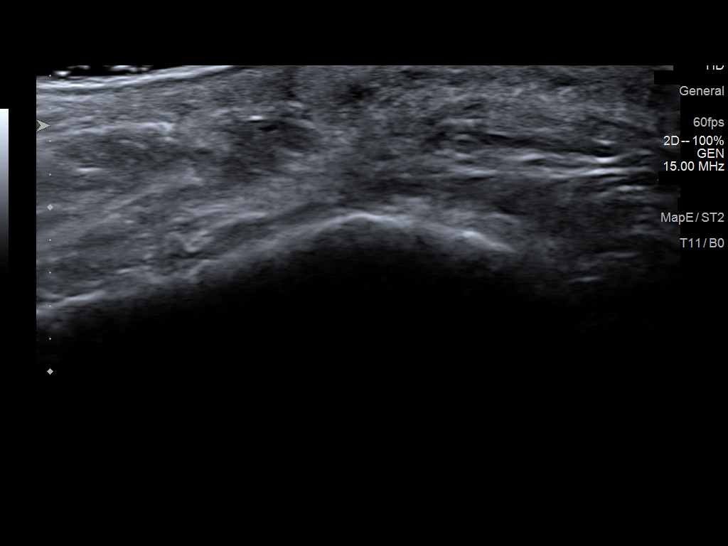
[im 4/15]
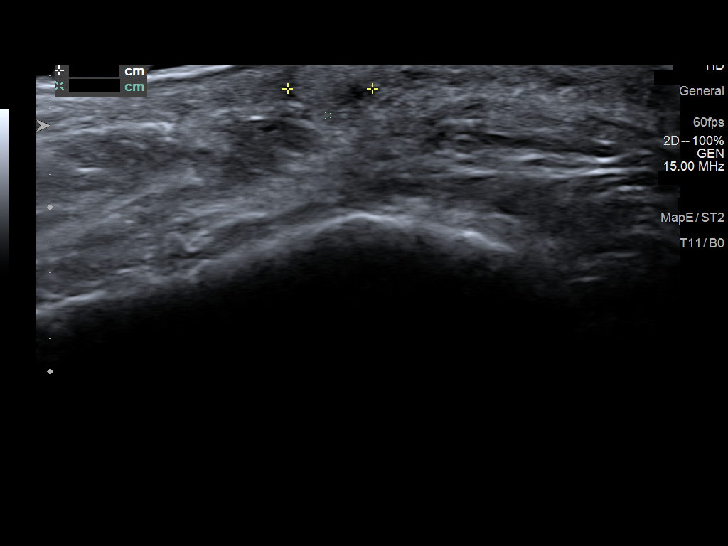
[im 5/15]
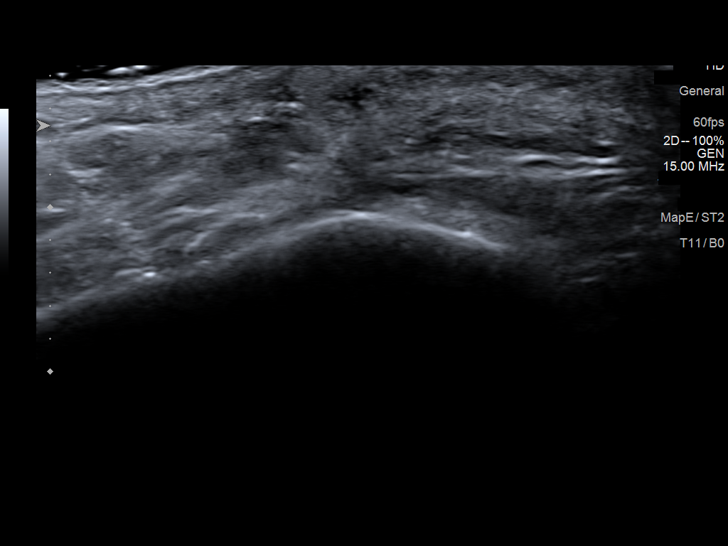
[im 6/15]
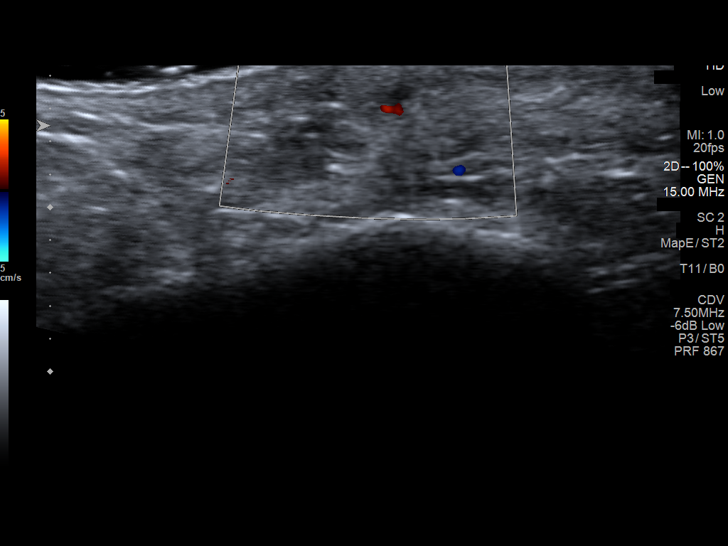
[im 7/15]
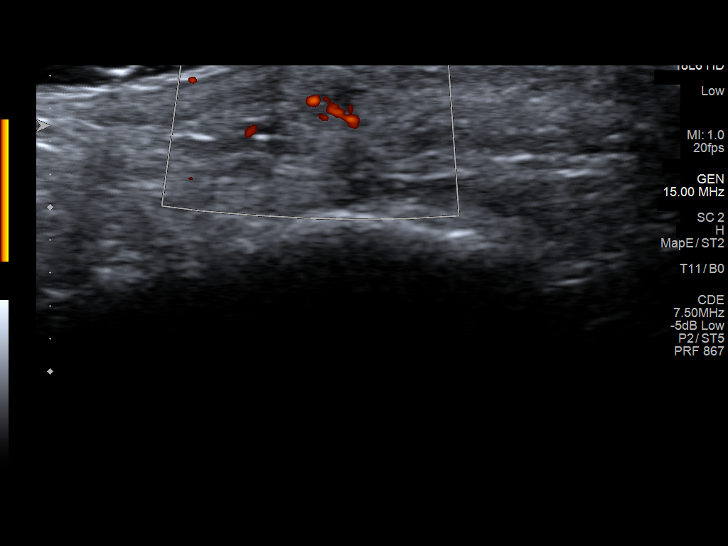
[im 9/15]
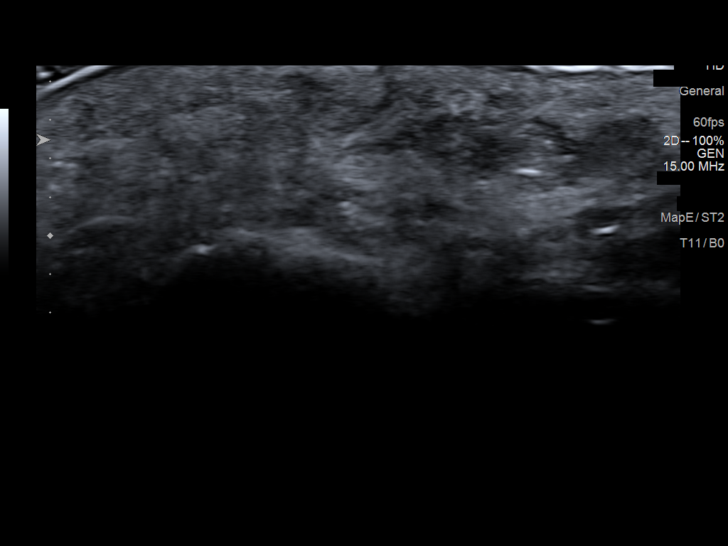
[im 10/15]
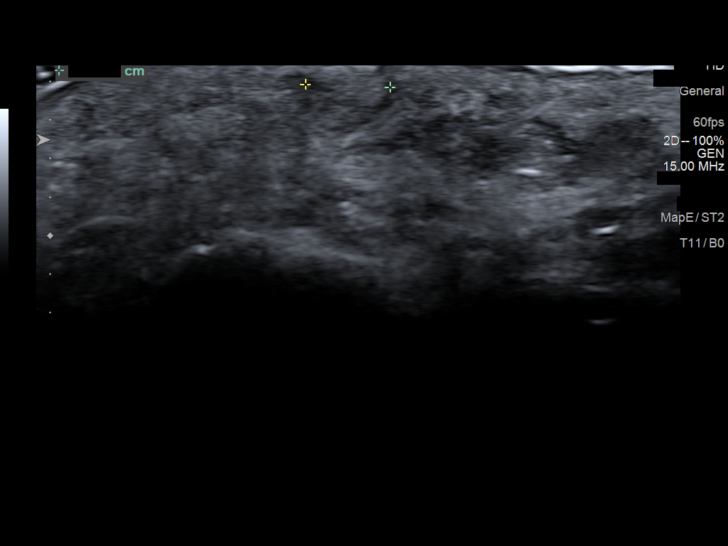
[im 11/15]
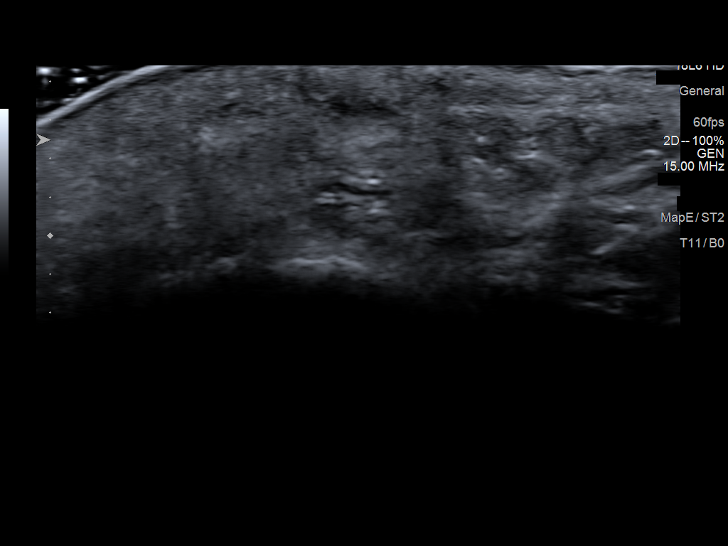
[im 12/15]
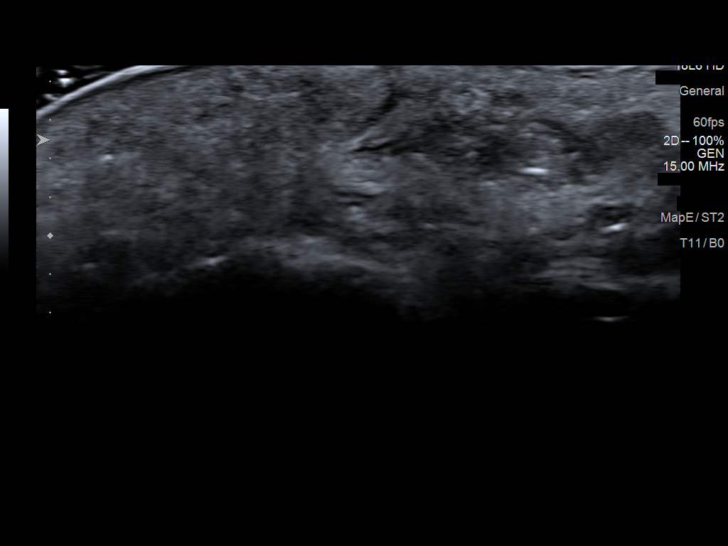
[im 13/15]
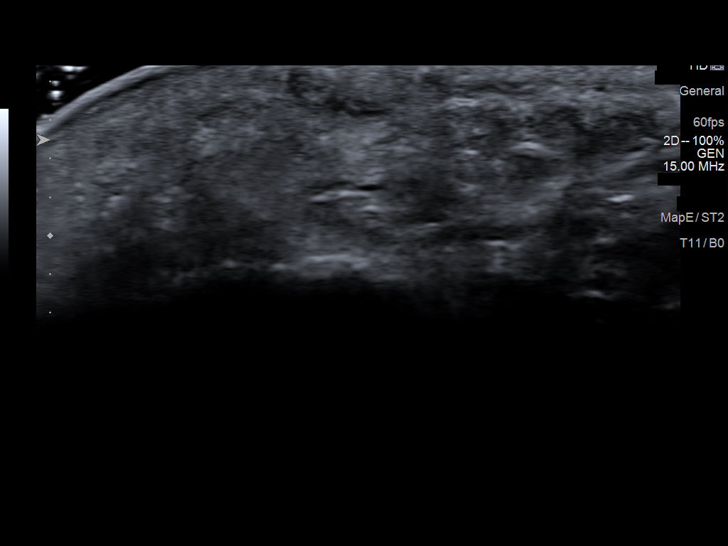
[im 14/15]
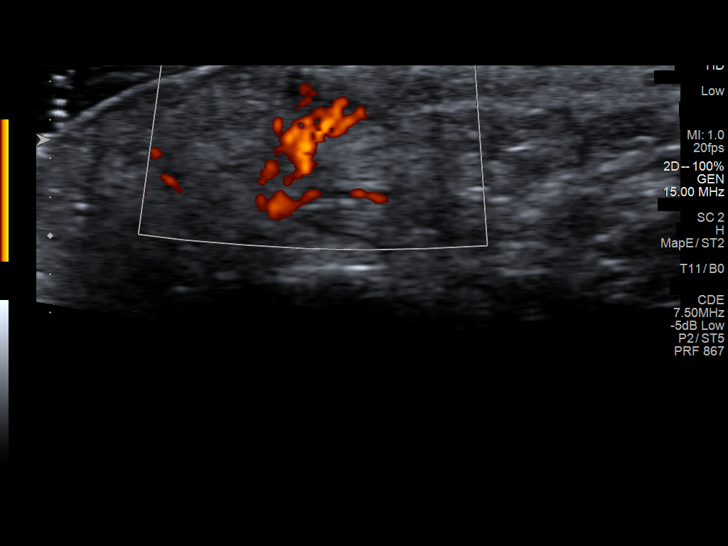
[im 15/15]
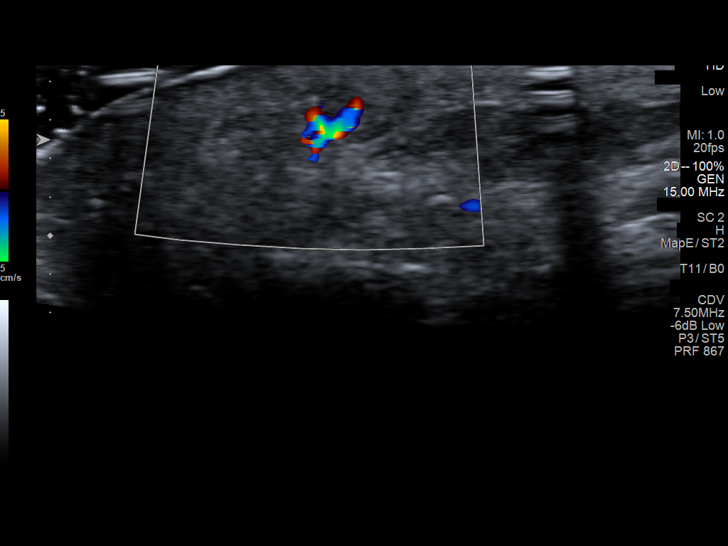

[14 of 15 positions shown; findings below may reference images not displayed]

FINDINGS: The patient's palpable abnormality corresponds to a small, 5 x 4 x 4
mm solid-appearing lesion in the heel pad medially. Minimal internal
blood flow. No specific imaging features are identified. It appears
to be too superficial for a plantar fibroma. MR imaging without and
with contrast may be helpful for further evaluation.
IMPRESSION: 5 x 4 x 4 mm solid-appearing nodule correspond to the patient's
palpable abnormality. No specific sonographic imaging features.
Recommend clinical surveillance or MRI for further evaluation.

## 2022-03-05 NOTE — Progress Notes (Unsigned)
There were no vitals taken for this visit.   Subjective:    Patient ID: Veronica Mills, female    DOB: December 09, 1955, 66 y.o.   MRN: [id number]  HPI: Veronica Mills is a 66 y.o. female  No chief complaint on file.  DEPRESSION/ANXIETY Patient states that things are going okay.  Patient states she is taking Contrave.  She is working to increase the medication slowly.  She is currently taking 1 tab in the morning and 1 in the evening and tolerating it well.    Flowsheet Row Clinical Support from 11/30/2021 in [facility]  PHQ-9 Total Score 8         09/03/2021    8:49 AM 03/05/2021   12:12 PM 01/02/2021    9:33 AM 11/23/2019   11:07 AM  GAD 7 : Generalized Anxiety Score  Nervous, Anxious, on Edge 0 1 1 1   Control/stop worrying 1 0 0 0  Worry too much - different things 0 0 0 0  Trouble relaxing 1 1 1 1   Restless 0 0 0 0  Easily annoyed or irritable 0 1 1 1   Afraid - awful might happen 0 0 0 0  Total GAD 7 Score 2 3 3 3   Anxiety Difficulty Not difficult at all Somewhat difficult Not difficult at all Not difficult at all   HYPOTHYROIDISM Thyroid control status:controlled Satisfied with current treatment? yes Medication side effects: no Medication compliance: excellent compliance Etiology of hypothyroidism:  Recent dose adjustment:no Fatigue: yes Cold intolerance: no Heat intolerance: yes Weight gain: yes Weight loss: no Constipation: no Diarrhea/loose stools: no Palpitations: no Lower extremity edema: no Anxiety/depressed mood: yes  SLEEP APNEA Sleep apnea status: controlled Duration: chronic Satisfied with current treatment?:  yes CPAP use:   uses an oral dental advice Sleep quality with CPAP use: good", average Treament compliance:good compliance Last sleep study:  Treatments attempted:  Wakes feeling refreshed:  no Daytime hypersomnolence:  no Fatigue:  yes Insomnia:  yes Good sleep hygiene:  yes Difficulty falling asleep:  yes Difficulty staying  asleep:  yes Snoring bothers bed partner:  yes Observed apnea by bed partner: no Obesity:  no Hypertension: no  Pulmonary hypertension:  no Coronary artery disease:  no   Relevant past medical, surgical, family and social history reviewed and updated as indicated. Interim medical history since our last visit reviewed. Allergies and medications reviewed and updated.  Review of Systems  Constitutional:  Negative for fever and unexpected weight change.  Eyes:  Negative for visual disturbance.  Respiratory:  Negative for cough, chest tightness and shortness of breath.   Cardiovascular:  Negative for chest pain, palpitations and leg swelling.  Endocrine: Negative for cold intolerance.  Skin:        Discolored toenails  Neurological:  Negative for dizziness and headaches.  Psychiatric/Behavioral:  Positive for dysphoric mood and sleep disturbance. Negative for suicidal ideas. The patient is nervous/anxious.     Per HPI unless specifically indicated above     Objective:    There were no vitals taken for this visit.  Wt Readings from Last 3 Encounters:  09/03/21 177 lb 9.6 oz (80.6 kg)  03/05/21 171 lb 6.4 oz (77.7 kg)  11/28/20 165 lb (74.8 kg)    Physical Exam Vitals and nursing note reviewed.  Constitutional:      General: She is not in acute distress.    Appearance: Normal appearance. She is normal weight. She is not ill-appearing, toxic-appearing or diaphoretic.  HENT:  Head: Normocephalic.     Right Ear: External ear normal.     Left Ear: External ear normal.     Nose: Nose normal.     Mouth/Throat:     Mouth: Mucous membranes are moist.     Pharynx: Oropharynx is clear.  Eyes:     General:        Right eye: No discharge.        Left eye: No discharge.     Extraocular Movements: Extraocular movements intact.     Conjunctiva/sclera: Conjunctivae normal.     Pupils: Pupils are equal, round, and reactive to light.  Cardiovascular:     Rate and Rhythm: Normal rate  and regular rhythm.     Heart sounds: No murmur heard. Pulmonary:     Effort: Pulmonary effort is normal. No respiratory distress.     Breath sounds: Normal breath sounds. No wheezing or rales.  Musculoskeletal:     Cervical back: Normal range of motion and neck supple.  Skin:    General: Skin is warm and dry.     Capillary Refill: Capillary refill takes less than 2 seconds.  Neurological:     General: No focal deficit present.     Mental Status: She is alert and oriented to person, place, and time. Mental status is at baseline.  Psychiatric:        Mood and Affect: Mood normal.        Behavior: Behavior normal.        Thought Content: Thought content normal.        Judgment: Judgment normal.     Results for orders placed or performed in visit on 12/03/21  Cologuard  Result Value Ref Range   COLOGUARD Negative Negative      Assessment & Plan:   Problem List Items Addressed This Visit       Cardiovascular and Mediastinum   Aortic atherosclerosis (HCC) - Primary     Respiratory   OSA (obstructive sleep apnea)     Endocrine   Hypothyroid   IFG (impaired fasting glucose)     Other   Hyperlipemia     Follow up plan: No follow-ups on file.    A total of 20 minutes were spent on this encounter today.  When total time is documented, this includes both the face-to-face and non-face-to-face time personally spent before, during and after the visit on the date of the encounter on depression.

## 2022-03-08 ENCOUNTER — Encounter: Payer: Self-pay | Admitting: Nurse Practitioner

## 2022-03-08 ENCOUNTER — Ambulatory Visit (INDEPENDENT_AMBULATORY_CARE_PROVIDER_SITE_OTHER): Payer: Medicare HMO | Admitting: Nurse Practitioner

## 2022-03-08 VITALS — BP 122/66 | HR 65 | Temp 97.9°F | Ht 65.35 in | Wt 151.8 lb

## 2022-03-08 DIAGNOSIS — Z0000 Encounter for general adult medical examination without abnormal findings: Secondary | ICD-10-CM

## 2022-03-08 DIAGNOSIS — G4733 Obstructive sleep apnea (adult) (pediatric): Secondary | ICD-10-CM | POA: Diagnosis not present

## 2022-03-08 DIAGNOSIS — R5383 Other fatigue: Secondary | ICD-10-CM | POA: Diagnosis not present

## 2022-03-08 DIAGNOSIS — E785 Hyperlipidemia, unspecified: Secondary | ICD-10-CM

## 2022-03-08 DIAGNOSIS — E039 Hypothyroidism, unspecified: Secondary | ICD-10-CM

## 2022-03-08 DIAGNOSIS — R634 Abnormal weight loss: Secondary | ICD-10-CM

## 2022-03-08 DIAGNOSIS — R7301 Impaired fasting glucose: Secondary | ICD-10-CM

## 2022-03-08 DIAGNOSIS — I700 Atherosclerosis of aorta: Secondary | ICD-10-CM | POA: Diagnosis not present

## 2022-03-08 DIAGNOSIS — R8290 Unspecified abnormal findings in urine: Secondary | ICD-10-CM

## 2022-03-08 LAB — MICROSCOPIC EXAMINATION: Bacteria, UA: NONE SEEN

## 2022-03-08 LAB — URINALYSIS, ROUTINE W REFLEX MICROSCOPIC
Bilirubin, UA: NEGATIVE
Glucose, UA: NEGATIVE
Ketones, UA: NEGATIVE
Nitrite, UA: NEGATIVE
Protein,UA: NEGATIVE
RBC, UA: NEGATIVE
Specific Gravity, UA: 1.02 (ref 1.005–1.030)
Urobilinogen, Ur: 0.2 mg/dL (ref 0.2–1.0)
pH, UA: 6.5 (ref 5.0–7.5)

## 2022-03-08 MED ORDER — CYCLOBENZAPRINE HCL 5 MG PO TABS: 5.0000 mg | ORAL_TABLET | Freq: Every day | ORAL | 1 refills | Status: AC

## 2022-03-08 NOTE — Assessment & Plan Note (Signed)
Chronic.  Controlled.  Continue with current medication regimen of Amour Thyroid 30mg  daily.  Labs ordered today.  Return to clinic in 6 months for reevaluation.  Call sooner if concerns arise.

## 2022-03-08 NOTE — Progress Notes (Signed)
BP 122/66   Pulse 65   Temp 97.9 F (36.6 C) (Oral)   Ht 5' 5.35" (1.66 m)   Wt 151 lb 12.8 oz (68.9 kg)   SpO2 97%   BMI 24.99 kg/m    Subjective:    Patient ID: Veronica Mills, female    DOB: 10-23-1955, 66 y.o.   MRN: [id number]  HPI: Veronica Mills is a 66 y.o. female presenting on 03/08/2022 for comprehensive medical examination. Current medical complaints include: weight loss program.  She currently lives with: Menopausal Symptoms: no  Patient states she has hurt her back on Saturday.  She took some flexeril but not sure if it took the pain away.  Hurts with movement, rolling over in bed, standing, sitting.    Patient states she has been following a weight loss program and has lost about 30lbs.  Feeling well.  Would like labs drawn to see how her body is reacting.   DEPRESSION/ANXIETY Patient states that things are going okay.  She is currently taking the Wellbutrin which she feels like is helping her symptoms.  Denies SI.   [place] [facility] Visit from 03/08/2022 in [place]  PHQ-9 Total Score 5         03/08/2022    8:06 AM 09/03/2021    8:49 AM 03/05/2021   12:12 PM 01/02/2021    9:33 AM  GAD 7 : Generalized Anxiety Score  Nervous, Anxious, on Edge 0 0 1 1  Control/stop worrying 0 1 0 0  Worry too much - different things 0 0 0 0  Trouble relaxing 0 1 1 1   Restless 0 0 0 0  Easily annoyed or irritable 0 0 1 1  Afraid - awful might happen 0 0 0 0  Total GAD 7 Score 0 2 3 3   Anxiety Difficulty Not difficult at all Not difficult at all Somewhat difficult Not difficult at all   HYPOTHYROIDISM Thyroid control status:controlled Satisfied with current treatment? yes Medication side effects: no Medication compliance: excellent compliance Etiology of hypothyroidism:  Recent dose adjustment:no Fatigue: yes - she has been experiencing fatigue since end of august when she had COVID Cold intolerance: no Heat intolerance: yes Weight gain: no Weight  loss: no Constipation: no Diarrhea/loose stools: no Palpitations: no Lower extremity edema: no Anxiety/depressed mood: yes  SLEEP APNEA Sleep apnea status: controlled Duration: chronic Satisfied with current treatment?:  yes CPAP use:  uses an oral dental advice Sleep quality with CPAP use: good", average Treament compliance:good compliance Last sleep study:  Treatments attempted:  Wakes feeling refreshed:  no Daytime hypersomnolence:  no Fatigue:  yes Insomnia:  yes Good sleep hygiene:  yes Difficulty falling asleep:  yes Difficulty staying asleep:  yes Snoring bothers bed partner:  yes Observed apnea by bed partner: no Obesity:  no Hypertension: no  Pulmonary hypertension:  no Coronary artery disease:  no   Depression Screen done today and results listed below:     03/08/2022    8:06 AM 11/30/2021   10:38 AM 09/03/2021    8:48 AM 03/05/2021   12:11 PM 01/02/2021    9:32 AM  Depression screen PHQ 2/9  Decreased Interest 1 2 1 1 2   Down, Depressed, Hopeless 0 2 1 1 1   PHQ - 2 Score 1 4 2 2 3   Altered sleeping 2 2 2 3 2   Tired, decreased energy 2 1 2 3 3   Change in appetite 0 1 1 3 3   Feeling bad or failure  about yourself  0 0 1 0 0  Trouble concentrating 0 0 1 1 1   Moving slowly or fidgety/restless 0 0 0 0 0  Suicidal thoughts 0 0 0 0 0  PHQ-9 Score 5 8 9 12 12   Difficult doing work/chores Not difficult at all Somewhat difficult Not difficult at all Somewhat difficult Somewhat difficult    The patient does not have a history of falls. I did complete a risk assessment for falls. A plan of care for falls was documented.   Past Medical History:  Past Medical History:  Diagnosis Date   Allergy    Anxiety    Bursitis of both hips    Depression    Insomnia    Lyme disease    Sleep apnea    Sleep apnea    Thyroid disease     Surgical History:  Past Surgical History:  Procedure Laterality Date   SHOULDER SURGERY Left 05/2009   TOTAL ABDOMINAL HYSTERECTOMY   06/21/1997    Medications:  Current Outpatient Medications on File Prior to Visit  Medication Sig   albuterol (VENTOLIN HFA) 108 (90 Base) MCG/ACT inhaler Inhale 2 puffs into the lungs every 6 (six) hours as needed for wheezing or shortness of breath.   ARMOUR THYROID 30 MG tablet TAKE 1 TABLET BY MOUTH DAILY BEFORE BREAKFAST   buPROPion (WELLBUTRIN XL) 150 MG 24 hr tablet Take 150 mg by mouth daily.   glucose blood (ONETOUCH ULTRA) test strip USE TO CHECK BLOOD SUGAR ONCE DAILY   OneTouch Delica Lancets 92H MISC    No current facility-administered medications on file prior to visit.    Allergies:  Allergies  Allergen Reactions   Plaquenil [Hydroxychloroquine Sulfate] Itching   Rocephin [Ceftriaxone Sodium In Dextrose] Hives   Pneumococcal Vaccines Other (See Comments)   Prednisone Other (See Comments)    Increased pain sxs   Erythromycin     UTI   Trazodone And Nefazodone     Makes patient hyper. Ineffective.    Social History:  Social History   Socioeconomic History   Marital status: Married    Spouse name: Not on file   Number of children: Not on file   Years of education: Not on file   Highest education level: Master's degree (e.g., MA, MS, MEng, MEd, MSW, MBA)  Occupational History   Occupation: disabled   Tobacco Use   Smoking status: Former    Packs/day: 0.00    Years: 0.00    Total pack years: 0.00    Types: Cigarettes    Quit date: 1984    Years since quitting: 39.7   Smokeless tobacco: Never  Vaping Use   Vaping Use: Never used  Substance and Sexual Activity   Alcohol use: Not Currently    Comment: 1 a month   Drug use: No   Sexual activity: Yes  Other Topics Concern   Not on file  Social History Narrative   Still new to the area   Social Determinants of Health   Financial Resource Strain: Low Risk  (11/30/2021)   Overall Financial Resource Strain (CARDIA)    Difficulty of Paying Living Expenses: Not hard at all  Food Insecurity: No Food  Insecurity (11/30/2021)   Hunger Vital Sign    Worried About Running Out of Food in the Last Year: Never true    [place] in the Last Year: Never true  Transportation Needs: No Transportation Needs (11/30/2021)   [place] - Transportation  Lack of Transportation (Medical): No    Lack of Transportation (Non-Medical): No  Physical Activity: Sufficiently Active (11/30/2021)   Exercise Vital Sign    Days of Exercise per Week: 4 days    Minutes of Exercise per Session: 40 min  Stress: No Stress Concern Present (11/30/2021)   [place]    Feeling of Stress : Only a little  Social Connections: Moderately Isolated (11/30/2021)   Social Connection and Isolation Panel [NHANES]    Frequency of Communication with Friends and Family: More than three times a week    Frequency of Social Gatherings with Friends and Family: Never    Attends Religious Services: Never    Marine scientist or Organizations: No    Attends Archivist Meetings: Never    Marital Status: Married  Human resources officer Violence: Not At Risk (11/30/2021)   Humiliation, Afraid, Rape, and Kick questionnaire    Fear of Current or Ex-Partner: No    Emotionally Abused: No    Physically Abused: No    Sexually Abused: No   Social History   Tobacco Use  Smoking Status Former   Packs/day: 0.00   Years: 0.00   Total pack years: 0.00   Types: Cigarettes   Quit date: 1984   Years since quitting: 39.7  Smokeless Tobacco Never   Social History   Substance and Sexual Activity  Alcohol Use Not Currently   Comment: 1 a month    Family History:  Family History  Problem Relation Age of Onset   Mental retardation Mother    Stroke Father    Dementia Father    Depression Sister    Depression Daughter    Diabetes Maternal Grandfather    Congestive Heart Failure Paternal Grandmother    Cancer Paternal Grandfather    Hypertension Sister     Obesity Sister    Depression Daughter     Past medical history, surgical history, medications, allergies, family history and social history reviewed with patient today and changes made to appropriate areas of the chart.   Review of Systems  Constitutional:  Positive for malaise/fatigue. Negative for weight loss.  Cardiovascular:  Negative for palpitations and leg swelling.  Gastrointestinal:  Negative for constipation and diarrhea.  Psychiatric/Behavioral:  Positive for depression. Negative for suicidal ideas. The patient is not nervous/anxious.    All other ROS negative except what is listed above and in the HPI.      Objective:    BP 122/66   Pulse 65   Temp 97.9 F (36.6 C) (Oral)   Ht 5' 5.35" (1.66 m)   Wt 151 lb 12.8 oz (68.9 kg)   SpO2 97%   BMI 24.99 kg/m   Wt Readings from Last 3 Encounters:  03/08/22 151 lb 12.8 oz (68.9 kg)  09/03/21 177 lb 9.6 oz (80.6 kg)  03/05/21 171 lb 6.4 oz (77.7 kg)    Physical Exam Vitals and nursing note reviewed.  Constitutional:      General: She is awake. She is not in acute distress.    Appearance: Normal appearance. She is well-developed and normal weight. She is not ill-appearing.  HENT:     Head: Normocephalic and atraumatic.     Right Ear: Hearing, tympanic membrane, ear canal and external ear normal. No drainage.     Left Ear: Hearing, tympanic membrane, ear canal and external ear normal. No drainage.     Nose: Nose normal.  Right Sinus: No maxillary sinus tenderness or frontal sinus tenderness.     Left Sinus: No maxillary sinus tenderness or frontal sinus tenderness.     Mouth/Throat:     Mouth: Mucous membranes are moist.     Pharynx: Oropharynx is clear. Uvula midline. No pharyngeal swelling, oropharyngeal exudate or posterior oropharyngeal erythema.  Eyes:     General: Lids are normal.        Right eye: No discharge.        Left eye: No discharge.     Extraocular Movements: Extraocular movements intact.      Conjunctiva/sclera: Conjunctivae normal.     Pupils: Pupils are equal, round, and reactive to light.     Visual Fields: Right eye visual fields normal and left eye visual fields normal.  Neck:     Thyroid: No thyromegaly.     Vascular: No carotid bruit.     Trachea: Trachea normal.  Cardiovascular:     Rate and Rhythm: Normal rate and regular rhythm.     Heart sounds: Normal heart sounds. No murmur heard.    No gallop.  Pulmonary:     Effort: Pulmonary effort is normal. No accessory muscle usage or respiratory distress.     Breath sounds: Normal breath sounds.  Chest:  Breasts:    Right: Normal.     Left: Normal.  Abdominal:     General: Bowel sounds are normal.     Palpations: Abdomen is soft. There is no hepatomegaly or splenomegaly.     Tenderness: There is no abdominal tenderness.  Musculoskeletal:        General: Normal range of motion.     Cervical back: Normal range of motion and neck supple.     Right lower leg: No edema.     Left lower leg: No edema.  Lymphadenopathy:     Head:     Right side of head: No submental, submandibular, tonsillar, preauricular or posterior auricular adenopathy.     Left side of head: No submental, submandibular, tonsillar, preauricular or posterior auricular adenopathy.     Cervical: No cervical adenopathy.     Upper Body:     Right upper body: No supraclavicular, axillary or pectoral adenopathy.     Left upper body: No supraclavicular, axillary or pectoral adenopathy.  Skin:    General: Skin is warm and dry.     Capillary Refill: Capillary refill takes less than 2 seconds.     Findings: No rash.  Neurological:     Mental Status: She is alert and oriented to person, place, and time.     Gait: Gait is intact.     Deep Tendon Reflexes: Reflexes are normal and symmetric.     Reflex Scores:      Brachioradialis reflexes are 2+ on the right side and 2+ on the left side.      Patellar reflexes are 2+ on the right side and 2+ on the left  side. Psychiatric:        Attention and Perception: Attention normal.        Mood and Affect: Mood normal.        Speech: Speech normal.        Behavior: Behavior normal. Behavior is cooperative.        Thought Content: Thought content normal.        Judgment: Judgment normal.     Results for orders placed or performed in visit on 12/03/21  Cologuard  Result Value Ref Range  COLOGUARD Negative Negative      Assessment & Plan:   Problem List Items Addressed This Visit       Cardiovascular and Mediastinum   Aortic atherosclerosis ([place])    Noted on CT scan 02/03/2020.  May benefit from statin therapy and Mills in future, monitor lipids.  Labs ordered today. Will make recommendations based on lab results.      Relevant Orders   Comp Met (CMET)     Respiratory   OSA (obstructive sleep apnea)    Chronic.  Controlled.  Continue with current medication regimen of Dental device.  Labs ordered today.  Return to clinic in 6 months for reevaluation.  Call sooner if concerns arise.        Relevant Orders   Comp Met (CMET)     Endocrine   Hypothyroid    Chronic.  Controlled.  Continue with current medication regimen of [place] Thyroid 38m daily.  Labs ordered today.  Return to clinic in 6 months for reevaluation.  Call sooner if concerns arise.        Relevant Orders   TSH   T3, free   T3, reverse   T4, free   Thyroid peroxidase antibody   IFG (impaired fasting glucose)    Labs ordered at visit today.  Will make recommendations based on lab results.        Relevant Orders   Comp Met (CMET)   HgB A1c     Other   Hyperlipemia    Labs ordered today. Will make recommendations based on lab results.       Relevant Orders   Comp Met (CMET)   Lipid Profile   Other Visit Diagnoses     Annual physical exam    -  Primary   Health maintenance reviewed during visit today.  Labs ordered.  Declined vaccines.  Declines. Mammogram.  Up to date on Cologuard.   Relevant Orders    Comp Met (CMET)   Lipid Profile   HgB A1c   TSH   Urinalysis, Routine w reflex microscopic   Weight loss       Has lost 30lbs. Has requested several labs to be drawn due to planned weight loss.     Relevant Orders   Comp Met (CMET)   Lipid Profile   HgB A1c   TSH   Urinalysis, Routine w reflex microscopic   Magnesium   Phosphorus   C-reactive protein   Cortisol   B12   Uric acid   Iron, TIBC and Ferritin Panel   Ferritin   Transferrin   HgB A1c   Insulin-like growth factor   Homocysteine   Hepatic function panel   Insulin, Free and Total   T3, free   T3, reverse   T4, free   Vitamin D (25 hydroxy)   CBC w/Diff        Follow up plan: No follow-ups on file.   LABORATORY TESTING:  - Pap smear: not applicable  IMMUNIZATIONS:   - Tdap: Tetanus vaccination status reviewed: Refused. - Influenza: Refused - Pneumovax: Not applicable - Prevnar: Not applicable - COVID: Not applicable - HPV: Not applicable - Shingrix vaccine:  Discussed at visit today  SCREENING: -Mammogram: Refused  - Colonoscopy: Up to date  - Bone Density: Not applicable  -Hearing Test: Not applicable  -Spirometry: Not applicable   PATIENT COUNSELING:   Advised to take 1 mg of folate supplement per day if capable of pregnancy.   Sexuality: Discussed sexually transmitted  diseases, partner selection, use of condoms, avoidance of unintended pregnancy  and contraceptive alternatives.   Advised to avoid cigarette smoking.  I discussed with the patient that most people either abstain from alcohol or drink within safe limits (<=14/week and <=4 drinks/occasion for males, <=7/weeks and <= 3 drinks/occasion for females) and that the risk for alcohol disorders and other health effects rises proportionally with the number of drinks per week and how often a drinker exceeds daily limits.  Discussed cessation/primary prevention of drug use and availability of treatment for abuse.   Diet: Encouraged to  adjust caloric intake to maintain  or achieve ideal body weight, to reduce intake of dietary saturated fat and total fat, to limit sodium intake by avoiding high sodium foods and not adding table salt, and to maintain adequate dietary potassium and calcium preferably from fresh fruits, vegetables, and low-fat dairy products.    stressed the importance of regular exercise  Injury prevention: Discussed safety belts, safety helmets, smoke detector, smoking near bedding or upholstery.   Dental health: Discussed importance of regular tooth brushing, flossing, and dental visits.    NEXT PREVENTATIVE PHYSICAL DUE IN 1 YEAR. No follow-ups on file.

## 2022-03-08 NOTE — Assessment & Plan Note (Signed)
Labs ordered today.  Will make recommendations based on lab results. ?

## 2022-03-08 NOTE — Addendum Note (Signed)
Addended by: Jon Billings on: 03/08/2022 09:59 AM   Modules accepted: Orders

## 2022-03-08 NOTE — Assessment & Plan Note (Signed)
Noted on CT scan 02/03/2020.  May benefit from statin therapy and ASA in future, monitor lipids.  Labs ordered today. Will make recommendations based on lab results.

## 2022-03-08 NOTE — Assessment & Plan Note (Signed)
Labs ordered at visit today.  Will make recommendations based on lab results.   

## 2022-03-08 NOTE — Assessment & Plan Note (Signed)
Chronic.  Controlled.  Continue with current medication regimen of Dental device.  Labs ordered today.  Return to clinic in 6 months for reevaluation.  Call sooner if concerns arise.

## 2022-03-10 LAB — URINE CULTURE

## 2022-03-14 LAB — HEPATIC FUNCTION PANEL: Bilirubin, Direct: 0.12 mg/dL (ref 0.00–0.40)

## 2022-03-14 LAB — LIPID PANEL
Chol/HDL Ratio: 3.2 ratio (ref 0.0–4.4)
Cholesterol, Total: 196 mg/dL (ref 100–199)
HDL: 61 mg/dL (ref >39)
LDL Chol Calc (NIH): 116 mg/dL — ABNORMAL HIGH (ref 0–99)
Triglycerides: 108 mg/dL (ref 0–149)
VLDL Cholesterol Cal: 19 mg/dL (ref 5–40)

## 2022-03-14 LAB — COMPREHENSIVE METABOLIC PANEL
ALT: 17 IU/L (ref 0–32)
AST: 17 IU/L (ref 0–40)
Albumin/Globulin Ratio: 1.9 (ref 1.2–2.2)
Albumin: 4.8 g/dL (ref 3.9–4.9)
Alkaline Phosphatase: 74 IU/L (ref 44–121)
BUN/Creatinine Ratio: 22 (ref 12–28)
BUN: 16 mg/dL (ref 8–27)
Bilirubin Total: 0.3 mg/dL (ref 0.0–1.2)
CO2: 25 mmol/L (ref 20–29)
Calcium: 10 mg/dL (ref 8.7–10.3)
Chloride: 99 mmol/L (ref 96–106)
Creatinine, Ser: 0.73 mg/dL (ref 0.57–1.00)
Globulin, Total: 2.5 g/dL (ref 1.5–4.5)
Glucose: 105 mg/dL — ABNORMAL HIGH (ref 70–99)
Potassium: 4 mmol/L (ref 3.5–5.2)
Sodium: 140 mmol/L (ref 134–144)
Total Protein: 7.3 g/dL (ref 6.0–8.5)
eGFR: 92 mL/min/{1.73_m2} (ref >59)

## 2022-03-14 LAB — CBC WITH DIFFERENTIAL/PLATELET
Basophils Absolute: 0 10*3/uL (ref 0.0–0.2)
Basos: 1 %
EOS (ABSOLUTE): 0.2 10*3/uL (ref 0.0–0.4)
Eos: 4 %
Hematocrit: 43.8 % (ref 34.0–46.6)
Hemoglobin: 14.4 g/dL (ref 11.1–15.9)
Immature Grans (Abs): 0 10*3/uL (ref 0.0–0.1)
Immature Granulocytes: 0 %
Lymphocytes Absolute: 1.9 10*3/uL (ref 0.7–3.1)
Lymphs: 30 %
MCH: 30.7 pg (ref 26.6–33.0)
MCHC: 32.9 g/dL (ref 31.5–35.7)
MCV: 93 fL (ref 79–97)
Monocytes Absolute: 0.4 10*3/uL (ref 0.1–0.9)
Monocytes: 7 %
Neutrophils Absolute: 3.8 10*3/uL (ref 1.4–7.0)
Neutrophils: 58 %
Platelets: 245 10*3/uL (ref 150–450)
RBC: 4.69 x10E6/uL (ref 3.77–5.28)
RDW: 12.1 % (ref 11.7–15.4)
WBC: 6.5 10*3/uL (ref 3.4–10.8)

## 2022-03-14 LAB — T3, REVERSE: Reverse T3, Serum: 12.5 ng/dL (ref 9.2–24.1)

## 2022-03-14 LAB — VITAMIN B12: Vitamin B-12: 768 pg/mL (ref 232–1245)

## 2022-03-14 LAB — VITAMIN D 25 HYDROXY (VIT D DEFICIENCY, FRACTURES): Vit D, 25-Hydroxy: 33.9 ng/mL (ref 30.0–100.0)

## 2022-03-14 LAB — MAGNESIUM: Magnesium: 2.1 mg/dL (ref 1.6–2.3)

## 2022-03-14 LAB — URIC ACID: Uric Acid: 2.8 mg/dL — ABNORMAL LOW (ref 3.0–7.2)

## 2022-03-14 LAB — IRON,TIBC AND FERRITIN PANEL
Ferritin: 367 ng/mL — ABNORMAL HIGH (ref 15–150)
Iron Saturation: 26 % (ref 15–55)
Iron: 94 ug/dL (ref 27–139)
Total Iron Binding Capacity: 359 ug/dL (ref 250–450)
UIBC: 265 ug/dL (ref 118–369)

## 2022-03-14 LAB — INSULIN-LIKE GROWTH FACTOR: Insulin-Like GF-1: 230 ng/mL — ABNORMAL HIGH (ref 57–202)

## 2022-03-14 LAB — HEMOGLOBIN A1C
Est. average glucose Bld gHb Est-mCnc: 120 mg/dL
Hgb A1c MFr Bld: 5.8 % — ABNORMAL HIGH (ref 4.8–5.6)

## 2022-03-14 LAB — HOMOCYSTEINE: Homocysteine: 11.8 umol/L (ref 0.0–17.2)

## 2022-03-14 LAB — THYROID PEROXIDASE ANTIBODY: Thyroperoxidase Ab SerPl-aCnc: 11 IU/mL (ref 0–34)

## 2022-03-14 LAB — T4, FREE: Free T4: 0.98 ng/dL (ref 0.82–1.77)

## 2022-03-14 LAB — INSULIN, FREE AND TOTAL
Free Insulin: 5.4 uU/mL
Total Insulin: 5.4 uU/mL

## 2022-03-14 LAB — TRANSFERRIN: Transferrin: 282 mg/dL (ref 192–364)

## 2022-03-14 LAB — T3, FREE: T3, Free: 3.9 pg/mL (ref 2.0–4.4)

## 2022-03-14 LAB — C-REACTIVE PROTEIN: CRP: 1 mg/L (ref 0–10)

## 2022-03-14 LAB — CORTISOL: Cortisol: 10.2 ug/dL (ref 6.2–19.4)

## 2022-03-14 LAB — PHOSPHORUS: Phosphorus: 3.6 mg/dL (ref 3.0–4.3)

## 2022-03-14 LAB — TSH: TSH: 1.52 u[IU]/mL (ref 0.450–4.500)

## 2022-03-15 NOTE — Progress Notes (Signed)
Hi Veronica Mills. Your lab work looks good.  Most of the tests you requested were unremarkable.  Your Insulin like growth factor was elevated.  Cholesterol and A1c are improved from prior.  Continue with your current medication regimen.  Follow up as discussed.

## 2022-03-15 NOTE — Progress Notes (Signed)
The insulin like growth factor is likely off because she is prediaebtic. I recommend a low carb diet.  Which she is already losing weight and likely headed in the right direction.  If she would like more information regarding this I can refer her to Endocrinology.

## 2022-03-25 DIAGNOSIS — H40053 Ocular hypertension, bilateral: Secondary | ICD-10-CM | POA: Diagnosis not present

## 2022-03-25 DIAGNOSIS — H43811 Vitreous degeneration, right eye: Secondary | ICD-10-CM | POA: Diagnosis not present

## 2022-03-25 DIAGNOSIS — E119 Type 2 diabetes mellitus without complications: Secondary | ICD-10-CM | POA: Diagnosis not present

## 2022-04-13 ENCOUNTER — Ambulatory Visit: Payer: Self-pay

## 2022-04-13 ENCOUNTER — Other Ambulatory Visit: Payer: Self-pay

## 2022-04-13 ENCOUNTER — Encounter: Payer: Self-pay | Admitting: Family Medicine

## 2022-04-13 ENCOUNTER — Telehealth: Payer: Self-pay | Admitting: Nurse Practitioner

## 2022-04-13 ENCOUNTER — Ambulatory Visit (INDEPENDENT_AMBULATORY_CARE_PROVIDER_SITE_OTHER): Payer: Medicare HMO | Admitting: Family Medicine

## 2022-04-13 VITALS — Ht 64.0 in | Wt 149.6 lb

## 2022-04-13 DIAGNOSIS — N3001 Acute cystitis with hematuria: Secondary | ICD-10-CM | POA: Diagnosis not present

## 2022-04-13 DIAGNOSIS — R300 Dysuria: Secondary | ICD-10-CM

## 2022-04-13 LAB — URINALYSIS, ROUTINE W REFLEX MICROSCOPIC
Bilirubin, UA: NEGATIVE
Glucose, UA: NEGATIVE
Nitrite, UA: NEGATIVE
Specific Gravity, UA: >1.03 — ABNORMAL HIGH (ref 1.005–1.030)
Urobilinogen, Ur: 0.2 mg/dL (ref 0.2–1.0)
pH, UA: 6 (ref 5.0–7.5)

## 2022-04-13 LAB — MICROSCOPIC EXAMINATION

## 2022-04-13 MED ORDER — NITROFURANTOIN MONOHYD MACRO 100 MG PO CAPS: 100.0000 mg | ORAL_CAPSULE | Freq: Two times a day (BID) | ORAL | 0 refills | Status: DC

## 2022-04-13 MED ORDER — BUPROPION HCL ER (XL) 150 MG PO TB24: 150.0000 mg | ORAL_TABLET | Freq: Every day | ORAL | 1 refills | Status: DC

## 2022-04-13 NOTE — Progress Notes (Signed)
Ht 5' 4"  (1.626 m)   Wt 149 lb 9.6 oz (67.9 kg)   BMI 25.68 kg/m    Subjective:    Patient ID: Veronica Mills, female    DOB: October 13, 1955, 66 y.o.   MRN: [id number]  HPI: Veronica Mills is a 66 y.o. female  Chief Complaint  Patient presents with   Urinary Tract Infection    Patient states she has urinary frequency, burning with urination, and unable to empty bladder. Patient states she noticed this morning some pink on the toilet paper this morning.    URINARY SYMPTOMS Duration: 1 day Dysuria: yes Urinary frequency: yes Urgency: yes Small volume voids: yes Symptom severity: moderate Urinary incontinence: no Foul odor: no Hematuria: yes Abdominal pain: no Back pain: no Suprapubic pain/pressure: no Flank pain: no Fever:  no Vomiting: no Relief with cranberry juice: no Relief with pyridium: no Status: worse Previous urinary tract infection: yes Recurrent urinary tract infection: no History of sexually transmitted disease: no Vaginal discharge: no Treatments attempted: increasing fluids   Relevant past medical, surgical, family and social history reviewed and updated as indicated. Interim medical history since our last visit reviewed. Allergies and medications reviewed and updated.  Review of Systems  Constitutional: Negative.   Respiratory: Negative.    Cardiovascular: Negative.   Gastrointestinal: Negative.   Genitourinary:  Positive for decreased urine volume, dysuria, frequency, hematuria and urgency. Negative for difficulty urinating, dyspareunia, enuresis, flank pain, genital sores, menstrual problem, pelvic pain, vaginal bleeding, vaginal discharge and vaginal pain.  Musculoskeletal: Negative.   Neurological: Negative.   Psychiatric/Behavioral: Negative.      Per HPI unless specifically indicated above     Objective:    Ht 5' 4"  (1.626 m)   Wt 149 lb 9.6 oz (67.9 kg)   BMI 25.68 kg/m   Wt Readings from Last 3 Encounters:  04/13/22 149 lb 9.6 oz (67.9  kg)  03/08/22 151 lb 12.8 oz (68.9 kg)  09/03/21 177 lb 9.6 oz (80.6 kg)    Physical Exam Vitals and nursing note reviewed.  Constitutional:      General: She is not in acute distress.    Appearance: Normal appearance. She is normal weight. She is not ill-appearing, toxic-appearing or diaphoretic.  HENT:     Head: Normocephalic and atraumatic.     Right Ear: External ear normal.     Left Ear: External ear normal.     Nose: Nose normal.     Mouth/Throat:     Mouth: Mucous membranes are moist.     Pharynx: Oropharynx is clear.  Eyes:     General: No scleral icterus.       Right eye: No discharge.        Left eye: No discharge.     Extraocular Movements: Extraocular movements intact.     Conjunctiva/sclera: Conjunctivae normal.     Pupils: Pupils are equal, round, and reactive to light.  Cardiovascular:     Rate and Rhythm: Normal rate and regular rhythm.     Pulses: Normal pulses.     Heart sounds: Normal heart sounds. No murmur heard.    No friction rub. No gallop.  Pulmonary:     Effort: Pulmonary effort is normal. No respiratory distress.     Breath sounds: Normal breath sounds. No stridor. No wheezing, rhonchi or rales.  Chest:     Chest wall: No tenderness.  Abdominal:     Tenderness: There is no right CVA tenderness or left CVA tenderness.  Musculoskeletal:  Cervical back: Normal range of motion and neck supple.  Skin:    General: Skin is warm and dry.     Capillary Refill: Capillary refill takes less than 2 seconds.     Coloration: Skin is not jaundiced or pale.     Findings: No bruising, erythema, lesion or rash.  Neurological:     General: No focal deficit present.     Mental Status: She is alert and oriented to person, place, and time. Mental status is at baseline.  Psychiatric:        Mood and Affect: Mood normal.        Behavior: Behavior normal.        Thought Content: Thought content normal.        Judgment: Judgment normal.     Results for orders  placed or performed in visit on 03/08/22  Microscopic Examination   Urine  Result Value Ref Range   WBC, UA 6-10 (A) 0 - 5 /hpf   RBC, Urine 0-2 0 - 2 /hpf   Epithelial Cells (non renal) 0-10 0 - 10 /hpf   Bacteria, UA None seen None seen/Few  Urine Culture   Specimen: Urine   UR  Result Value Ref Range   Urine Culture, Routine Final report    Organism ID, Bacteria Comment   Comp Met (CMET)  Result Value Ref Range   Glucose 105 (H) 70 - 99 mg/dL   BUN 16 8 - 27 mg/dL   Creatinine, Ser 0.73 0.57 - 1.00 mg/dL   eGFR 92 >59 mL/min/1.73   BUN/Creatinine Ratio 22 12 - 28   Sodium 140 134 - 144 mmol/L   Potassium 4.0 3.5 - 5.2 mmol/L   Chloride 99 96 - 106 mmol/L   CO2 25 20 - 29 mmol/L   Calcium 10.0 8.7 - 10.3 mg/dL   Total Protein 7.3 6.0 - 8.5 g/dL   Albumin 4.8 3.9 - 4.9 g/dL   Globulin, Total 2.5 1.5 - 4.5 g/dL   Albumin/Globulin Ratio 1.9 1.2 - 2.2   Bilirubin Total 0.3 0.0 - 1.2 mg/dL   Alkaline Phosphatase 74 44 - 121 IU/L   AST 17 0 - 40 IU/L   ALT 17 0 - 32 IU/L  Lipid Profile  Result Value Ref Range   Cholesterol, Total 196 100 - 199 mg/dL   Triglycerides 108 0 - 149 mg/dL   HDL 61 >39 mg/dL   VLDL Cholesterol Cal 19 5 - 40 mg/dL   LDL Chol Calc (NIH) 116 (H) 0 - 99 mg/dL   Chol/HDL Ratio 3.2 0.0 - 4.4 ratio  HgB A1c  Result Value Ref Range   Hgb A1c MFr Bld 5.8 (H) 4.8 - 5.6 %   Est. average glucose Bld gHb Est-mCnc 120 mg/dL  TSH  Result Value Ref Range   TSH 1.520 0.450 - 4.500 uIU/mL  Urinalysis, Routine w reflex microscopic  Result Value Ref Range   Specific Gravity, UA 1.020 1.005 - 1.030   pH, UA 6.5 5.0 - 7.5   Color, UA Yellow Yellow   Appearance Ur Clear Clear   Leukocytes,UA 2+ (A) Negative   Protein,UA Negative Negative/Trace   Glucose, UA Negative Negative   Ketones, UA Negative Negative   RBC, UA Negative Negative   Bilirubin, UA Negative Negative   Urobilinogen, Ur 0.2 0.2 - 1.0 mg/dL   Nitrite, UA Negative Negative   Microscopic  Examination See below:   Magnesium  Result Value Ref Range   Magnesium 2.1  1.6 - 2.3 mg/dL  Phosphorus  Result Value Ref Range   Phosphorus 3.6 3.0 - 4.3 mg/dL  C-reactive protein  Result Value Ref Range   CRP 1 0 - 10 mg/L  Cortisol  Result Value Ref Range   Cortisol 10.2 6.2 - 19.4 ug/dL  B12  Result Value Ref Range   Vitamin B-12 768 232 - 1,245 pg/mL  Uric acid  Result Value Ref Range   Uric Acid 2.8 (L) 3.0 - 7.2 mg/dL  Iron, TIBC and Ferritin Panel  Result Value Ref Range   Total Iron Binding Capacity 359 250 - 450 ug/dL   UIBC 265 118 - 369 ug/dL   Iron 94 27 - 139 ug/dL   Iron Saturation 26 15 - 55 %   Ferritin 367 (H) 15 - 150 ng/mL  Transferrin  Result Value Ref Range   Transferrin 282 192 - 364 mg/dL  Insulin-like growth factor  Result Value Ref Range   Insulin-Like GF-1 230 (H) 57 - 202 ng/mL  Homocysteine  Result Value Ref Range   Homocysteine 11.8 0.0 - 17.2 umol/L  Hepatic function panel  Result Value Ref Range   Bilirubin, Direct 0.12 0.00 - 0.40 mg/dL  Insulin, Free and Total  Result Value Ref Range   Free Insulin 5.4 uU/mL   Total Insulin 5.4 uU/mL  T3, free  Result Value Ref Range   T3, Free 3.9 2.0 - 4.4 pg/mL  T3, reverse  Result Value Ref Range   Reverse T3, Serum 12.5 9.2 - 24.1 ng/dL  T4, free  Result Value Ref Range   Free T4 0.98 0.82 - 1.77 ng/dL  Vitamin D (25 hydroxy)  Result Value Ref Range   Vit D, 25-Hydroxy 33.9 30.0 - 100.0 ng/mL  CBC w/Diff  Result Value Ref Range   WBC 6.5 3.4 - 10.8 x10E3/uL   RBC 4.69 3.77 - 5.28 x10E6/uL   Hemoglobin 14.4 11.1 - 15.9 g/dL   Hematocrit 43.8 34.0 - 46.6 %   MCV 93 79 - 97 fL   MCH 30.7 26.6 - 33.0 pg   MCHC 32.9 31.5 - 35.7 g/dL   RDW 12.1 11.7 - 15.4 %   Platelets 245 150 - 450 x10E3/uL   Neutrophils 58 Not Estab. %   Lymphs 30 Not Estab. %   Monocytes 7 Not Estab. %   Eos 4 Not Estab. %   Basos 1 Not Estab. %   Neutrophils Absolute 3.8 1.4 - 7.0 x10E3/uL   Lymphocytes  Absolute 1.9 0.7 - 3.1 x10E3/uL   Monocytes Absolute 0.4 0.1 - 0.9 x10E3/uL   EOS (ABSOLUTE) 0.2 0.0 - 0.4 x10E3/uL   Basophils Absolute 0.0 0.0 - 0.2 x10E3/uL   Immature Granulocytes 0 Not Estab. %   Immature Grans (Abs) 0.0 0.0 - 0.1 x10E3/uL  Thyroid peroxidase antibody  Result Value Ref Range   Thyroperoxidase Ab SerPl-aCnc 11 0 - 34 IU/mL      Assessment & Plan:   Problem List Items Addressed This Visit   None Visit Diagnoses     Acute cystitis with hematuria    -  Primary   Will send for culture and treat with nitrofurantoin. Call if not getting better or getting worse.    Relevant Orders   Urine Culture   Dysuria       Relevant Orders   Urinalysis, Routine w reflex microscopic        Follow up plan: No follow-ups on file.

## 2022-04-13 NOTE — Telephone Encounter (Signed)
  Chief Complaint: Frequency, urgency, pain, burning, pink on tissue Symptoms: ibid Frequency: yesterday - today Pertinent Negatives: Patient denies fever Disposition: [] ED /[] Urgent Care (no appt availability in office) / [x] Appointment(In office/virtual)/ []  [facility]/ [] Home Care/ [] Refused Recommended Disposition /[] [facility]/ []  Follow-up with PCP Additional Notes: Pt felt the start of UTI s/s yesterday, which has progressed. PT hasFrequency, urgency, pain, burning, pink on tissue   Summary: Possible uti?   The patient called in stating she believes she has a Urinary tract infection. She has felt a painful pull after urinating as well as burning. She also reports an urgency to go especially right after she has gone. She states there is a pink color on the toilet paper after she goes. Please assist patient further.   [facility] [place], [place] AT [facility] OF [address] [facility]  Phone: [phone]  Fax: [phone]      Reason for Disposition  Urinating more frequently than usual (i.e., frequency)  Answer Assessment - Initial Assessment Questions 1. SYMPTOM: "What's the main symptom you're concerned about?" (e.g., frequency, incontinence)     Frequency, urgency and burning and pink on Tissue 2. ONSET: "When did the  S/S  start?"     Yesterday  - today 3. PAIN: "Is there any pain?" If Yes, ask: "How bad is it?" (Scale: 1-10; mild, moderate, severe)     5/10 4. CAUSE: "What do you think is causing the symptoms?"     UTI 5. OTHER SYMPTOMS: "Do you have any other symptoms?" (e.g., blood in urine, fever, flank pain, pain with urination)     Pink in urine 6. PREGNANCY: "Is there any chance you are pregnant?" "When was your last menstrual period?"  Protocols used: Urinary Symptoms-A-AH

## 2022-04-13 NOTE — Telephone Encounter (Signed)
Patient in the office today requesting a refill of medication.  Medication sent to the pharmacy.

## 2022-04-16 LAB — URINE CULTURE

## 2022-04-16 NOTE — Progress Notes (Signed)
Hi Veronica Mills.  Your urine culture showed that you grew E Coli in your urine.  The Macrobid that Dr. Wynetta Emery sent for you should take care of the bacteria.  If your symptoms are not improved, please let us know.

## 2022-05-04 ENCOUNTER — Encounter: Payer: Self-pay | Admitting: Nurse Practitioner

## 2022-07-22 ENCOUNTER — Other Ambulatory Visit: Payer: Self-pay | Admitting: Nurse Practitioner

## 2022-07-22 NOTE — Telephone Encounter (Signed)
Unable to refill per protocol, Rx request is too soon.  Requested Prescriptions  Pending Prescriptions Disp Refills   ARMOUR THYROID 30 MG tablet [Pharmacy Med Name: THYROID (ARMOUR) 0.5GR (30MG ) TABS] 90 tablet 3    Sig: TAKE 1 TABLET BY MOUTH DAILY BEFORE BREAKFAST     Endocrinology:  Hypothyroid Agents Passed - 07/22/2022  8:09 AM      Passed - TSH in normal range and within 360 days    TSH  Date Value Ref Range Status  03/08/2022 1.520 0.450 - 4.500 uIU/mL Final         Passed - Valid encounter within last 12 months    Recent Outpatient Visits           3 months ago Acute cystitis with hematuria   [place], DO   4 months ago Annual physical exam   [place], NP   6 months ago [place], PA-C   10 months ago Aortic atherosclerosis [facility])   [place] Jon Billings, NP   1 year ago Seasonal affective disorder [facility])   [place] Jon Billings, NP       Future Appointments             In 1 month [place], Barbaraann Faster, NP [place], [facility]

## 2022-08-29 NOTE — Patient Instructions (Incomplete)
Please call to schedule your mammogram and/or bone density: [facility] at [facility]  Address: [address], [place], [place] [address] Phone: [phone]  [facility] [place] [address] [place],  [place]  [address] Phone: [phone]    Hypothyroidism  Hypothyroidism is when the thyroid gland does not make enough of certain hormones. This is called an underactive thyroid. The thyroid gland is a small gland located in the lower front part of the neck, just in front of the windpipe (trachea). This gland makes hormones that help control how the body uses food for energy (metabolism) as well as how the heart and brain function. These hormones also play a role in keeping your bones strong. When the thyroid is underactive, it produces too little of the hormones thyroxine (T4) and triiodothyronine (T3). What are the causes? This condition may be caused by: Hashimoto's disease. This is a disease in which the body's disease-fighting system (immune system) attacks the thyroid gland. This is the most common cause. Viral infections. Pregnancy. Certain medicines. Birth defects. Problems with a gland in the center of the brain (pituitary gland). Lack of enough iodine in the diet. Other causes may include: Past radiation treatments to the head or neck for cancer. Past treatment with radioactive iodine. Past exposure to radiation in the environment. Past surgical removal of part or all of the thyroid. What increases the risk? You are more likely to develop this condition if: You are female. You have a family history of thyroid conditions. You use a medicine called lithium. You take medicines that affect the immune system (immunosuppressants). What are the signs or symptoms? Common symptoms of this condition include: Not being able to tolerate cold. Feeling as though you have no energy (lethargy). Lack of  appetite. Constipation. Sadness or depression. Weight gain that is not explained by a change in diet or exercise habits. Menstrual irregularity. Dry skin, coarse hair, or brittle nails. Other symptoms may include: Muscle pain. Slowing of thought processes. Poor memory. How is this diagnosed? This condition may be diagnosed based on: Your symptoms, your medical history, and a physical exam. Blood tests. You may also have imaging tests, such as an ultrasound or MRI. How is this treated? This condition is treated with medicine that replaces the thyroid hormones that your body does not make. After you begin treatment, it may take several weeks for symptoms to go away. Follow these instructions at home: Take over-the-counter and prescription medicines only as told by your health care provider. If you start taking any new medicines, tell your health care provider. Keep all follow-up visits as told by your health care provider. This is important. As your condition improves, your dosage of thyroid hormone medicine may change. You will need to have blood tests regularly so that your health care provider can monitor your condition. Contact a health care provider if: Your symptoms do not get better with treatment. You are taking thyroid hormone replacement medicine and you: Sweat a lot. Have tremors. Feel anxious. Lose weight rapidly. Cannot tolerate heat. Have emotional swings. Have diarrhea. Feel weak. Get help right away if: You have chest pain. You have an irregular heartbeat. You have a rapid heartbeat. You have difficulty breathing. These symptoms may be an emergency. Get help right away. Call 911. Do not wait to see if the symptoms will go away. Do not drive yourself to the hospital. Summary Hypothyroidism is when the thyroid gland does not make   enough of certain hormones (it is underactive). When the thyroid is underactive, it produces too little of the hormones thyroxine  (T4) and triiodothyronine (T3). The most common cause is Hashimoto's disease, a disease in which the body's disease-fighting system (immune system) attacks the thyroid gland. The condition can also be caused by viral infections, medicine, pregnancy, or past radiation treatment to the head or neck. Symptoms may include weight gain, dry skin, constipation, feeling as though you do not have energy, and not being able to tolerate cold. This condition is treated with medicine to replace the thyroid hormones that your body does not make. This information is not intended to replace advice given to you by your health care provider. Make sure you discuss any questions you have with your health care provider. Document Revised: 06/09/2021 Document Reviewed: 06/09/2021 Elsevier Patient Education  2023 Elsevier Inc.  

## 2022-09-02 ENCOUNTER — Encounter: Payer: Self-pay | Admitting: Nurse Practitioner

## 2022-09-02 ENCOUNTER — Ambulatory Visit (INDEPENDENT_AMBULATORY_CARE_PROVIDER_SITE_OTHER): Payer: Medicare HMO | Admitting: Nurse Practitioner

## 2022-09-02 VITALS — BP 134/72 | HR 58 | Temp 98.2°F | Ht 64.0 in | Wt 143.0 lb

## 2022-09-02 DIAGNOSIS — E782 Mixed hyperlipidemia: Secondary | ICD-10-CM

## 2022-09-02 DIAGNOSIS — R7301 Impaired fasting glucose: Secondary | ICD-10-CM

## 2022-09-02 DIAGNOSIS — I700 Atherosclerosis of aorta: Secondary | ICD-10-CM

## 2022-09-02 DIAGNOSIS — Z1231 Encounter for screening mammogram for malignant neoplasm of breast: Secondary | ICD-10-CM

## 2022-09-02 DIAGNOSIS — F338 Other recurrent depressive disorders: Secondary | ICD-10-CM | POA: Diagnosis not present

## 2022-09-02 DIAGNOSIS — E039 Hypothyroidism, unspecified: Secondary | ICD-10-CM | POA: Diagnosis not present

## 2022-09-02 DIAGNOSIS — G4733 Obstructive sleep apnea (adult) (pediatric): Secondary | ICD-10-CM

## 2022-09-02 DIAGNOSIS — F5104 Psychophysiologic insomnia: Secondary | ICD-10-CM | POA: Diagnosis not present

## 2022-09-02 DIAGNOSIS — Z114 Encounter for screening for human immunodeficiency virus [HIV]: Secondary | ICD-10-CM | POA: Diagnosis not present

## 2022-09-02 DIAGNOSIS — R69 Illness, unspecified: Secondary | ICD-10-CM | POA: Diagnosis not present

## 2022-09-02 DIAGNOSIS — E663 Overweight: Secondary | ICD-10-CM

## 2022-09-02 MED ORDER — BUPROPION HCL ER (XL) 150 MG PO TB24: 150.0000 mg | ORAL_TABLET | Freq: Every day | ORAL | 4 refills | Status: DC

## 2022-09-02 MED ORDER — ARMOUR THYROID 30 MG PO TABS: 30.0000 mg | ORAL_TABLET | Freq: Every day | ORAL | 4 refills | Status: DC

## 2022-09-02 NOTE — Assessment & Plan Note (Signed)
Chronic, stable.  Continue current medication regimen and adjust as needed. Thyroid labs today. °

## 2022-09-02 NOTE — Assessment & Plan Note (Signed)
Chronic, noted on imaging 02/03/2020.  Discussed guidelines for statin therapy and reviewed her ASCVD score + current labs.  She prefers not to start statin at this time.  Continue to monitor.

## 2022-09-02 NOTE — Assessment & Plan Note (Signed)
Chronic, stable.  Continue with current dental device.  Labs ordered today.  Return to clinic in 6 months for reevaluation.

## 2022-09-02 NOTE — Assessment & Plan Note (Signed)
Chronic, stable.  Denies SI/HI.  Continue current medication regimen and adjust as needed. 

## 2022-09-02 NOTE — Assessment & Plan Note (Signed)
Has been working on diet and weight loss with benefit.  Recheck A1c today.

## 2022-09-02 NOTE — Assessment & Plan Note (Addendum)
Chronic with elevation in LDL.  Discussed guidelines for statin therapy and reviewed her ASCVD score + current labs.  She prefers not to start statin at this time.  Continue to monitor.  Recommend daily Omega 3 at home.

## 2022-09-02 NOTE — Assessment & Plan Note (Signed)
Chronic, ongoing.  Poor tolerance to Trazodone and Melatonin.  Would be cautious performing trial of Belsomra as concern for side effects.  Continue dental device at night and recommend if ongoing consider therapy for CBT which may benefit insomnia.

## 2022-09-02 NOTE — Progress Notes (Signed)
BP 134/72   Pulse (!) 58   Temp 98.2 F (36.8 C) (Oral)   Ht 5' 8.5" (1.74 m)   Wt 143 lb (64.9 kg)   SpO2 99%   BMI 21.42 kg/m    Subjective:    Patient ID: Veronica Mills, female    DOB: February 27, 1956, 67 y.o.   MRN: [id number]  HPI: Veronica Mills is a 67 y.o. female  Chief Complaint  Patient presents with   Hypothyroidism   Hyperlipidemia   HYPOTHYROIDISM Continues on Armour thyroid with benefit. Last TSH 2.070 on 09/03/21. Thyroid control status:stable Satisfied with current treatment? yes Medication side effects: no Medication compliance: good compliance Etiology of hypothyroidism: unknown Recent dose adjustment:no Fatigue: yes -- has not been sleeping well Cold intolerance: no Heat intolerance: no Weight gain: no Weight loss: no Constipation: yes Diarrhea/loose stools: no Palpitations: no Lower extremity edema: no Anxiety/depressed mood:  a little bit    HYPERLIPIDEMIA No current medications.  Aortic atherosclerosis noted on imaging 02/03/2020. Hyperlipidemia status: good compliance Supplements: none Aspirin:  no The 10-year ASCVD risk score (Arnett DK, et al., 2019) is: 9.7%   Values used to calculate the score:     Age: 81 years     Sex: Female     Is Non-Hispanic African American: No     Diabetic: Yes     Tobacco smoker: No     Systolic Blood Pressure: [id number] mmHg     Is BP treated: No     HDL Cholesterol: 61 mg/dL     Total Cholesterol: 196 mg/dL Chest pain:  no Coronary artery disease:  no Family history CAD:  yes -- father in later life lacunar strokes Family history early CAD:  no  Lipid Panel     Component Value Date/Time   CHOL 196 03/08/2022 0842   TRIG 108 03/08/2022 0842   HDL 61 03/08/2022 0842   CHOLHDL 3.2 03/08/2022 0842   LDLCALC 116 (H) 03/08/2022 0842   LABVLDL 19 03/08/2022 0842    PREDIABETES Last A1c 5.8% in September 2023.  Has been working on weight loss and drinking more water.   Polydipsia/polyuria: no Visual  disturbance: no Chest pain: no Paresthesias: no  DEPRESSION Continues on Wellbutrin daily. Has been on for several years.  Does find benefit from this.  Not sleeping well, every other night she sleeps well.  Feels fatigued during daytime.  Can not take Melatonin -- gets severe vertigo.  Takes Magnesium at night, has for years.  Trazodone causes her to be awake. Uses dental device for sleep.  When lies in bed at night will feel like bed is vibrating -- is when she is trying to fall asleep that notices this. Mood status: stable Satisfied with current treatment?: yes Symptom severity: moderate  Duration of current treatment : chronic Side effects: no Medication compliance: good compliance Psychotherapy/counseling: yes in the past Previous psychiatric medications: Cymbalta Depressed mood: yes -- situational with family issues Anxious mood: yes Anhedonia:  when she is tired Significant weight loss or gain: no Insomnia: yes hard to fall asleep and stay asleep Fatigue: yes Feelings of worthlessness or guilt: no Impaired concentration/indecisiveness:  depends on the day and the sleep Suicidal ideations: no Hopelessness: no Crying spells: no    09/02/2022    9:16 AM 04/13/2022    9:29 AM 03/08/2022    8:06 AM 11/30/2021   10:38 AM 09/03/2021    8:48 AM  Depression screen PHQ 2/9  Decreased Interest 1 0 1 2  1  Down, Depressed, Hopeless 1 0 0 2 1  PHQ - 2 Score 2 0 '1 4 2  '$ Altered sleeping '3 2 2 2 2  '$ Tired, decreased energy '3 1 2 1 2  '$ Change in appetite 0 0 0 1 1  Feeling bad or failure about yourself  0 0 0 0 1  Trouble concentrating 0 1 0 0 1  Moving slowly or fidgety/restless 0 0 0 0 0  Suicidal thoughts 0 0 0 0 0  PHQ-9 Score '8 4 5 8 9  '$ Difficult doing work/chores Somewhat difficult Somewhat difficult Not difficult at all Somewhat difficult Not difficult at all       09/02/2022    9:17 AM 04/13/2022    9:30 AM 03/08/2022    8:06 AM 09/03/2021    8:49 AM  GAD 7 : Generalized  Anxiety Score  Nervous, Anxious, on Edge 0 0 0 0  Control/stop worrying 1 0 0 1  Worry too much - different things 1 0 0 0  Trouble relaxing 1 0 0 1  Restless 0 0 0 0  Easily annoyed or irritable 1 0 0 0  Afraid - awful might happen 0 0 0 0  Total GAD 7 Score 4 0 0 2  Anxiety Difficulty Somewhat difficult  Not difficult at all Not difficult at all   Relevant past medical, surgical, family and social history reviewed and updated as indicated. Interim medical history since our last visit reviewed. Allergies and medications reviewed and updated.  Review of Systems  Constitutional:  Positive for fatigue. Negative for activity change, appetite change, diaphoresis and fever.  Respiratory:  Negative for cough, chest tightness, shortness of breath and wheezing.   Cardiovascular:  Negative for chest pain, palpitations and leg swelling.  Gastrointestinal:  Positive for constipation.  Endocrine: Negative for cold intolerance, heat intolerance, polydipsia, polyphagia and polyuria.  Neurological: Negative.  Negative for syncope.  Psychiatric/Behavioral:  Positive for decreased concentration and sleep disturbance. Negative for self-injury and suicidal ideas. The patient is nervous/anxious.     Per HPI unless specifically indicated above     Objective:    BP 134/72   Pulse (!) 58   Temp 98.2 F (36.8 C) (Oral)   Ht 5' 8.5" (1.74 m)   Wt 143 lb (64.9 kg)   SpO2 99%   BMI 21.42 kg/m   Wt Readings from Last 3 Encounters:  09/02/22 143 lb (64.9 kg)  04/13/22 149 lb 9.6 oz (67.9 kg)  03/08/22 151 lb 12.8 oz (68.9 kg)    Physical Exam Vitals and nursing note reviewed.  Constitutional:      General: She is awake. She is not in acute distress.    Appearance: She is well-developed and well-groomed. She is not ill-appearing or toxic-appearing.  HENT:     Head: Normocephalic.     Right Ear: Hearing and external ear normal.     Left Ear: Hearing and external ear normal.  Eyes:     General:  Lids are normal.        Right eye: No discharge.        Left eye: No discharge.     Conjunctiva/sclera: Conjunctivae normal.     Pupils: Pupils are equal, round, and reactive to light.  Neck:     Thyroid: No thyromegaly.     Vascular: No carotid bruit.  Cardiovascular:     Rate and Rhythm: Normal rate and regular rhythm.     Heart sounds: Normal heart sounds.  No murmur heard.    No gallop.  Pulmonary:     Effort: Pulmonary effort is normal. No accessory muscle usage or respiratory distress.     Breath sounds: Normal breath sounds.  Abdominal:     General: Bowel sounds are normal.     Palpations: Abdomen is soft. There is no hepatomegaly or splenomegaly.  Musculoskeletal:     Cervical back: Normal range of motion and neck supple.     Right lower leg: No edema.     Left lower leg: No edema.  Lymphadenopathy:     Cervical: No cervical adenopathy.  Skin:    General: Skin is warm and dry.  Neurological:     Mental Status: She is alert and oriented to person, place, and time.     Deep Tendon Reflexes: Reflexes are normal and symmetric.  Psychiatric:        Attention and Perception: Attention normal.        Mood and Affect: Mood normal.        Speech: Speech normal.        Behavior: Behavior normal. Behavior is cooperative.        Thought Content: Thought content normal.    Results for orders placed or performed in visit on 04/13/22  Urine Culture   Specimen: Urine   UR  Result Value Ref Range   Urine Culture, Routine Final report (A)    Organism ID, Bacteria Escherichia coli (A)    Antimicrobial Susceptibility Comment   Microscopic Examination   Urine  Result Value Ref Range   WBC, UA >30W 0 - 5 /hpf   RBC, Urine >30R 0 - 2 /hpf   Epithelial Cells (non renal) 0-10 0 - 10 /hpf   Mucus, UA Present (A) Not Estab.   Bacteria, UA Moderate (A) None seen/Few  Urinalysis, Routine w reflex microscopic  Result Value Ref Range   Specific Gravity, UA >1.030 (H) 1.005 - 1.030    pH, UA 6.0 5.0 - 7.5   Color, UA Yellow Yellow   Appearance Ur Turbid (A) Clear   Leukocytes,UA 3+ (A) Negative   Protein,UA 2+ (A) Negative/Trace   Glucose, UA Negative Negative   Ketones, UA Trace (A) Negative   RBC, UA 3+ (A) Negative   Bilirubin, UA Negative Negative   Urobilinogen, Ur 0.2 0.2 - 1.0 mg/dL   Nitrite, UA Negative Negative   Microscopic Examination See below:       Assessment & Plan:   Problem List Items Addressed This Visit       Cardiovascular and Mediastinum   Aortic atherosclerosis ([place])    Chronic, noted on imaging 02/03/2020.  Discussed guidelines for statin therapy and reviewed her ASCVD score + current labs.  She prefers not to start statin at this time.  Continue to monitor.      Relevant Orders   Comprehensive metabolic panel   Lipid Panel w/o Chol/HDL Ratio     Respiratory   OSA (obstructive sleep apnea)    Chronic, stable.  Continue with current dental device.  Labs ordered today.  Return to clinic in 6 months for reevaluation.        Relevant Orders   CBC with Differential/Platelet     Endocrine   Hypothyroid    Chronic, stable.  Continue current medication regimen and adjust as needed.  Thyroid labs today.      Relevant Medications   ARMOUR THYROID 30 MG tablet   Other Relevant Orders   T4, free  TSH   IFG (impaired fasting glucose)    Has been working on diet and weight loss with benefit.  Recheck A1c today.      Relevant Orders   HgB A1c     Other   Chronic insomnia (Chronic)    Chronic, ongoing.  Poor tolerance to Trazodone and Melatonin.  Would be cautious performing trial of Belsomra as concern for side effects.  Continue dental device at night and recommend if ongoing consider therapy for CBT which may benefit insomnia.      Hyperlipemia    Chronic with elevation in LDL.  Discussed guidelines for statin therapy and reviewed her ASCVD score + current labs.  She prefers not to start statin at this time.  Continue to  monitor.  Recommend daily Omega 3 at home.      Relevant Orders   Comprehensive metabolic panel   Lipid Panel w/o Chol/HDL Ratio   Seasonal affective disorder (H[facility]) - Primary    Chronic, stable.  Denies SI/HI.  Continue current medication regimen and adjust as needed.        Relevant Medications   buPROPion (WELLBUTRIN XL) 150 MG 24 hr tablet     Follow up plan: Return in about 6 months (around 03/05/2023) for HLD, THYROID, MOOD, INSOMNIA, IFG.

## 2022-09-03 LAB — HEMOGLOBIN A1C
Est. average glucose Bld gHb Est-mCnc: 117 mg/dL
Hgb A1c MFr Bld: 5.7 % — ABNORMAL HIGH (ref 4.8–5.6)

## 2022-09-03 LAB — CBC WITH DIFFERENTIAL/PLATELET
Basophils Absolute: 0 10*3/uL (ref 0.0–0.2)
Basos: 1 %
EOS (ABSOLUTE): 0.2 10*3/uL (ref 0.0–0.4)
Eos: 4 %
Hematocrit: 42.7 % (ref 34.0–46.6)
Hemoglobin: 14.5 g/dL (ref 11.1–15.9)
Immature Grans (Abs): 0 10*3/uL (ref 0.0–0.1)
Immature Granulocytes: 0 %
Lymphocytes Absolute: 2 10*3/uL (ref 0.7–3.1)
Lymphs: 36 %
MCH: 31.4 pg (ref 26.6–33.0)
MCHC: 34 g/dL (ref 31.5–35.7)
MCV: 92 fL (ref 79–97)
Monocytes Absolute: 0.3 10*3/uL (ref 0.1–0.9)
Monocytes: 6 %
Neutrophils Absolute: 2.9 10*3/uL (ref 1.4–7.0)
Neutrophils: 53 %
Platelets: 261 10*3/uL (ref 150–450)
RBC: 4.62 x10E6/uL (ref 3.77–5.28)
RDW: 12.1 % (ref 11.7–15.4)
WBC: 5.5 10*3/uL (ref 3.4–10.8)

## 2022-09-03 LAB — COMPREHENSIVE METABOLIC PANEL
ALT: 20 IU/L (ref 0–32)
AST: 17 IU/L (ref 0–40)
Albumin/Globulin Ratio: 1.8 (ref 1.2–2.2)
Albumin: 4.3 g/dL (ref 3.9–4.9)
Alkaline Phosphatase: 78 IU/L (ref 44–121)
BUN/Creatinine Ratio: 27 (ref 12–28)
BUN: 18 mg/dL (ref 8–27)
Bilirubin Total: 0.4 mg/dL (ref 0.0–1.2)
CO2: 25 mmol/L (ref 20–29)
Calcium: 9.6 mg/dL (ref 8.7–10.3)
Chloride: 101 mmol/L (ref 96–106)
Creatinine, Ser: 0.66 mg/dL (ref 0.57–1.00)
Globulin, Total: 2.4 g/dL (ref 1.5–4.5)
Glucose: 90 mg/dL (ref 70–99)
Potassium: 4.1 mmol/L (ref 3.5–5.2)
Sodium: 140 mmol/L (ref 134–144)
Total Protein: 6.7 g/dL (ref 6.0–8.5)
eGFR: 98 mL/min/{1.73_m2} (ref >59)

## 2022-09-03 LAB — LIPID PANEL W/O CHOL/HDL RATIO
Cholesterol, Total: 233 mg/dL — ABNORMAL HIGH (ref 100–199)
HDL: 62 mg/dL (ref >39)
LDL Chol Calc (NIH): 154 mg/dL — ABNORMAL HIGH (ref 0–99)
Triglycerides: 96 mg/dL (ref 0–149)
VLDL Cholesterol Cal: 17 mg/dL (ref 5–40)

## 2022-09-03 LAB — TSH: TSH: 1.87 u[IU]/mL (ref 0.450–4.500)

## 2022-09-03 LAB — T4, FREE: Free T4: 0.9 ng/dL (ref 0.82–1.77)

## 2022-09-03 NOTE — Progress Notes (Signed)
Contacted via MyChart   Good evening Carollynn, your labs have returned: - Kidney function, creatinine and eGFR, remains normal, as is liver function, AST and ALT.  - A1c remains in prediabetic range, now at 5.7% with no increase, continue diet focus. - CBC shows no anemia or infection. - Thyroid labs remains stable, continue current thyroid dosing. - Cholesterol labs remain elevated, some trend up this check.  I know you prefer no statin therapy.  I highly recommend reviewing the American Heart Association website for information on cholesterol and diet + they have some great recipes.  We will continue to monitor.  Any questions? Keep being amazing!!  Thank you for allowing me to participate in your care.  I appreciate you. Kindest regards, Sevastian Witczak

## 2022-09-06 ENCOUNTER — Ambulatory Visit: Payer: Medicare HMO | Admitting: Nurse Practitioner

## 2022-10-07 DIAGNOSIS — H43813 Vitreous degeneration, bilateral: Secondary | ICD-10-CM | POA: Diagnosis not present

## 2022-10-07 DIAGNOSIS — E119 Type 2 diabetes mellitus without complications: Secondary | ICD-10-CM | POA: Diagnosis not present

## 2022-10-07 DIAGNOSIS — H40053 Ocular hypertension, bilateral: Secondary | ICD-10-CM | POA: Diagnosis not present

## 2022-12-13 ENCOUNTER — Ambulatory Visit (INDEPENDENT_AMBULATORY_CARE_PROVIDER_SITE_OTHER): Payer: Medicare HMO

## 2022-12-13 VITALS — Ht 64.0 in | Wt 143.0 lb

## 2022-12-13 DIAGNOSIS — Z0000 Encounter for general adult medical examination without abnormal findings: Secondary | ICD-10-CM

## 2022-12-13 NOTE — Patient Instructions (Signed)
Veronica Mills , Thank you for taking time to come for your Medicare Wellness Visit. I appreciate your ongoing commitment to your health goals. Please review the following plan we discussed and let me know if I can assist you in the future.   These are the goals we discussed:  Goals      DIET - EAT MORE FRUITS AND VEGETABLES     Patient Stated     11/28/2020, lose weight and lower A1C     Patient Stated     Would like to loose weight        This is a list of the screening recommended for you and due dates:  Health Maintenance  Topic Date Due   HIV Screening  Never done   DTaP/Tdap/Td vaccine (1 - Tdap) Never done   Zoster (Shingles) Vaccine (1 of 2) Never done   Mammogram  08/25/2015   COVID-19 Vaccine (5 - 2023-24 season) 02/19/2022   Flu Shot  01/20/2023   Medicare Annual Wellness Visit  12/13/2023   Cologuard (Stool DNA test)  12/15/2024   Hepatitis C Screening  Completed   HPV Vaccine  Aged Out    Advanced directives: no  Conditions/risks identified: none  Next appointment: Follow up in one year for your annual wellness visit. 12/19/23 @ 1:00 pm by phone  Preventive Care 40-64 Years, Female Preventive care refers to lifestyle choices and visits with your health care provider that can promote health and wellness. What does preventive care include? A yearly physical exam. This is also called an annual well check. Dental exams once or twice a year. Routine eye exams. Ask your health care provider how often you should have your eyes checked. Personal lifestyle choices, including: Daily care of your teeth and gums. Regular physical activity. Eating a healthy diet. Avoiding tobacco and drug use. Limiting alcohol use. Practicing safe sex. Taking low-dose aspirin daily starting at age 47. Taking vitamin and mineral supplements as recommended by your health care provider. What happens during an annual well check? The services and screenings done by your health care provider  during your annual well check will depend on your age, overall health, lifestyle risk factors, and family history of disease. Counseling  Your health care provider may ask you questions about your: Alcohol use. Tobacco use. Drug use. Emotional well-being. Home and relationship well-being. Sexual activity. Eating habits. Work and work Astronomer. Method of birth control. Menstrual cycle. Pregnancy history. Screening  You may have the following tests or measurements: Height, weight, and BMI. Blood pressure. Lipid and cholesterol levels. These may be checked every 5 years, or more frequently if you are over 2 years old. Skin check. Lung cancer screening. You may have this screening every year starting at age 29 if you have a 30-pack-year history of smoking and currently smoke or have quit within the past 15 years. Fecal occult blood test (FOBT) of the stool. You may have this test every year starting at age 23. Flexible sigmoidoscopy or colonoscopy. You may have a sigmoidoscopy every 5 years or a colonoscopy every 10 years starting at age 58. Hepatitis C blood test. Hepatitis B blood test. Sexually transmitted disease (STD) testing. Diabetes screening. This is done by checking your blood sugar (glucose) after you have not eaten for a while (fasting). You may have this done every 1-3 years. Mammogram. This may be done every 1-2 years. Talk to your health care provider about when you should start having regular mammograms. This may depend on  whether you have a family history of breast cancer. BRCA-related cancer screening. This may be done if you have a family history of breast, ovarian, tubal, or peritoneal cancers. Pelvic exam and Pap test. This may be done every 3 years starting at age 64. Starting at age 49, this may be done every 5 years if you have a Pap test in combination with an HPV test. Bone density scan. This is done to screen for osteoporosis. You may have this scan if you are  at high risk for osteoporosis. Discuss your test results, treatment options, and if necessary, the need for more tests with your health care provider. Vaccines  Your health care provider may recommend certain vaccines, such as: Influenza vaccine. This is recommended every year. Tetanus, diphtheria, and acellular pertussis (Tdap, Td) vaccine. You may need a Td booster every 10 years. Zoster vaccine. You may need this after age 51. Pneumococcal 13-valent conjugate (PCV13) vaccine. You may need this if you have certain conditions and were not previously vaccinated. Pneumococcal polysaccharide (PPSV23) vaccine. You may need one or two doses if you smoke cigarettes or if you have certain conditions. Talk to your health care provider about which screenings and vaccines you need and how often you need them. This information is not intended to replace advice given to you by your health care provider. Make sure you discuss any questions you have with your health care provider. Document Released: 07/04/2015 Document Revised: 02/25/2016 Document Reviewed: 04/08/2015 Elsevier Interactive Patient Education  2017 [employer].    Fall Prevention in the Home Falls can cause injuries. They can happen to people of all ages. There are many things you can do to make your home safe and to help prevent falls. What can I do on the outside of my home? Regularly fix the edges of walkways and driveways and fix any cracks. Remove anything that might make you trip as you walk through a door, such as a raised step or threshold. Trim any bushes or trees on the path to your home. Use bright outdoor lighting. Clear any walking paths of anything that might make someone trip, such as rocks or tools. Regularly check to see if handrails are loose or broken. Make sure that both sides of any steps have handrails. Any raised decks and porches should have guardrails on the edges. Have any leaves, snow, or ice cleared  regularly. Use sand or salt on walking paths during winter. Clean up any spills in your garage right away. This includes oil or grease spills. What can I do in the bathroom? Use night lights. Install grab bars by the toilet and in the tub and shower. Do not use towel bars as grab bars. Use non-skid mats or decals in the tub or shower. If you need to sit down in the shower, use a plastic, non-slip stool. Keep the floor dry. Clean up any water that spills on the floor as soon as it happens. Remove soap buildup in the tub or shower regularly. Attach bath mats securely with double-sided non-slip rug tape. Do not have throw rugs and other things on the floor that can make you trip. What can I do in the bedroom? Use night lights. Make sure that you have a light by your bed that is easy to reach. Do not use any sheets or blankets that are too big for your bed. They should not hang down onto the floor. Have a firm chair that has side arms. You can use this  for support while you get dressed. Do not have throw rugs and other things on the floor that can make you trip. What can I do in the kitchen? Clean up any spills right away. Avoid walking on wet floors. Keep items that you use a lot in easy-to-reach places. If you need to reach something above you, use a strong step stool that has a grab bar. Keep electrical cords out of the way. Do not use floor polish or wax that makes floors slippery. If you must use wax, use non-skid floor wax. Do not have throw rugs and other things on the floor that can make you trip. What can I do with my stairs? Do not leave any items on the stairs. Make sure that there are handrails on both sides of the stairs and use them. Fix handrails that are broken or loose. Make sure that handrails are as long as the stairways. Check any carpeting to make sure that it is firmly attached to the stairs. Fix any carpet that is loose or worn. Avoid having throw rugs at the top or  bottom of the stairs. If you do have throw rugs, attach them to the floor with carpet tape. Make sure that you have a light switch at the top of the stairs and the bottom of the stairs. If you do not have them, ask someone to add them for you. What else can I do to help prevent falls? Wear shoes that: Do not have high heels. Have rubber bottoms. Are comfortable and fit you well. Are closed at the toe. Do not wear sandals. If you use a stepladder: Make sure that it is fully opened. Do not climb a closed stepladder. Make sure that both sides of the stepladder are locked into place. Ask someone to hold it for you, if possible. Clearly mark and make sure that you can see: Any grab bars or handrails. First and last steps. Where the edge of each step is. Use tools that help you move around (mobility aids) if they are needed. These include: Canes. Walkers. Scooters. Crutches. Turn on the lights when you go into a dark area. Replace any light bulbs as soon as they burn out. Set up your furniture so you have a clear path. Avoid moving your furniture around. If any of your floors are uneven, fix them. If there are any pets around you, be aware of where they are. Review your medicines with your doctor. Some medicines can make you feel dizzy. This can increase your chance of falling. Ask your doctor what other things that you can do to help prevent falls. This information is not intended to replace advice given to you by your health care provider. Make sure you discuss any questions you have with your health care provider. Document Released: 04/03/2009 Document Revised: 11/13/2015 Document Reviewed: 07/12/2014 Elsevier Interactive Patient Education  2017 [employer].

## 2022-12-13 NOTE — Progress Notes (Signed)
Subjective:   Veronica Mills is a 67 y.o. female who presents for Medicare Annual (Subsequent) preventive examination.  Visit Complete: Virtual  I connected with  Veronica Mills on 12/13/22 by a audio enabled telemedicine application and verified that I am speaking with the correct person using two identifiers.  Patient Location: Home  Provider Location: Office/Clinic  I discussed the limitations of evaluation and management by telemedicine. The patient expressed understanding and agreed to proceed.   Review of Systems     Cardiac Risk Factors include: family history of premature cardiovascular disease     Objective:    Today's Vitals   12/13/22 1528 12/13/22 1543  Weight:  143 lb (64.9 kg)  Height:  5\' 4"  (1.626 m)  PainSc: 4     Body mass index is 24.55 kg/m.     12/13/2022    3:33 PM 11/30/2021   10:50 AM 11/28/2020   11:19 AM 02/03/2020    2:33 AM 11/12/2019    9:11 AM  Advanced Directives  Does Patient Have a Medical Advance Directive? No No No No No  Would patient like information on creating a medical advance directive? No - Patient declined No - Patient declined       Current Medications (verified) Outpatient Encounter Medications as of 12/13/2022  Medication Sig   albuterol (VENTOLIN HFA) 108 (90 Base) MCG/ACT inhaler Inhale 2 puffs into the lungs every 6 (six) hours as needed for wheezing or shortness of breath.   ARMOUR THYROID 30 MG tablet Take 1 tablet (30 mg total) by mouth daily before breakfast.   buPROPion (WELLBUTRIN XL) 150 MG 24 hr tablet Take 1 tablet (150 mg total) by mouth daily.   cyclobenzaprine (FLEXERIL) 5 MG tablet Take 1 tablet (5 mg total) by mouth at bedtime.   glucose blood (ONETOUCH ULTRA) test strip USE TO CHECK BLOOD SUGAR ONCE DAILY   OneTouch Delica Lancets 30G MISC    No facility-administered encounter medications on file as of 12/13/2022.    Allergies (verified) Plaquenil [hydroxychloroquine sulfate], Rocephin [ceftriaxone sodium  in dextrose], Melatonin, Pneumococcal vaccines, Prednisone, Erythromycin, Macrobid [nitrofurantoin], and Trazodone and nefazodone   History: Past Medical History:  Diagnosis Date   Allergy    Anxiety    Bursitis of both hips    Depression    Insomnia    Lyme disease    Sleep apnea    Sleep apnea    Thyroid disease    Past Surgical History:  Procedure Laterality Date   SHOULDER SURGERY Left 05/2009   TOTAL ABDOMINAL HYSTERECTOMY  06/21/1997   Family History  Problem Relation Age of Onset   Mental illness Mother    Stroke Father    Dementia Father    Depression Sister    Hypertension Sister    Obesity Sister    Depression Daughter    Depression Daughter    Diabetes Maternal Grandfather    Congestive Heart Failure Paternal Grandmother    Cancer Paternal Grandfather    Social History   Socioeconomic History   Marital status: Married    Spouse name: Not on file   Number of children: Not on file   Years of education: Not on file   Highest education level: Master's degree (e.g., MA, MS, MEng, MEd, MSW, MBA)  Occupational History   Occupation: disabled   Tobacco Use   Smoking status: Former    Packs/day: 0.00    Years: 0.00    Additional pack years: 0.00    Total pack  years: 0.00    Types: Cigarettes    Quit date: 62    Years since quitting: 40.5   Smokeless tobacco: Never  Vaping Use   Vaping Use: Never used  Substance and Sexual Activity   Alcohol use: Not Currently    Comment: rarely   Drug use: No   Sexual activity: Not Currently    Birth control/protection: Surgical  Other Topics Concern   Not on file  Social History Narrative   Still new to the area   Social Determinants of Health   Financial Resource Strain: Low Risk  (12/13/2022)   Overall Financial Resource Strain (CARDIA)    Difficulty of Paying Living Expenses: Not hard at all  Food Insecurity: No Food Insecurity (12/13/2022)   Hunger Vital Sign    Worried About Running Out of Food in the  Last Year: Never true    Ran Out of Food in the Last Year: Never true  Transportation Needs: No Transportation Needs (12/13/2022)   PRAPARE - Administrator, Civil Service (Medical): No    Lack of Transportation (Non-Medical): No  Physical Activity: Insufficiently Active (12/13/2022)   Exercise Vital Sign    Days of Exercise per Week: 3 days    Minutes of Exercise per Session: 30 min  Stress: No Stress Concern Present (12/13/2022)   [employer] of Occupational Health - Occupational Stress Questionnaire    Feeling of Stress : Not at all  Social Connections: Moderately Isolated (12/13/2022)   Social Connection and Isolation Panel [NHANES]    Frequency of Communication with Friends and Family: Three times a week    Frequency of Social Gatherings with Friends and Family: Never    Attends Religious Services: Never    Database administrator or Organizations: No    Attends Engineer, structural: Never    Marital Status: Married    Tobacco Counseling Counseling given: Not Answered   Clinical Intake:  Pre-visit preparation completed: Yes  Pain : 0-10 Pain Score: 4  Pain Type: Chronic pain Pain Location: Elbow Pain Orientation: Left     Nutritional Risks: None Diabetes: No  How often do you need to have someone help you when you read instructions, pamphlets, or other written materials from your doctor or pharmacy?: 1 - Never  Interpreter Needed?: No  Information entered by :: Kennedy Bucker, LPN   Activities of Daily Living    12/13/2022    3:29 PM  In your present state of health, do you have any difficulty performing the following activities:  Hearing? 1  Comment no aids  Vision? 0  Comment wears glasses  Difficulty concentrating or making decisions? 0  Walking or climbing stairs? 0  Dressing or bathing? 0  Doing errands, shopping? 0  Preparing Food and eating ? N  Using the Toilet? N  In the past six months, have you accidently leaked urine?  N  Do you have problems with loss of bowel control? N  Managing your Medications? N  Managing your Finances? N  Housekeeping or managing your Housekeeping? N    Patient Care Team: Marjie Skiff, NP as PCP - General (Nurse Practitioner)  Indicate any recent Medical Services you may have received from other than [facility] providers in the past year (date may be approximate).     Assessment:   This is a routine wellness examination for Veronica Mills.  Hearing/Vision screen Hearing Screening - Comments:: No aids Vision Screening - Comments:: Wears glasses- Dr.Woodard  Dietary issues  and exercise activities discussed:     Goals Addressed             This Visit's Progress    DIET - EAT MORE FRUITS AND VEGETABLES         Depression Screen    12/13/2022    3:32 PM 09/02/2022    9:16 AM 04/13/2022    9:29 AM 03/08/2022    8:06 AM 11/30/2021   10:38 AM 09/03/2021    8:48 AM 03/05/2021   12:11 PM  PHQ 2/9 Scores  PHQ - 2 Score 0 2 0 1 4 2 2   PHQ- 9 Score 0 8 4 5 8 9 12     Fall Risk    12/13/2022    3:34 PM 09/02/2022    9:16 AM 03/08/2022    8:06 AM 11/30/2021   10:50 AM 09/03/2021    8:48 AM  Fall Risk   Falls in the past year? 0 0 0 0 0  Number falls in past yr: 0 0 0 0 0  Injury with Fall? 0 0 0 0 0  Risk for fall due to : No Fall Risks No Fall Risks No Fall Risks  No Fall Risks  Follow up Falls prevention discussed;Falls evaluation completed Falls evaluation completed Falls evaluation completed Falls evaluation completed;Education provided;Falls prevention discussed Falls evaluation completed    MEDICARE RISK AT HOME:  Medicare Risk at Home - 12/13/22 1534     Any stairs in or around the home? Yes    If so, are there any without handrails? No    Home free of loose throw rugs in walkways, pet beds, electrical cords, etc? Yes    Adequate lighting in your home to reduce risk of falls? Yes    Life alert? No    Use of a cane, walker or w/c? No    Grab bars in the bathroom? No     Shower chair or bench in shower? Yes    Elevated toilet seat or a handicapped toilet? No             TIMED UP AND GO:  Was the test performed?  No    Cognitive Function:        12/13/2022    3:39 PM 11/30/2021   10:35 AM 11/28/2020   11:23 AM 11/08/2018   11:06 AM 10/16/2017   11:45 AM  6CIT Screen  What Year? 0 points 0 points 0 points 0 points 0 points  What month? 0 points 0 points 0 points 0 points 0 points  What time? 0 points 0 points 0 points 0 points 0 points  Count back from 20 0 points 0 points 0 points 0 points 0 points  Months in reverse 0 points 0 points 0 points 0 points 0 points  Repeat phrase 0 points 0 points 0 points 0 points 0 points  Total Score 0 points 0 points 0 points 0 points 0 points    Immunizations Immunization History  Administered Date(s) Administered   Moderna Sars-Covid-2 Vaccination 04/23/2020, 10/17/2020   PFIZER(Purple Top)SARS-COV-2 Vaccination 09/07/2019, 10/02/2019   Pneumococcal Polysaccharide-23 04/26/1999    TDAP status: Due, Education has been provided regarding the importance of this vaccine. Advised may receive this vaccine at local pharmacy or Health Dept. Aware to provide a copy of the vaccination record if obtained from local pharmacy or Health Dept. Verbalized acceptance and understanding.  Flu Vaccine status: Declined, Education has been provided regarding the importance of this vaccine but patient still  declined. Advised may receive this vaccine at local pharmacy or Health Dept. Aware to provide a copy of the vaccination record if obtained from local pharmacy or Health Dept. Verbalized acceptance and understanding.  Pneumococcal vaccine status: Declined,  Education has been provided regarding the importance of this vaccine but patient still declined. Advised may receive this vaccine at local pharmacy or Health Dept. Aware to provide a copy of the vaccination record if obtained from local pharmacy or Health Dept. Verbalized  acceptance and understanding.   Covid-19 vaccine status: Completed vaccines  Qualifies for Shingles Vaccine? Yes   Zostavax completed No   Shingrix Completed?: No.    Education has been provided regarding the importance of this vaccine. Patient has been advised to call insurance company to determine out of pocket expense if they have not yet received this vaccine. Advised may also receive vaccine at local pharmacy or Health Dept. Verbalized acceptance and understanding.  Screening Tests Health Maintenance  Topic Date Due   HIV Screening  Never done   DTaP/Tdap/Td (1 - Tdap) Never done   Zoster Vaccines- Shingrix (1 of 2) Never done   MAMMOGRAM  08/25/2015   COVID-19 Vaccine (5 - 2023-24 season) 02/19/2022   INFLUENZA VACCINE  01/20/2023   Medicare Annual Wellness (AWV)  12/13/2023   Fecal DNA (Cologuard)  12/15/2024   Hepatitis C Screening  Completed   HPV VACCINES  Aged Out    Health Maintenance  Health Maintenance Due  Topic Date Due   HIV Screening  Never done   DTaP/Tdap/Td (1 - Tdap) Never done   Zoster Vaccines- Shingrix (1 of 2) Never done   MAMMOGRAM  08/25/2015   COVID-19 Vaccine (5 - 2023-24 season) 02/19/2022    Colorectal cancer screening: Type of screening: Cologuard. Completed 12/15/21. Repeat every 3 years  Declined referral for mammogram   Lung Cancer Screening: (Low Dose CT Chest recommended if Age 27-80 years, 20 pack-year currently smoking OR have quit w/in 15years.) does not qualify.    Additional Screening:  Hepatitis C Screening: does qualify; Completed 10/13/17  Vision Screening: Recommended annual ophthalmology exams for early detection of glaucoma and other disorders of the eye. Is the patient up to date with their annual eye exam?  Yes  Who is the provider or what is the name of the office in which the patient attends annual eye exams? Dr.Woodard If pt is not established with a provider, would they like to be referred to a provider to establish  care? No .   Dental Screening: Recommended annual dental exams for proper oral hygiene   Community Resource Referral / Chronic Care Management: CRR required this visit?  No   CCM required this visit?  No     Plan:     I have personally reviewed and noted the following in the patient's chart:   Medical and social history Use of alcohol, tobacco or illicit drugs  Current medications and supplements including opioid prescriptions. Patient is not currently taking opioid prescriptions. Functional ability and status Nutritional status Physical activity Advanced directives List of other physicians Hospitalizations, surgeries, and ER visits in previous 12 months Vitals Screenings to include cognitive, depression, and falls Referrals and appointments  In addition, I have reviewed and discussed with patient certain preventive protocols, quality metrics, and best practice recommendations. A written personalized care plan for preventive services as well as general preventive health recommendations were provided to patient.     Hal Hope, LPN   01/29/9146   After  Visit Summary: (MyChart) Due to this being a telephonic visit, the after visit summary with patients personalized plan was offered to patient via MyChart   Nurse Notes: none

## 2023-01-12 ENCOUNTER — Telehealth: Payer: Medicare HMO | Admitting: Nurse Practitioner

## 2023-01-12 DIAGNOSIS — M25522 Pain in left elbow: Secondary | ICD-10-CM

## 2023-01-12 MED ORDER — MELOXICAM 15 MG PO TABS: 15.0000 mg | ORAL_TABLET | Freq: Every day | ORAL | 2 refills | Status: DC

## 2023-01-12 NOTE — Patient Instructions (Signed)
  Veronica Mills, thank you for joining Bennie Pierini, FNP for today's virtual visit.  While this provider is not your primary care provider (PCP), if your PCP is located in our provider database this encounter information will be shared with them immediately following your visit.   A [facility] MyChart account gives you access to today's visit and all your visits, tests, and labs performed at [facility] " click here if you don't have a [facility] MyChart account or go to [email or web address]  Consent: (Patient) Veronica Mills provided verbal consent for this virtual visit at the beginning of the encounter.  Current Medications:  Current Outpatient Medications:    meloxicam (MOBIC) 15 MG tablet, Take 1 tablet (15 mg total) by mouth daily., Disp: 30 tablet, Rfl: 2   albuterol (VENTOLIN HFA) 108 (90 Base) MCG/ACT inhaler, Inhale 2 puffs into the lungs every 6 (six) hours as needed for wheezing or shortness of breath., Disp: 8 g, Rfl: 0   ARMOUR THYROID 30 MG tablet, Take 1 tablet (30 mg total) by mouth daily before breakfast., Disp: 90 tablet, Rfl: 4   buPROPion (WELLBUTRIN XL) 150 MG 24 hr tablet, Take 1 tablet (150 mg total) by mouth daily., Disp: 90 tablet, Rfl: 4   cyclobenzaprine (FLEXERIL) 5 MG tablet, Take 1 tablet (5 mg total) by mouth at bedtime., Disp: 30 tablet, Rfl: 1   glucose blood (ONETOUCH ULTRA) test strip, USE TO CHECK BLOOD SUGAR ONCE DAILY, Disp: 100 strip, Rfl: 12   OneTouch Delica Lancets 30G MISC, , Disp: , Rfl:    Medications ordered in this encounter:  Meds ordered this encounter  Medications   meloxicam (MOBIC) 15 MG tablet    Sig: Take 1 tablet (15 mg total) by mouth daily.    Dispense:  30 tablet    Refill:  2    Order Specific Question:   Supervising Provider    Answer:   Merrilee Jansky [id number]     *If you need refills on other medications prior to your next appointment, please contact your pharmacy*  Follow-Up: Call back or seek  an in-person evaluation if the symptoms worsen or if the condition fails to improve as anticipated.  [facility] [phone]  Other Instructions Ice [facility]  RTO prn   If you have been instructed to have an in-person evaluation today at a local Urgent Care facility, please use the link below. It will take you to a list of all of our available [facility], including address, phone number and hours of operation. Please do not delay care.  [facility]  If you or a family member do not have a primary care provider, use the link below to schedule a visit and establish care. When you choose a [facility] primary care physician or advanced practice provider, you gain a long-term partner in health. Find a Primary Care Provider  Learn more about [facility]'s in-office and virtual care options:  - Get Care Now

## 2023-01-12 NOTE — Progress Notes (Signed)
Virtual Visit Consent   Veronica Mills, you are scheduled for a virtual visit with Veronica Daphine Deutscher, FNP, a [facility] provider, today.     Just as with appointments in the office, your consent must be obtained to participate.  Your consent will be active for this visit and any virtual visit you may have with one of our providers in the next 365 days.     If you have a MyChart account, a copy of this consent can be sent to you electronically.  All virtual visits are billed to your insurance company just like a traditional visit in the office.    As this is a virtual visit, video technology does not allow for your provider to perform a traditional examination.  This may limit your provider's ability to fully assess your condition.  If your provider identifies any concerns that need to be evaluated in person or the need to arrange testing (such as labs, EKG, etc.), we will make arrangements to do so.     Although advances in technology are sophisticated, we cannot ensure that it will always work on either your end or our end.  If the connection with a video visit is poor, the visit may have to be switched to a telephone visit.  With either a video or telephone visit, we are not always able to ensure that we have a secure connection.     I need to obtain your verbal consent now.   Are you willing to proceed with your visit today? YES   Veronica Mills has provided verbal consent on 01/12/2023 for a virtual visit (video or telephone).   Veronica Daphine Deutscher, FNP   Date: 01/12/2023 10:17 AM   Virtual Visit via Video Note   I, Veronica Mills, connected with Veronica Mills ([id number], 07-28-1955) on 01/12/23 at 10:30 AM EDT by a video-enabled telemedicine application and verified that I am speaking with the correct person using two identifiers.  Location: Patient: Virtual Visit Location Patient: Home Provider: Virtual Visit Location Provider: Mobile   I discussed the limitations of  evaluation and management by telemedicine and the availability of in person appointments. The patient expressed understanding and agreed to proceed.    History of Present Illness: Veronica Mills is a 67 y.o. who identifies as a female who was assigned female at birth, and is being seen today for medication refill.  HPI: Patient has old injury of left elbow. Has had pain and limited ROM for several years. Has recently flare up. Mobic really helps when she has a flare up and she is almost out of mobic.    Review of Systems  Constitutional:  Negative for diaphoresis and weight loss.  Eyes:  Negative for blurred vision, double vision and pain.  Respiratory:  Negative for shortness of breath.   Cardiovascular:  Negative for chest pain, palpitations, orthopnea and leg swelling.  Gastrointestinal:  Negative for abdominal pain.  Skin:  Negative for rash.  Neurological:  Negative for dizziness, sensory change, loss of consciousness, weakness and headaches.  Endo/Heme/Allergies:  Negative for polydipsia. Does not bruise/bleed easily.  Psychiatric/Behavioral:  Negative for memory loss. The patient does not have insomnia.   All other systems reviewed and are negative.   Problems:  Patient Active Problem List   Diagnosis Date Noted   Aortic atherosclerosis (HCC) 05/18/2020   Seasonal affective disorder (HCC) 04/17/2020   OSA (obstructive sleep apnea) 05/22/2019   History of Lyme disease 08/30/2018   IFG (impaired fasting glucose) 07/29/2018  Hyperlipemia 07/25/2018   Chronic insomnia 09/07/2017   Hypothyroid 09/07/2017    Allergies:  Allergies  Allergen Reactions   Plaquenil [Hydroxychloroquine Sulfate] Itching   Rocephin [Ceftriaxone Sodium In Dextrose] Hives   Melatonin Other (See Comments)    Severe vertigo   Pneumococcal Vaccines Other (See Comments)   Prednisone Other (See Comments)    Increased pain sxs   Erythromycin     UTI   Macrobid [Nitrofurantoin] Rash   Trazodone And  Nefazodone     Makes patient hyper. Ineffective.   Medications:  Current Outpatient Medications:    albuterol (VENTOLIN HFA) 108 (90 Base) MCG/ACT inhaler, Inhale 2 puffs into the lungs every 6 (six) hours as needed for wheezing or shortness of breath., Disp: 8 g, Rfl: 0   ARMOUR THYROID 30 MG tablet, Take 1 tablet (30 mg total) by mouth daily before breakfast., Disp: 90 tablet, Rfl: 4   buPROPion (WELLBUTRIN XL) 150 MG 24 hr tablet, Take 1 tablet (150 mg total) by mouth daily., Disp: 90 tablet, Rfl: 4   cyclobenzaprine (FLEXERIL) 5 MG tablet, Take 1 tablet (5 mg total) by mouth at bedtime., Disp: 30 tablet, Rfl: 1   glucose blood (ONETOUCH ULTRA) test strip, USE TO CHECK BLOOD SUGAR ONCE DAILY, Disp: 100 strip, Rfl: 12   OneTouch Delica Lancets 30G MISC, , Disp: , Rfl:   Observations/Objective: Patient is well-developed, well-nourished in no acute distress.  Resting comfortably  at home.  Head is normocephalic, atraumatic.  No labored breathing.  Speech is clear and coherent with logical content.  Patient is alert and oriented at baseline.  FROM of left elbow with pain on movemnet on any direction No edema noted   Assessment and Plan:  Veronica Mills in today with chief complaint of Medication Refill   1. Left elbow pain Ice Rest  RTO prn - meloxicam (MOBIC) 15 MG tablet; Take 1 tablet (15 mg total) by mouth daily.  Dispense: 30 tablet; Refill: 2    Follow Up Instructions: I discussed the assessment and treatment plan with the patient. The patient was provided an opportunity to ask questions and all were answered. The patient agreed with the plan and demonstrated an understanding of the instructions.  A copy of instructions were sent to the patient via MyChart.  The patient was advised to call back or seek an in-person evaluation if the symptoms worsen or if the condition fails to improve as anticipated.  Time:  I spent 6 minutes with the patient via telehealth technology  discussing the above problems/concerns.    Veronica Daphine Deutscher, FNP

## 2023-01-26 ENCOUNTER — Other Ambulatory Visit: Payer: Self-pay | Admitting: Nurse Practitioner

## 2023-01-27 NOTE — Telephone Encounter (Signed)
Requested Prescriptions  Pending Prescriptions Disp Refills   glucose blood (ONETOUCH ULTRA) test strip [Pharmacy Med Name: ONE TOUCH ULTRA BLUE TESTST(NEW)100] 100 strip 0    Sig: USE AS DIRECTED EVERY DAY     Endocrinology: Diabetes - Testing Supplies Passed - 01/26/2023  3:17 AM      Passed - Valid encounter within last 12 months    Recent Outpatient Visits           4 months ago Seasonal affective disorder (HCC)   [facility] [place], Corrie Dandy T, NP   9 months ago Acute cystitis with hematuria   [place] [facility] [place], Megan P, DO   10 months ago Annual physical exam   [facility] Larae Grooms, NP   1 year ago COVID-19   [place] [facility] [place], Oswaldo Conroy, PA-C   1 year ago Aortic atherosclerosis [facility])   [facility] Larae Grooms, NP       Future Appointments             In 1 month [place], Dorie Rank, NP [facility]

## 2023-03-05 NOTE — Patient Instructions (Signed)
Be Involved in Caring For Your Health:  Taking Medications When medications are taken as directed, they can greatly improve your health. But if they are not taken as prescribed, they may not work. In some cases, not taking them correctly can be harmful. To help ensure your treatment remains effective and safe, understand your medications and how to take them. Bring your medications to each visit for review by your provider.  Your lab results, notes, and after visit summary will be available on My Chart. We strongly encourage you to use this feature. If lab results are abnormal the clinic will contact you with the appropriate steps. If the clinic does not contact you assume the results are satisfactory. You can always view your results on My Chart. If you have questions regarding your health or results, please contact the clinic during office hours. You can also ask questions on My Chart.  We at [facility] are grateful that you chose [place] to provide your care. We strive to provide evidence-based and compassionate care and are always looking for feedback. If you get a survey from the clinic please complete this so we can hear your opinions.  Hypothyroidism  Hypothyroidism is when the thyroid gland does not make enough of certain hormones. This is called an underactive thyroid. The thyroid gland is a small gland located in the lower front part of the neck, just in front of the windpipe (trachea). This gland makes hormones that help control how the body uses food for energy (metabolism) as well as how the heart and brain function. These hormones also play a role in keeping your bones strong. When the thyroid is underactive, it produces too little of the hormones thyroxine (T4) and triiodothyronine (T3). What are the causes? This condition may be caused by: Hashimoto's disease. This is a disease in which the body's disease-fighting system (immune system) attacks the thyroid gland. This is the  most common cause. Viral infections. Pregnancy. Certain medicines. Birth defects. Problems with a gland in the center of the brain (pituitary gland). Lack of enough iodine in the diet. Other causes may include: Past radiation treatments to the head or neck for cancer. Past treatment with radioactive iodine. Past exposure to radiation in the environment. Past surgical removal of part or all of the thyroid. What increases the risk? You are more likely to develop this condition if: You are female. You have a family history of thyroid conditions. You use a medicine called lithium. You take medicines that affect the immune system (immunosuppressants). What are the signs or symptoms? Common symptoms of this condition include: Not being able to tolerate cold. Feeling as though you have no energy (lethargy). Lack of appetite. Constipation. Sadness or depression. Weight gain that is not explained by a change in diet or exercise habits. Menstrual irregularity. Dry skin, coarse hair, or brittle nails. Other symptoms may include: Muscle pain. Slowing of thought processes. Poor memory. How is this diagnosed? This condition may be diagnosed based on: Your symptoms, your medical history, and a physical exam. Blood tests. You may also have imaging tests, such as an ultrasound or MRI. How is this treated? This condition is treated with medicine that replaces the thyroid hormones that your body does not make. After you begin treatment, it may take several weeks for symptoms to go away. Follow these instructions at home: Take over-the-counter and prescription medicines only as told by your health care provider. If you start taking any new medicines, tell your health care  provider. Keep all follow-up visits as told by your health care provider. This is important. As your condition improves, your dosage of thyroid hormone medicine may change. You will need to have blood tests regularly so that  your health care provider can monitor your condition. Contact a health care provider if: Your symptoms do not get better with treatment. You are taking thyroid hormone replacement medicine and you: Sweat a lot. Have tremors. Feel anxious. Lose weight rapidly. Cannot tolerate heat. Have emotional swings. Have diarrhea. Feel weak. Get help right away if: You have chest pain. You have an irregular heartbeat. You have a rapid heartbeat. You have difficulty breathing. These symptoms may be an emergency. Get help right away. Call 911. Do not wait to see if the symptoms will go away. Do not drive yourself to the hospital. Summary Hypothyroidism is when the thyroid gland does not make enough of certain hormones (it is underactive). When the thyroid is underactive, it produces too little of the hormones thyroxine (T4) and triiodothyronine (T3). The most common cause is Hashimoto's disease, a disease in which the body's disease-fighting system (immune system) attacks the thyroid gland. The condition can also be caused by viral infections, medicine, pregnancy, or past radiation treatment to the head or neck. Symptoms may include weight gain, dry skin, constipation, feeling as though you do not have energy, and not being able to tolerate cold. This condition is treated with medicine to replace the thyroid hormones that your body does not make. This information is not intended to replace advice given to you by your health care provider. Make sure you discuss any questions you have with your health care provider. Document Revised: 06/09/2021 Document Reviewed: 06/09/2021 Elsevier Patient Education  2024 [employer].

## 2023-03-07 ENCOUNTER — Encounter: Payer: Self-pay | Admitting: Nurse Practitioner

## 2023-03-07 ENCOUNTER — Ambulatory Visit (INDEPENDENT_AMBULATORY_CARE_PROVIDER_SITE_OTHER): Payer: Medicare HMO | Admitting: Nurse Practitioner

## 2023-03-07 VITALS — BP 135/65 | HR 61 | Temp 97.5°F | Ht 64.0 in | Wt 155.0 lb

## 2023-03-07 DIAGNOSIS — F5104 Psychophysiologic insomnia: Secondary | ICD-10-CM | POA: Diagnosis not present

## 2023-03-07 DIAGNOSIS — E039 Hypothyroidism, unspecified: Secondary | ICD-10-CM

## 2023-03-07 DIAGNOSIS — G4733 Obstructive sleep apnea (adult) (pediatric): Secondary | ICD-10-CM

## 2023-03-07 DIAGNOSIS — M79605 Pain in left leg: Secondary | ICD-10-CM | POA: Diagnosis not present

## 2023-03-07 DIAGNOSIS — R7301 Impaired fasting glucose: Secondary | ICD-10-CM | POA: Diagnosis not present

## 2023-03-07 DIAGNOSIS — E782 Mixed hyperlipidemia: Secondary | ICD-10-CM | POA: Diagnosis not present

## 2023-03-07 DIAGNOSIS — F338 Other recurrent depressive disorders: Secondary | ICD-10-CM | POA: Diagnosis not present

## 2023-03-07 DIAGNOSIS — I700 Atherosclerosis of aorta: Secondary | ICD-10-CM | POA: Diagnosis not present

## 2023-03-07 NOTE — Assessment & Plan Note (Signed)
Chronic, ongoing.  Poor tolerance to Trazodone and Melatonin.  Would be cautious performing trial of Belsomra as concern for side effects, she is very sensitive to medications at baseline.  Continue dental device at night.  Discussed referral to therapy which may help with insomnia and mood -- cognitive behavioral therapy would be beneficial.  She is agreeable and referral was placed.

## 2023-03-07 NOTE — Assessment & Plan Note (Signed)
Has been working on diet and weight loss with benefit.  Recheck A1c today.

## 2023-03-07 NOTE — Assessment & Plan Note (Signed)
Chronic with elevation in LDL.  Discussed guidelines for statin therapy and reviewed her ASCVD score + current labs.  She refuses statin therapy.  Continue to monitor.  She is taking supplements at home.  Check labs today.

## 2023-03-07 NOTE — Assessment & Plan Note (Signed)
Chronic, stable.  Continue with current dental device.  Labs ordered today.

## 2023-03-07 NOTE — Assessment & Plan Note (Signed)
Chronic, stable.  Denies SI/HI.  Continue current medication regimen and adjust as needed.  Referral to therapy placed.

## 2023-03-07 NOTE — Assessment & Plan Note (Signed)
Chronic, noted on imaging 02/03/2020.  Discussed guidelines for statin therapy and reviewed her ASCVD score + current labs.  She refuses statin therapy.  Continue to monitor.

## 2023-03-07 NOTE — Assessment & Plan Note (Signed)
Chronic, stable.  Continue current medication regimen and adjust as needed.  Thyroid labs up to date.

## 2023-03-07 NOTE — Assessment & Plan Note (Signed)
Suspect this may be coming from lower back area.  Has been ongoing for months from posterior left gluteus to lateral left leg down to knee.  She is very sensitive to medications, tolerated Flexeril.  Recommend she try this for discomfort + Tylenol.  Educated on this.  Recommend use of foam roller daily and discussed online PT options to try for reduction of pain.  She will trial all of this and if ongoing pain consider imaging and a referral to PT.

## 2023-03-07 NOTE — Progress Notes (Signed)
BP 135/65   Pulse 61   Temp (!) 97.5 F (36.4 C) (Oral)   Ht 5\' 4"  (1.626 m)   Wt 155 lb (70.3 kg)   SpO2 100%   BMI 26.61 kg/m    Subjective:    Patient ID: Veronica Mills, female    DOB: [birth year], 67 y.o.   MRN: [id number]  HPI: Veronica Mills is a 67 y.o. female  Chief Complaint  Patient presents with   Depression   Hyperlipidemia   Insomnia   Hypothyroidism   Leg Pain    Patient states she has been having pain in her L leg, from her hip to her knee. States it has been going on for the last several months. States the pain wakes her up in the morning. States the area that is hurting has progressively gotten larger in the last few months.    HYPOTHYROIDISM Continues on [employer]our thyroid with benefit.  Thyroid control status:stable Satisfied with current treatment? yes Medication side effects: no Medication compliance: good compliance Etiology of hypothyroidism: unknown Recent dose adjustment:no Fatigue: continues to have ongoing fatigue Cold intolerance: no Heat intolerance: no Weight gain: no Weight loss: no Constipation: yes Diarrhea/loose stools: no Palpitations: no Lower extremity edema: no Anxiety/depressed mood: occasional anhedonia  HYPERLIPIDEMIA No current medications.  Aortic atherosclerosis noted on imaging 02/03/2020. Has taking plant sterols and fish oil.   Hyperlipidemia status: good compliance Supplements: none Aspirin:  no The 10-year ASCVD risk score (Arnett DK, et al., 2019) is: 10.8%   Values used to calculate the score:     Age: 62 years     Sex: Female     Is Non-Hispanic African American: No     Diabetic: Yes     Tobacco smoker: No     Systolic Blood Pressure: 135 mmHg     Is BP treated: No     HDL Cholesterol: 62 mg/dL     Total Cholesterol: 233 mg/dL Chest pain:  no Coronary artery disease:  no Family history CAD:  yes -- father in later life lacunar strokes Family history early CAD:  no  Lipid Panel     Component Value  Date/Time   CHOL 233 (H) 09/02/2022 0923   TRIG 96 09/02/2022 0923   HDL 62 09/02/2022 0923   CHOLHDL 3.2 03/08/2022 0842   LDLCALC 154 (H) 09/02/2022 0923   LABVLDL 17 09/02/2022 0923    PREDIABETES A1c 5.7% in March.   Polydipsia/polyuria: no Visual disturbance: no Chest pain: no Paresthesias: no  LEFT LEG PAIN Has been present to left leg for a few months. Starts up in hip area and radiates down IT band area to knee. Duration: months Involved leg: left  Mechanism of injury: unknown Location: lateral Onset: sudden  Severity: 10/10  Quality: sharp and aching Frequency: intermittent Radiation:  as above Aggravating factors:  sleeping on side -- notices it more at night    Alleviating factors: [facility] Pain relief cream  Status: fluctuating Treatments attempted: Meloxicam and cream   Relief with NSAIDs?: moderate Weakness with weight bearing: no Weakness with walking: no Paresthesias / decreased sensation: no Swelling: no Redness:no Fevers: no   DEPRESSION Continues on Wellbutrin daily. Has been on for several years.  Takes Magnesium at night, has for years.  Trazodone causes her to be awake and Melatonin causes vertigo. Uses dental device for sleep. Mood status: stable Satisfied with current treatment?: yes Symptom severity: moderate  Duration of current treatment : chronic Side effects: no Medication compliance:  good compliance Psychotherapy/counseling: yes in the past Previous psychiatric medications: Cymbalta Depressed mood: yes occasional Anxious mood: no Anhedonia:  when she is tired Significant weight loss or gain: no Insomnia: yes hard to fall asleep and stay asleep Fatigue: yes Feelings of worthlessness or guilt: no Impaired concentration/indecisiveness: sometimes Suicidal ideations: no Hopelessness: no Crying spells: no    03/07/2023    8:59 AM 12/13/2022    3:32 PM 09/02/2022    9:16 AM 04/13/2022    9:29 AM 03/08/2022    8:06 AM  Depression  screen PHQ 2/9  Decreased Interest 1 0 1 0 1  Down, Depressed, Hopeless 0 0 1 0 0  PHQ - 2 Score 1 0 2 0 1  Altered sleeping 3 0 3 2 2   Tired, decreased energy 3 0 3 1 2   Change in appetite 0 0 0 0 0  Feeling bad or failure about yourself  0 0 0 0 0  Trouble concentrating 1 0 0 1 0  Moving slowly or fidgety/restless 0 0 0 0 0  Suicidal thoughts 0 0 0 0 0  PHQ-9 Score 8 0 8 4 5   Difficult doing work/chores Somewhat difficult Not difficult at all Somewhat difficult Somewhat difficult Not difficult at all       03/07/2023    9:01 AM 09/02/2022    9:17 AM 04/13/2022    9:30 AM 03/08/2022    8:06 AM  GAD 7 : Generalized Anxiety Score  Nervous, Anxious, on Edge 0 0 0 0  Control/stop worrying 0 1 0 0  Worry too much - different things 0 1 0 0  Trouble relaxing 0 1 0 0  Restless 0 0 0 0  Easily annoyed or irritable 0 1 0 0  Afraid - awful might happen 0 0 0 0  Total GAD 7 Score 0 4 0 0  Anxiety Difficulty Not difficult at all Somewhat difficult  Not difficult at all   Relevant past medical, surgical, family and social history reviewed and updated as indicated. Interim medical history since our last visit reviewed. Allergies and medications reviewed and updated.  Review of Systems  Constitutional:  Positive for fatigue. Negative for activity change, appetite change, diaphoresis and fever.  Respiratory:  Negative for cough, chest tightness, shortness of breath and wheezing.   Cardiovascular:  Negative for chest pain, palpitations and leg swelling.  Endocrine: Negative for cold intolerance, heat intolerance, polydipsia, polyphagia and polyuria.  Musculoskeletal:  Positive for arthralgias.  Neurological: Negative.  Negative for syncope.  Psychiatric/Behavioral:  Positive for decreased concentration and sleep disturbance. Negative for self-injury and suicidal ideas. The patient is nervous/anxious.    Per HPI unless specifically indicated above     Objective:    BP 135/65   Pulse 61    Temp (!) 97.5 F (36.4 C) (Oral)   Ht 5\' 4"  (1.626 m)   Wt 155 lb (70.3 kg)   SpO2 100%   BMI 26.61 kg/m   Wt Readings from Last 3 Encounters:  03/07/23 155 lb (70.3 kg)  12/13/22 143 lb (64.9 kg)  09/02/22 143 lb (64.9 kg)    Physical Exam Vitals and nursing note reviewed.  Constitutional:      General: She is awake. She is not in acute distress.    Appearance: She is well-developed and well-groomed. She is not ill-appearing or toxic-appearing.  HENT:     Head: Normocephalic.     Right Ear: Hearing and external ear normal.     Left  Ear: Hearing and external ear normal.  Eyes:     General: Lids are normal.        Right eye: No discharge.        Left eye: No discharge.     Conjunctiva/sclera: Conjunctivae normal.     Pupils: Pupils are equal, round, and reactive to light.  Neck:     Thyroid: No thyromegaly.     Vascular: No carotid bruit.  Cardiovascular:     Rate and Rhythm: Normal rate and regular rhythm.     Heart sounds: Normal heart sounds. No murmur heard.    No gallop.  Pulmonary:     Effort: Pulmonary effort is normal. No accessory muscle usage or respiratory distress.     Breath sounds: Normal breath sounds.  Abdominal:     General: Bowel sounds are normal.     Palpations: Abdomen is soft. There is no hepatomegaly or splenomegaly.  Musculoskeletal:     Cervical back: Normal range of motion and neck supple.     Right hip: Normal.     Left hip: Tenderness (to posterior mid gluteus) present. No bony tenderness or crepitus. Normal range of motion. Normal strength.     Right lower leg: No edema.     Left lower leg: No edema.     Comments: Some tenderness along ITB area left side.  Lymphadenopathy:     Cervical: No cervical adenopathy.  Skin:    General: Skin is warm and dry.  Neurological:     Mental Status: She is alert and oriented to person, place, and time.     Deep Tendon Reflexes: Reflexes are normal and symmetric.  Psychiatric:        Attention and  Perception: Attention normal.        Mood and Affect: Mood normal.        Speech: Speech normal.        Behavior: Behavior normal. Behavior is cooperative.        Thought Content: Thought content normal.    Results for orders placed or performed in visit on 09/02/22  T4, free  Result Value Ref Range   Free T4 0.90 0.82 - 1.77 ng/dL  CBC with Differential/Platelet  Result Value Ref Range   WBC 5.5 3.4 - 10.8 x10E3/uL   RBC 4.62 3.77 - 5.28 x10E6/uL   Hemoglobin 14.5 11.1 - 15.9 g/dL   Hematocrit 81.1 91.4 - 46.6 %   MCV 92 79 - 97 fL   MCH 31.4 26.6 - 33.0 pg   MCHC 34.0 31.5 - 35.7 g/dL   RDW 78.2 95.6 - 21.3 %   Platelets 261 150 - 450 x10E3/uL   Neutrophils 53 Not Estab. %   Lymphs 36 Not Estab. %   Monocytes 6 Not Estab. %   Eos 4 Not Estab. %   Basos 1 Not Estab. %   Neutrophils Absolute 2.9 1.4 - 7.0 x10E3/uL   Lymphocytes Absolute 2.0 0.7 - 3.1 x10E3/uL   Monocytes Absolute 0.3 0.1 - 0.9 x10E3/uL   EOS (ABSOLUTE) 0.2 0.0 - 0.4 x10E3/uL   Basophils Absolute 0.0 0.0 - 0.2 x10E3/uL   Immature Granulocytes 0 Not Estab. %   Immature Grans (Abs) 0.0 0.0 - 0.1 x10E3/uL  Comprehensive metabolic panel  Result Value Ref Range   Glucose 90 70 - 99 mg/dL   BUN 18 8 - 27 mg/dL   Creatinine, Ser 0.86 0.57 - 1.00 mg/dL   eGFR 98 >57 QI/ONG/2.95   BUN/Creatinine Ratio 27  12 - 28   Sodium 140 134 - 144 mmol/L   Potassium 4.1 3.5 - 5.2 mmol/L   Chloride 101 96 - 106 mmol/L   CO2 25 20 - 29 mmol/L   Calcium 9.6 8.7 - 10.3 mg/dL   Total Protein 6.7 6.0 - 8.5 g/dL   Albumin 4.3 3.9 - 4.9 g/dL   Globulin, Total 2.4 1.5 - 4.5 g/dL   Albumin/Globulin Ratio 1.8 1.2 - 2.2   Bilirubin Total 0.4 0.0 - 1.2 mg/dL   Alkaline Phosphatase 78 44 - 121 IU/L   AST 17 0 - 40 IU/L   ALT 20 0 - 32 IU/L  Lipid Panel w/o Chol/HDL Ratio  Result Value Ref Range   Cholesterol, Total 233 (H) 100 - 199 mg/dL   Triglycerides 96 0 - 149 mg/dL   HDL 62 >38 mg/dL   VLDL Cholesterol Cal 17 5 - 40 mg/dL    LDL Chol Calc (NIH) 154 (H) 0 - 99 mg/dL  TSH  Result Value Ref Range   TSH 1.870 0.450 - 4.500 uIU/mL  HgB A1c  Result Value Ref Range   Hgb A1c MFr Bld 5.7 (H) 4.8 - 5.6 %   Est. average glucose Bld gHb Est-mCnc 117 mg/dL      Assessment & Plan:   Problem List Items Addressed This Visit       Cardiovascular and Mediastinum   Aortic atherosclerosis (HCC)    Chronic, noted on imaging 02/03/2020.  Discussed guidelines for statin therapy and reviewed her ASCVD score + current labs.  She refuses statin therapy.  Continue to monitor.        Respiratory   OSA (obstructive sleep apnea)    Chronic, stable.  Continue with current dental device.  Labs ordered today.          Endocrine   Hypothyroid    Chronic, stable.  Continue current medication regimen and adjust as needed.  Thyroid labs up to date.      IFG (impaired fasting glucose)    Has been working on diet and weight loss with benefit.  Recheck A1c today.      Relevant Orders   HgB A1c     Other   Chronic insomnia (Chronic)    Chronic, ongoing.  Poor tolerance to Trazodone and Melatonin.  Would be cautious performing trial of Belsomra as concern for side effects, she is very sensitive to medications at baseline.  Continue dental device at night.  Discussed referral to therapy which may help with insomnia and mood -- cognitive behavioral therapy would be beneficial.  She is agreeable and referral was placed.      Relevant Orders   Ambulatory referral to Psychology   Hyperlipemia    Chronic with elevation in LDL.  Discussed guidelines for statin therapy and reviewed her ASCVD score + current labs.  She refuses statin therapy.  Continue to monitor.  She is taking supplements at home.  Check labs today.      Relevant Orders   Comprehensive metabolic panel   Lipid Panel w/o Chol/HDL Ratio   Left leg pain    Suspect this may be coming from lower back area.  Has been ongoing for months from posterior left gluteus to  lateral left leg down to knee.  She is very sensitive to medications, tolerated Flexeril.  Recommend she try this for discomfort + Tylenol.  Educated on this.  Recommend use of foam roller daily and discussed online PT options to try for reduction  of pain.  She will trial all of this and if ongoing pain consider imaging and a referral to PT.      Seasonal affective disorder (HCC) - Primary    Chronic, stable.  Denies SI/HI.  Continue current medication regimen and adjust as needed.  Referral to therapy placed.      Relevant Orders   Ambulatory referral to Psychology      Follow up plan: Return in about 6 months (around 09/04/2023) for Annual Physical.

## 2023-03-08 LAB — COMPREHENSIVE METABOLIC PANEL
ALT: 15 IU/L (ref 0–32)
AST: 15 IU/L (ref 0–40)
Albumin: 4.6 g/dL (ref 3.9–4.9)
Alkaline Phosphatase: 74 IU/L (ref 44–121)
BUN/Creatinine Ratio: 30 — ABNORMAL HIGH (ref 12–28)
BUN: 21 mg/dL (ref 8–27)
Bilirubin Total: 0.3 mg/dL (ref 0.0–1.2)
CO2: 24 mmol/L (ref 20–29)
Calcium: 10.3 mg/dL (ref 8.7–10.3)
Chloride: 100 mmol/L (ref 96–106)
Creatinine, Ser: 0.69 mg/dL (ref 0.57–1.00)
Globulin, Total: 2.4 g/dL (ref 1.5–4.5)
Glucose: 103 mg/dL — ABNORMAL HIGH (ref 70–99)
Potassium: 4.4 mmol/L (ref 3.5–5.2)
Sodium: 141 mmol/L (ref 134–144)
Total Protein: 7 g/dL (ref 6.0–8.5)
eGFR: 97 mL/min/{1.73_m2} (ref >59)

## 2023-03-08 LAB — LIPID PANEL W/O CHOL/HDL RATIO
Cholesterol, Total: 222 mg/dL — ABNORMAL HIGH (ref 100–199)
HDL: 71 mg/dL (ref >39)
LDL Chol Calc (NIH): 139 mg/dL — ABNORMAL HIGH (ref 0–99)
Triglycerides: 71 mg/dL (ref 0–149)
VLDL Cholesterol Cal: 12 mg/dL (ref 5–40)

## 2023-03-08 LAB — HEMOGLOBIN A1C
Est. average glucose Bld gHb Est-mCnc: 120 mg/dL
Hgb A1c MFr Bld: 5.8 % — ABNORMAL HIGH (ref 4.8–5.6)

## 2023-03-08 NOTE — Progress Notes (Signed)
Contacted via MyChart   Good afternoon Veronica Mills, your labs have returned: - Kidney function, creatinine and eGFR, remains normal, as is liver function, AST and ALT.  - A1c remains in prediabetic range at 5.8%, continue focus on diet and regular activity. - Cholesterol levels remain elevated, I know you prefer no medication for this -- continue supplements as they have trended down some with these.  Any questions? Keep being amazing!!  Thank you for allowing me to participate in your care.  I appreciate you. Kindest regards, Ira Dougher

## 2023-03-10 ENCOUNTER — Encounter: Payer: Self-pay | Admitting: Nurse Practitioner

## 2023-04-08 ENCOUNTER — Other Ambulatory Visit: Payer: Self-pay | Admitting: Nurse Practitioner

## 2023-04-08 DIAGNOSIS — M25522 Pain in left elbow: Secondary | ICD-10-CM

## 2023-04-08 DIAGNOSIS — H40053 Ocular hypertension, bilateral: Secondary | ICD-10-CM | POA: Diagnosis not present

## 2023-04-08 DIAGNOSIS — H43813 Vitreous degeneration, bilateral: Secondary | ICD-10-CM | POA: Diagnosis not present

## 2023-04-08 DIAGNOSIS — E119 Type 2 diabetes mellitus without complications: Secondary | ICD-10-CM | POA: Diagnosis not present

## 2023-04-11 ENCOUNTER — Other Ambulatory Visit: Payer: Self-pay | Admitting: Nurse Practitioner

## 2023-04-11 DIAGNOSIS — M25522 Pain in left elbow: Secondary | ICD-10-CM

## 2023-04-11 DIAGNOSIS — Z0100 Encounter for examination of eyes and vision without abnormal findings: Secondary | ICD-10-CM | POA: Diagnosis not present

## 2023-04-11 NOTE — Telephone Encounter (Signed)
Medication Refill - Medication: meloxicam (MOBIC) 15 MG tablet   Has the patient contacted their pharmacy? Yes.   Pharmacy advised her to contact provider  Preferred Pharmacy (with phone number or street name):  [id number] [place], [place] - [address] AT [facility] OF [address] [facility] Phone: [phone]  Fax: [phone]     Has the patient been seen for an appointment in the last year OR does the patient have an upcoming appointment? Yes.    Agent: Please be advised that RX refills may take up to 3 business days. We ask that you follow-up with your pharmacy.  Patient is requesting a 90 day supply

## 2023-04-12 MED ORDER — MELOXICAM 15 MG PO TABS
15.0000 mg | ORAL_TABLET | Freq: Every day | ORAL | 2 refills | Status: DC
Start: 2023-04-12 — End: 2023-08-23

## 2023-04-12 NOTE — Telephone Encounter (Signed)
Requested medication (s) are due for refill today: yes  Requested medication (s) are on the active medication list: yes  Last refill:  01/12/23  Future visit scheduled: yes  Notes to clinic:  Unable to refill per protocol, last refill by another provider.      Requested Prescriptions  Pending Prescriptions Disp Refills   meloxicam (MOBIC) 15 MG tablet 30 tablet 2    Sig: Take 1 tablet (15 mg total) by mouth daily.     Analgesics:  COX2 Inhibitors Failed - 04/11/2023  9:13 AM      Failed - Manual Review: Labs are only required if the patient has taken medication for more than 8 weeks.      Passed - HGB in normal range and within 360 days    Hemoglobin  Date Value Ref Range Status  09/02/2022 14.5 11.1 - 15.9 g/dL Final         Passed - Cr in normal range and within 360 days    Creatinine, Ser  Date Value Ref Range Status  03/07/2023 0.69 0.57 - 1.00 mg/dL Final         Passed - HCT in normal range and within 360 days    Hematocrit  Date Value Ref Range Status  09/02/2022 42.7 34.0 - 46.6 % Final         Passed - AST in normal range and within 360 days    AST  Date Value Ref Range Status  03/07/2023 15 0 - 40 IU/L Final         Passed - ALT in normal range and within 360 days    ALT  Date Value Ref Range Status  03/07/2023 15 0 - 32 IU/L Final         Passed - eGFR is 30 or above and within 360 days    GFR calc Af Amer  Date Value Ref Range Status  04/17/2020 108 >59 mL/min/1.73 Final    Comment:    **In accordance with recommendations from the NKF-ASN Task force,**   [facility] is in the process of updating its eGFR calculation to the   2021 CKD-EPI creatinine equation that estimates kidney function   without a race variable.    GFR calc non Af Amer  Date Value Ref Range Status  04/17/2020 94 >59 mL/min/1.73 Final   eGFR  Date Value Ref Range Status  03/07/2023 97 >59 mL/min/1.73 Final         Passed - Patient is not pregnant      Passed - Valid  encounter within last 12 months    Recent Outpatient Visits           1 month ago Seasonal affective disorder (HCC)   [place] [facility] [place], Corrie Dandy T, NP   7 months ago Seasonal affective disorder (HCC)   [place] [facility] [place], Corrie Dandy T, NP   12 months ago Acute cystitis with hematuria   [place] [facility] [place], Oralia Rud, DO   1 year ago Annual physical exam   [place] [facility] Larae Grooms, NP   1 year ago COVID-19   [place] [facility] [place], Oswaldo Conroy, PA-C       Future Appointments             In 5 months Cannady, Dorie Rank, NP [place] [facility]

## 2023-05-02 ENCOUNTER — Other Ambulatory Visit: Payer: Self-pay | Admitting: Nurse Practitioner

## 2023-05-02 ENCOUNTER — Encounter: Payer: Self-pay | Admitting: Nurse Practitioner

## 2023-05-02 MED ORDER — ONETOUCH ULTRA VI STRP: ORAL_STRIP | 3 refills | Status: DC

## 2023-05-02 MED ORDER — ARMOUR THYROID 30 MG PO TABS: 30.0000 mg | ORAL_TABLET | Freq: Every day | ORAL | 4 refills | Status: DC

## 2023-05-03 NOTE — Telephone Encounter (Signed)
Requested Prescriptions  Refused Prescriptions Disp Refills   ONETOUCH ULTRA test strip [Pharmacy Med Name: ONE TOUCH ULTRA BLUE TESTST(NEW)100] 100 strip 0    Sig: USE AS DIRECTED EVERY DAY     Endocrinology: Diabetes - Testing Supplies Passed - 05/02/2023  9:06 AM      Passed - Valid encounter within last 12 months    Recent Outpatient Visits           1 month ago Seasonal affective disorder (HCC)   [place] [facility] [place], Corrie Dandy T, NP   8 months ago Seasonal affective disorder (HCC)   [place] [facility] [place], Corrie Dandy T, NP   1 year ago Acute cystitis with hematuria   [facility] [place], Megan P, DO   1 year ago Annual physical exam   [facility] Larae Grooms, NP   1 year ago COVID-19   [place] [facility] [place], Oswaldo Conroy, PA-C       Future Appointments             In 4 months Cannady, Dorie Rank, NP [facility], [facility]

## 2023-06-02 ENCOUNTER — Encounter: Payer: Self-pay | Admitting: Pediatrics

## 2023-06-02 ENCOUNTER — Ambulatory Visit (INDEPENDENT_AMBULATORY_CARE_PROVIDER_SITE_OTHER): Payer: Medicare HMO | Admitting: Pediatrics

## 2023-06-02 VITALS — BP 112/73 | HR 59 | Temp 98.6°F | Resp 15 | Wt 157.4 lb

## 2023-06-02 DIAGNOSIS — Z1330 Encounter for screening examination for mental health and behavioral disorders, unspecified: Secondary | ICD-10-CM

## 2023-06-02 DIAGNOSIS — M67969 Unspecified disorder of synovium and tendon, unspecified lower leg: Secondary | ICD-10-CM | POA: Diagnosis not present

## 2023-06-02 MED ORDER — CELECOXIB 100 MG PO CAPS
100.0000 mg | ORAL_CAPSULE | Freq: Two times a day (BID) | ORAL | 0 refills | Status: AC
Start: 2023-06-02 — End: 2023-07-02

## 2023-06-02 NOTE — Patient Instructions (Addendum)
YOUR PLAN:  -KNEE PAIN: Knee pain can result from injuries or conditions affecting the knee joint. Your pain started after slipping on ice and is being managed with Meloxicam, but we are adding Celebrex for stronger relief and recommending the use of Voltaren gel. You should also start physical therapy exercises and check in one week for improvement. -- if not helping, we can increase meloxicam to 30mg  instead; message [place] if that is your preference --pick up voltaren gel (diclofenac gel) -- while taking celebrex, hold meloxicam  Continue icing and resting in between exercises. I attache two separate rehab phases. Listen to your body as you do these exercises. They should not be painful. I recommend resting for 7-10 days before initiating these.  If you experience any worsening symptoms or have any concerns, feel free to contact us.

## 2023-06-02 NOTE — Progress Notes (Signed)
Office Visit  BP 112/73 (BP Location: Left Arm, Patient Position: Sitting, Cuff Size: Normal)   Pulse (!) 59   Temp 98.6 F (37 C) (Oral)   Resp 15   Wt 157 lb 6.4 oz (71.4 kg)   SpO2 99%   BMI 27.02 kg/m    Subjective:    Patient ID: Veronica Mills, female    DOB: Feb 08, 1956, 67 y.o.   MRN: [id number]  HPI: [name] is a 66 y.o. female  Chief Complaint  Patient presents with   Knee Pain    Right. Sat slipped on ice. At times very sharp pain on lateral side but not with every step or movement. No ice, heat. Using Meloxicam   Discussed the use of AI scribe software for clinical note transcription with the patient, who gave verbal consent to proceed.  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 The pain, located in the lateral aspect of the knee, began after a slip on ice. The pain is described as a sudden, sharp sensation that occurs intermittently during ambulation and turning movements, almost causing the patient to fall. The patient also reports a history of knee instability, with the sensation of something 'popping out' when kneeling and bearing weight on the affected knee.  The patient has been self-managing the knee pain with Meloxicam, which has provided minimal relief. The patient reports a slight improvement in pain over the past day. There is a history of a similar knee issue, where the patient experienced a popping sensation when kneeling and bearing weight on the knee.  The patient also reports a history of chronic elbow pain, managed with Meloxicam. The patient has been taking Meloxicam 15mg  daily for this condition, which has been effective in managing the elbow pain.  The patient has a planned trip to [place] in the near future for a family event, which may limit the ability to engage in physical therapy for the knee pain. The patient also reports a history of  adverse reaction to steroid injections, which resulted in severe pain and inability to drive.      Relevant past medical, surgical, family and social history reviewed and updated as indicated. Interim medical history since our last visit reviewed. Allergies and medications reviewed and updated.  ROS per HPI unless specifically indicated above     Objective:    BP 112/73 (BP Location: Left Arm, Patient Position: Sitting, Cuff Size: Normal)   Pulse (!) 59   Temp 98.6 F (37 C) (Oral)   Resp 15   Wt 157 lb 6.4 oz (71.4 kg)   SpO2 99%   BMI 27.02 kg/m   Wt Readings from Last 3 Encounters:  06/02/23 157 lb 6.4 oz (71.4 kg)  03/07/23 155 lb (70.3 kg)  12/13/22 143 lb (64.9 kg)     Physical Exam Constitutional:      Appearance: Normal appearance.  HENT:     Head: Normocephalic and atraumatic.  Eyes:     Pupils: Pupils are equal, round, and reactive to light.  Musculoskeletal:        General: Normal range of motion.     Right knee: Swelling present. No effusion or erythema. Tenderness present over the LCL and PCL. No LCL laxity, MCL laxity, ACL laxity or PCL laxity. Normal alignment and normal patellar mobility. Normal pulse.     Instability Tests: Anterior drawer test negative. Posterior  drawer test negative. Anterior Lachman test negative. Medial McMurray test negative and lateral McMurray test negative.     Left knee: Normal. No swelling, deformity, effusion or erythema. Normal range of motion. No tenderness.     Comments: Mild pain with Lateral Mcmurray and posterior draware. No laxity and pain not severe.  Skin:    General: Skin is warm and dry.     Capillary Refill: Capillary refill takes less than 2 seconds.  Neurological:     General: No focal deficit present.     Mental Status: She is alert. Mental status is at baseline.     Gait: Gait is intact. Gait normal.  Psychiatric:        Mood and Affect: Mood normal.        Behavior: Behavior normal.         06/02/2023     2:45 PM 03/07/2023    8:59 AM 12/13/2022    3:32 PM 09/02/2022    9:16 AM 04/13/2022    9:29 AM  Depression screen PHQ 2/9  Decreased Interest 1 1 0 1 0  Down, Depressed, Hopeless 1 0 0 1 0  PHQ - 2 Score 2 1 0 2 0  Altered sleeping 2 3 0 3 2  Tired, decreased energy 2 3 0 3 1  Change in appetite 0 0 0 0 0  Feeling bad or failure about yourself  0 0 0 0 0  Trouble concentrating 1 1 0 0 1  Moving slowly or fidgety/restless 0 0 0 0 0  Suicidal thoughts 0 0 0 0 0  PHQ-9 Score 7 8 0 8 4  Difficult doing work/chores Somewhat difficult Somewhat difficult Not difficult at all Somewhat difficult Somewhat difficult       06/02/2023    2:45 PM 03/07/2023    9:01 AM 09/02/2022    9:17 AM 04/13/2022    9:30 AM  GAD 7 : Generalized Anxiety Score  Nervous, Anxious, on Edge 0 0 0 0  Control/stop worrying 0 0 1 0  Worry too much - different things 0 0 1 0  Trouble relaxing 1 0 1 0  Restless 0 0 0 0  Easily annoyed or irritable 1 0 1 0  Afraid - awful might happen 1 0 0 0  Total GAD 7 Score 3 0 4 0  Anxiety Difficulty Not difficult at all Not difficult at all Somewhat difficult       Assessment & Plan:  Assessment & Plan   Tendinopathy of knee Acute onset of knee pain following a slip on ice. Noted tenderness and swelling on examination. No instability or locking noted. Suspect either tendinopathy of knee vs mild sprain of LCL joint. Will hold off on imaging for now and proceed if not improving with initial rest and exercises. Declines steroid infections. Currently on Meloxicam 15mg  daily for elbow pain, which has provided some relief for the knee pain. Will switch to celebrex and at home exercises. -Continue Meloxicam 15mg  daily. -Start Celebrex as a stronger alternative to Meloxicam. -Apply over-the-counter diclofenac gel (Voltaren gel) for local anti-inflammatory effect. -Initiate physical therapy exercises as provided. -Check in one week to assess improvement via mychart -      Celecoxib; Take 1 capsule (100 mg total) by mouth 2 (two) times daily.  Dispense: 60 capsule; Refill: 0  Encounter for behavioral health screening As part of their intake evaluation, the patient was screened for depression, anxiety.  PHQ9 SCORE 7, GAD7 SCORE 3. Screening results  negative for tested conditions. Continue to monitor.   Follow up plan: Return if symptoms worsen or fail to improve.  Niaya Hickok Howell Pringle, MD  Approximately 30 minutes spent on patient encounter today including assessment, counseling, diagnosing, treatment plan development, and charting.

## 2023-06-03 ENCOUNTER — Encounter: Payer: Self-pay | Admitting: Pediatrics

## 2023-08-23 ENCOUNTER — Other Ambulatory Visit: Payer: Self-pay | Admitting: Nurse Practitioner

## 2023-08-23 DIAGNOSIS — M25522 Pain in left elbow: Secondary | ICD-10-CM

## 2023-08-23 NOTE — Telephone Encounter (Signed)
 Copied from CRM [id number]. Topic: Clinical - Medication Refill >> Aug 23, 2023  4:19 PM Higinio Roger wrote: Most Recent Primary Care Visit:  Provider: Jackolyn Confer  Department: [id number]  Visit Type: OFFICE VISIT  Date: 06/02/2023  Medication: meloxicam (MOBIC) 15 MG tablet  Has the patient contacted their pharmacy? No (Agent: If no, request that the patient contact the pharmacy for the refill. If patient does not wish to contact the pharmacy document the reason why and proceed with request.) Patient is out of the area and needed a refill from the pharmacy close to her   (Agent: If yes, when and what did the pharmacy advise?)  Is this the correct pharmacy for this prescription? Yes If no, delete pharmacy and type the correct one.  This is the patient's preferred pharmacy:   [id number] - [place], [place] - [address] AT [facility] [address] [place] [address] Phone: [phone] Fax: [phone]   Has the prescription been filled recently? Yes  Is the patient out of the medication? Yes  Has the patient been seen for an appointment in the last year OR does the patient have an upcoming appointment? Yes  Can we respond through MyChart? Yes  Agent: Please be advised that Rx refills may take up to 3 business days. We ask that you follow-up with your pharmacy.

## 2023-08-24 MED ORDER — MELOXICAM 15 MG PO TABS: 15.0000 mg | ORAL_TABLET | Freq: Every day | ORAL | 0 refills | Status: AC

## 2023-08-24 NOTE — Telephone Encounter (Signed)
 Requested Prescriptions  Pending Prescriptions Disp Refills   meloxicam (MOBIC) 15 MG tablet 90 tablet 0    Sig: Take 1 tablet (15 mg total) by mouth daily.     Analgesics:  COX2 Inhibitors Failed - 08/24/2023  1:45 PM      Failed - Manual Review: Labs are only required if the patient has taken medication for more than 8 weeks.      Passed - HGB in normal range and within 360 days    Hemoglobin  Date Value Ref Range Status  09/02/2022 14.5 11.1 - 15.9 g/dL Final         Passed - Cr in normal range and within 360 days    Creatinine, Ser  Date Value Ref Range Status  03/07/2023 0.69 0.57 - 1.00 mg/dL Final         Passed - HCT in normal range and within 360 days    Hematocrit  Date Value Ref Range Status  09/02/2022 42.7 34.0 - 46.6 % Final         Passed - AST in normal range and within 360 days    AST  Date Value Ref Range Status  03/07/2023 15 0 - 40 IU/L Final         Passed - ALT in normal range and within 360 days    ALT  Date Value Ref Range Status  03/07/2023 15 0 - 32 IU/L Final         Passed - eGFR is 30 or above and within 360 days    GFR calc Af Amer  Date Value Ref Range Status  04/17/2020 108 >59 mL/min/1.73 Final    Comment:    **In accordance with recommendations from the NKF-ASN Task force,**   [facility] is in the process of updating its eGFR calculation to the   2021 CKD-EPI creatinine equation that estimates kidney function   without a race variable.    GFR calc non Af Amer  Date Value Ref Range Status  04/17/2020 94 >59 mL/min/1.73 Final   eGFR  Date Value Ref Range Status  03/07/2023 97 >59 mL/min/1.73 Final         Passed - Patient is not pregnant      Passed - Valid encounter within last 12 months    Recent Outpatient Visits           2 months ago Tendinopathy of knee   [facility] Jackolyn Confer, MD   5 months ago Seasonal affective disorder [facility])   [place] [facility] [place], Corrie Dandy T,  NP   11 months ago Seasonal affective disorder [facility])   [place] [facility] [place], Corrie Dandy T, NP   1 year ago Acute cystitis with hematuria   [place] [facility] [place], Oralia Rud, DO   1 year ago Annual physical exam   [place] [facility] Larae Grooms, NP       Future Appointments             In 1 month Cannady, Dorie Rank, NP [place] [facility], [facility]

## 2023-09-12 ENCOUNTER — Encounter: Payer: Self-pay | Admitting: Nurse Practitioner

## 2023-10-09 NOTE — Patient Instructions (Incomplete)
 Be Involved in Caring For Your Health:  Taking Medications When medications are taken as directed, they can greatly improve your health. But if they are not taken as prescribed, they may not work. In some cases, not taking them correctly can be harmful. To help ensure your treatment remains effective and safe, understand your medications and how to take them. Bring your medications to each visit for review by your provider.  Your lab results, notes, and after visit summary will be available on My Chart. We strongly encourage you to use this feature. If lab results are abnormal the clinic will contact you with the appropriate steps. If the clinic does not contact you assume the results are satisfactory. You can always view your results on My Chart. If you have questions regarding your health or results, please contact the clinic during office hours. You can also ask questions on My Chart.  We at [facility] are grateful that you chose [place] to provide your care. We strive to provide evidence-based and compassionate care and are always looking for feedback. If you get a survey from the clinic please complete this so we can hear your opinions.  Healthy Eating, Adult Healthy eating may help you get and keep a healthy body weight, reduce the risk of chronic disease, and live a long and productive life. It is important to follow a healthy eating pattern. Your nutritional and calorie needs should be met mainly by different nutrient-rich foods. What are tips for following this plan? Reading food labels Read labels and choose the following: Reduced or low sodium products. Juices with 100% fruit juice. Foods with low saturated fats (<3 g per serving) and high polyunsaturated and monounsaturated fats. Foods with whole grains, such as whole wheat, cracked wheat, brown rice, and wild rice. Whole grains that are fortified with folic acid. This is recommended for females who are pregnant or who want  to become pregnant. Read labels and do not eat or drink the following: Foods or drinks with added sugars. These include foods that contain brown sugar, corn sweetener, corn syrup, dextrose, fructose, glucose, high-fructose corn syrup, honey, invert sugar, lactose, malt syrup, maltose, molasses, raw sugar, sucrose, trehalose, or turbinado sugar. Limit your intake of added sugars to less than 10% of your total daily calories. Do not eat more than the following amounts of added sugar per day: 6 teaspoons (25 g) for females. 9 teaspoons (38 g) for males. Foods that contain processed or refined starches and grains. Refined grain products, such as white flour, degermed cornmeal, white bread, and white rice. Shopping Choose nutrient-rich snacks, such as vegetables, whole fruits, and nuts. Avoid high-calorie and high-sugar snacks, such as potato chips, fruit snacks, and candy. Use oil-based dressings and spreads on foods instead of solid fats such as butter, margarine, sour cream, or cream cheese. Limit pre-made sauces, mixes, and "instant" products such as flavored rice, instant noodles, and ready-made pasta. Try more plant-protein sources, such as tofu, tempeh, black beans, edamame, lentils, nuts, and seeds. Explore eating plans such as the Mediterranean diet or vegetarian diet. Try heart-healthy dips made with beans and healthy fats like hummus and guacamole. Vegetables go great with these. Cooking Use oil to saut or stir-fry foods instead of solid fats such as butter, margarine, or lard. Try baking, boiling, grilling, or broiling instead of frying. Remove the fatty part of meats before cooking. Steam vegetables in water or broth. Meal planning  At meals, imagine dividing your plate into fourths: One-half of  your plate is fruits and vegetables. One-fourth of your plate is whole grains. One-fourth of your plate is protein, especially lean meats, poultry, eggs, tofu, beans, or nuts. Include  low-fat dairy as part of your daily diet. Lifestyle Choose healthy options in all settings, including home, work, school, restaurants, or stores. Prepare your food safely: Wash your hands after handling raw meats. Where you prepare food, keep surfaces clean by regularly washing with hot, soapy water. Keep raw meats separate from ready-to-eat foods, such as fruits and vegetables. Cook seafood, meat, poultry, and eggs to the recommended temperature. Get a food thermometer. Store foods at safe temperatures. In general: Keep cold foods at 76F (4.4C) or below. Keep hot foods at 176F (60C) or above. Keep your freezer at [facility] (-17.8C) or below. Foods are not safe to eat if they have been between the temperatures of 40-176F (4.4-60C) for more than 2 hours. What foods should I eat? Fruits Aim to eat 1-2 cups of fresh, canned (in natural juice), or frozen fruits each day. One cup of fruit equals 1 small apple, 1 large banana, 8 large strawberries, 1 cup (237 g) canned fruit,  cup (82 g) dried fruit, or 1 cup (240 mL) 100% juice. Vegetables Aim to eat 2-4 cups of fresh and frozen vegetables each day, including different varieties and colors. One cup of vegetables equals 1 cup (91 g) broccoli or cauliflower florets, 2 medium carrots, 2 cups (150 g) raw, leafy greens, 1 large tomato, 1 large bell pepper, 1 large sweet potato, or 1 medium white potato. Grains Aim to eat 5-10 ounce-equivalents of whole grains each day. Examples of 1 ounce-equivalent of grains include 1 slice of bread, 1 cup (40 g) ready-to-eat cereal, 3 cups (24 g) popcorn, or  cup (93 g) cooked rice. Meats and other proteins Try to eat 5-7 ounce-equivalents of protein each day. Examples of 1 ounce-equivalent of protein include 1 egg,  oz nuts (12 almonds, 24 pistachios, or 7 walnut halves), 1/4 cup (90 g) cooked beans, 6 tablespoons (90 g) hummus or 1 tablespoon (16 g) peanut butter. A cut of meat or fish that is the size of a deck  of cards is about 3-4 ounce-equivalents (85 g). Of the protein you eat each week, try to have at least 8 sounce (227 g) of seafood. This is about 2 servings per week. This includes salmon, trout, herring, sardines, and anchovies. Dairy Aim to eat 3 cup-equivalents of fat-free or low-fat dairy each day. Examples of 1 cup-equivalent of dairy include 1 cup (240 mL) milk, 8 ounces (250 g) yogurt, 1 ounces (44 g) natural cheese, or 1 cup (240 mL) fortified soy milk. Fats and oils Aim for about 5 teaspoons (21 g) of fats and oils per day. Choose monounsaturated fats, such as canola and olive oils, mayonnaise made with olive oil or avocado oil, avocados, peanut butter, and most nuts, or polyunsaturated fats, such as sunflower, corn, and soybean oils, walnuts, pine nuts, sesame seeds, sunflower seeds, and flaxseed. Beverages Aim for 6 eight-ounce glasses of water per day. Limit coffee to 3-5 eight-ounce cups per day. Limit caffeinated beverages that have added calories, such as soda and energy drinks. If you drink alcohol: Limit how much you have to: 0-1 drink a day if you are female. 0-2 drinks a day if you are female. Know how much alcohol is in your drink. In the [place] one drink is one 12 oz bottle of beer (355 mL), one 5 oz glass of wine (  148 mL), or one 1 oz glass of hard liquor (44 mL). Seasoning and other foods Try not to add too much salt to your food. Try using herbs and spices instead of salt. Try not to add sugar to food. This information is based on [place] nutrition guidelines. To learn more, visit DisposableNylon.be. Exact amounts may vary. You may need different amounts. This information is not intended to replace advice given to you by your health care provider. Make sure you discuss any questions you have with your health care provider. Document Revised: 03/08/2022 Document Reviewed: 03/08/2022 Elsevier Patient Education  2024 [employer].

## 2023-10-10 DIAGNOSIS — H43813 Vitreous degeneration, bilateral: Secondary | ICD-10-CM | POA: Diagnosis not present

## 2023-10-10 DIAGNOSIS — H40053 Ocular hypertension, bilateral: Secondary | ICD-10-CM | POA: Diagnosis not present

## 2023-10-10 DIAGNOSIS — E119 Type 2 diabetes mellitus without complications: Secondary | ICD-10-CM | POA: Diagnosis not present

## 2023-10-10 DIAGNOSIS — H04123 Dry eye syndrome of bilateral lacrimal glands: Secondary | ICD-10-CM | POA: Diagnosis not present

## 2023-10-14 ENCOUNTER — Encounter: Payer: Self-pay | Admitting: Nurse Practitioner

## 2023-10-14 ENCOUNTER — Ambulatory Visit (INDEPENDENT_AMBULATORY_CARE_PROVIDER_SITE_OTHER): Payer: Medicare HMO | Admitting: Nurse Practitioner

## 2023-10-14 VITALS — BP 130/70 | HR 58 | Temp 98.4°F | Ht 64.1 in | Wt 168.4 lb

## 2023-10-14 DIAGNOSIS — Z8619 Personal history of other infectious and parasitic diseases: Secondary | ICD-10-CM | POA: Diagnosis not present

## 2023-10-14 DIAGNOSIS — E785 Hyperlipidemia, unspecified: Secondary | ICD-10-CM

## 2023-10-14 DIAGNOSIS — G4733 Obstructive sleep apnea (adult) (pediatric): Secondary | ICD-10-CM | POA: Diagnosis not present

## 2023-10-14 DIAGNOSIS — F5104 Psychophysiologic insomnia: Secondary | ICD-10-CM | POA: Diagnosis not present

## 2023-10-14 DIAGNOSIS — F338 Other recurrent depressive disorders: Secondary | ICD-10-CM

## 2023-10-14 DIAGNOSIS — Z0000 Encounter for general adult medical examination without abnormal findings: Secondary | ICD-10-CM | POA: Diagnosis not present

## 2023-10-14 DIAGNOSIS — Z780 Asymptomatic menopausal state: Secondary | ICD-10-CM

## 2023-10-14 DIAGNOSIS — R7301 Impaired fasting glucose: Secondary | ICD-10-CM | POA: Diagnosis not present

## 2023-10-14 DIAGNOSIS — E039 Hypothyroidism, unspecified: Secondary | ICD-10-CM

## 2023-10-14 DIAGNOSIS — I700 Atherosclerosis of aorta: Secondary | ICD-10-CM

## 2023-10-14 LAB — MICROALBUMIN, URINE WAIVED
Creatinine, Urine Waived: 10 mg/dL (ref 10–300)
Microalb, Ur Waived: 10 mg/L (ref 0–19)
Microalb/Creat Ratio: <30 mg/g (ref <30)

## 2023-10-14 LAB — BAYER DCA HB A1C WAIVED: HB A1C (BAYER DCA - WAIVED): 5.9 % — ABNORMAL HIGH (ref 4.8–5.6)

## 2023-10-14 NOTE — Assessment & Plan Note (Signed)
 Chronic, noted on imaging 02/03/2020.  Discussed guidelines for statin therapy and reviewed her ASCVD score + current labs.  She refuses statin therapy.  Continue to monitor.  She would like to pursue CT cardiac scoring and is aware out of pocket cost, order placed.

## 2023-10-14 NOTE — Progress Notes (Signed)
 BP 130/70 (BP Location: Left Arm, Patient Position: Sitting)   Pulse (!) 58   Temp 98.4 F (36.9 C) (Oral)   Ht 5' 4.1" (1.628 m)   Wt 168 lb 6.4 oz (76.4 kg)   SpO2 98%   BMI 28.82 kg/m    Subjective:    Patient ID: Veronica Mills, female    DOB: 1955/09/19, 68 y.o.   MRN: [id number]  HPI: Veronica Mills is a 68 y.o. female presenting on 10/14/2023 for comprehensive medical examination. Current medical complaints include:none  She currently lives with: self Menopausal Symptoms: no  HYPOTHYROIDISM Continues on Armour thyroid  with benefit. Tinnitus is getting worse, now having pressure sound and ringing now.  Has been to ENT in past. Thyroid  control status:stable Satisfied with current treatment? yes Medication side effects: no Medication compliance: good compliance Etiology of hypothyroidism: unknown Recent dose adjustment:no Fatigue: yes -- has been busy staying with her daughter and seeing new grand baby Cold intolerance: yes Heat intolerance: yes Weight gain: no Weight loss: no Constipation: no Diarrhea/loose stools: no Palpitations: no Lower extremity edema: no Anxiety/depressed mood: no   HYPERLIPIDEMIA No current medications.  Aortic atherosclerosis noted on imaging 02/03/2020. Takes plant sterols and fish oil -- been a little off on these with travel. Hyperlipidemia status: fair compliance Satisfied with current treatment?  yes Supplements: fish oil Aspirin:  no The 10-year ASCVD risk score (Arnett DK, et al., 2019) is: 10.3%   Values used to calculate the score:     Age: 49 years     Sex: Female     Is Non-Hispanic African American: No     Diabetic: Yes     Tobacco smoker: No     Systolic Blood Pressure: 130 mmHg     Is BP treated: No     HDL Cholesterol: 71 mg/dL     Total Cholesterol: 222 mg/dL Chest pain:  no Coronary artery disease:  no Family history CAD:  no Family history early CAD:  no   Impaired Fasting Glucose  HbA1C:  Lab Results   Component Value Date   HGBA1C 5.8 (H) 03/07/2023  Duration of elevated blood sugar: years Polydipsia: no Polyuria: no Weight change: no Visual disturbance: no Glucose Monitoring: no    Accucheck frequency: Not Checking    Fasting glucose:     Post prandial:  Diabetic Education: Not Completed Family history of diabetes: no   DEPRESSION Taking Naltrexone -Bupropion  daily -- started back on this one week ago to help with weight loss after travel. Mood status: stable Satisfied with current treatment?: yes Symptom severity: mild  Duration of current treatment : chronic Side effects: no Medication compliance: good compliance Depressed mood: occasional Anxious mood: occasional Anhedonia: no Significant weight loss or gain: no Insomnia: yes hard to fall asleep Fatigue: yes Feelings of worthlessness or guilt: no Impaired concentration/indecisiveness: sometimes Suicidal ideations: no Hopelessness: no Crying spells: no    10/14/2023    9:36 AM 06/02/2023    2:45 PM 03/07/2023    8:59 AM 12/13/2022    3:32 PM 09/02/2022    9:16 AM  Depression screen PHQ 2/9  Decreased Interest 1 1 1  0 1  Down, Depressed, Hopeless 1 1 0 0 1  PHQ - 2 Score 2 2 1  0 2  Altered sleeping 2 2 3  0 3  Tired, decreased energy 2 2 3  0 3  Change in appetite 1 0 0 0 0  Feeling bad or failure about yourself  0 0 0 0 0  Trouble concentrating 1 1 1  0 0  Moving slowly or fidgety/restless 0 0 0 0 0  Suicidal thoughts 0 0 0 0 0  PHQ-9 Score 8 7 8  0 8  Difficult doing work/chores Somewhat difficult Somewhat difficult Somewhat difficult Not difficult at all Somewhat difficult      10/14/2023    9:36 AM 06/02/2023    2:45 PM 03/07/2023    9:01 AM 09/02/2022    9:17 AM  GAD 7 : Generalized Anxiety Score  Nervous, Anxious, on Edge 0 0 0 0  Control/stop worrying 0 0 0 1  Worry too much - different things 0 0 0 1  Trouble relaxing 0 1 0 1  Restless 0 0 0 0  Easily annoyed or irritable 0 1 0 1  Afraid - awful  might happen 0 1 0 0  Total GAD 7 Score 0 3 0 4  Anxiety Difficulty Not difficult at all Not difficult at all Not difficult at all Somewhat difficult        09/02/2022    9:16 AM 12/13/2022    3:34 PM 03/07/2023    8:59 AM 06/02/2023    2:45 PM 10/14/2023    9:32 AM  Fall Risk  Falls in the past year? 0 0 0 0 0  Was there an injury with Fall? 0 0 0 0 0  Fall Risk Category Calculator 0 0 0 0 0  Patient at Risk for Falls Due to No Fall Risks No Fall Risks No Fall Risks No Fall Risks No Fall Risks  Fall risk Follow up Falls evaluation completed Falls prevention discussed;Falls evaluation completed Falls evaluation completed  Falls evaluation completed    Functional Status Survey: Is the patient deaf or have difficulty hearing?: No Does the patient have difficulty seeing, even when wearing glasses/contacts?: No Does the patient have difficulty concentrating, remembering, or making decisions?: No Does the patient have difficulty walking or climbing stairs?: No Does the patient have difficulty dressing or bathing?: No Does the patient have difficulty doing errands alone such as visiting a doctor's office or shopping?: No    Past Medical History:  Past Medical History:  Diagnosis Date   Allergy    Anxiety    Bursitis of both hips    Depression    Insomnia    Lyme disease    Sleep apnea    Sleep apnea    Thyroid  disease     Surgical History:  Past Surgical History:  Procedure Laterality Date   SHOULDER SURGERY Left 05/2009   TOTAL ABDOMINAL HYSTERECTOMY  06/21/1997    Medications:  Current Outpatient Medications on File Prior to Visit  Medication Sig   albuterol  (VENTOLIN  HFA) 108 (90 Base) MCG/ACT inhaler Inhale 2 puffs into the lungs every 6 (six) hours as needed for wheezing or shortness of breath.   ARMOUR THYROID  30 MG tablet Take 1 tablet (30 mg total) by mouth daily before breakfast.   cyclobenzaprine  (FLEXERIL ) 5 MG tablet Take 1 tablet (5 mg total) by mouth at  bedtime.   glucose blood (ONETOUCH ULTRA) test strip USE AS DIRECTED EVERY DAY   meloxicam  (MOBIC ) 15 MG tablet Take 1 tablet (15 mg total) by mouth daily.   Naltrexone -buPROPion  HCl ER 8-90 MG TB12 Take 1 tablet by mouth in the morning and at bedtime.   OneTouch Delica Lancets 30G MISC    No current facility-administered medications on file prior to visit.    Allergies:  Allergies  Allergen Reactions  Plaquenil [Hydroxychloroquine Sulfate] Itching   Rocephin [Ceftriaxone Sodium In Dextrose] Hives   Melatonin Other (See Comments)    Severe vertigo   Pneumococcal Vaccines Other (See Comments)   Prednisone Other (See Comments)    Increased pain sxs   Erythromycin     UTI   Macrobid  [Nitrofurantoin ] Rash   Trazodone And Nefazodone     Makes patient hyper. Ineffective.    Social History:  Social History   Socioeconomic History   Marital status: Married    Spouse name: Not on file   Number of children: Not on file   Years of education: Not on file   Highest education level: Master's degree (e.g., MA, MS, MEng, MEd, MSW, MBA)  Occupational History   Occupation: disabled   Tobacco Use   Smoking status: Former    Current packs/day: 0.00    Types: Cigarettes    Quit date: 1984    Years since quitting: 41.3   Smokeless tobacco: Never  Vaping Use   Vaping status: Never Used  Substance and Sexual Activity   Alcohol use: Not Currently    Comment: rarely   Drug use: No   Sexual activity: Not Currently    Birth control/protection: Surgical  Other Topics Concern   Not on file  Social History Narrative   Still new to the area   Social Drivers of Health   Financial Resource Strain: Low Risk  (12/13/2022)   Overall Financial Resource Strain (CARDIA)    Difficulty of Paying Living Expenses: Not hard at all  Food Insecurity: No Food Insecurity (12/13/2022)   Hunger Vital Sign    Worried About Running Out of Food in the Last Year: Never true    Ran Out of Food in the Last  Year: Never true  Transportation Needs: No Transportation Needs (12/13/2022)   PRAPARE - Administrator, Civil Service (Medical): No    Lack of Transportation (Non-Medical): No  Physical Activity: Insufficiently Active (12/13/2022)   Exercise Vital Sign    Days of Exercise per Week: 3 days    Minutes of Exercise per Session: 30 min  Stress: No Stress Concern Present (12/13/2022)   [employer] of Occupational Health - Occupational Stress Questionnaire    Feeling of Stress : Not at all  Social Connections: Moderately Isolated (12/13/2022)   Social Connection and Isolation Panel [NHANES]    Frequency of Communication with Friends and Family: Three times a week    Frequency of Social Gatherings with Friends and Family: Never    Attends Religious Services: Never    Database administrator or Organizations: No    Attends Banker Meetings: Never    Marital Status: Married  Catering manager Violence: Not At Risk (12/13/2022)   Humiliation, Afraid, Rape, and Kick questionnaire    Fear of Current or Ex-Partner: No    Emotionally Abused: No    Physically Abused: No    Sexually Abused: No   Social History   Tobacco Use  Smoking Status Former   Current packs/day: 0.00   Types: Cigarettes   Quit date: 1984   Years since quitting: 41.3  Smokeless Tobacco Never   Social History   Substance and Sexual Activity  Alcohol Use Not Currently   Comment: rarely    Family History:  Family History  Problem Relation Age of Onset   Mental illness Mother    Stroke Father    Dementia Father    Depression Sister    Hypertension  Sister    Obesity Sister    Depression Daughter    Depression Daughter    Diabetes Maternal Grandfather    Congestive Heart Failure Paternal Grandmother    Cancer Paternal Grandfather     Past medical history, surgical history, medications, allergies, family history and social history reviewed with patient today and changes made to  appropriate areas of the chart.   ROS All other ROS negative except what is listed above and in the HPI.      Objective:    BP 130/70 (BP Location: Left Arm, Patient Position: Sitting)   Pulse (!) 58   Temp 98.4 F (36.9 C) (Oral)   Ht 5' 4.1" (1.628 m)   Wt 168 lb 6.4 oz (76.4 kg)   SpO2 98%   BMI 28.82 kg/m   Wt Readings from Last 3 Encounters:  10/14/23 168 lb 6.4 oz (76.4 kg)  06/02/23 157 lb 6.4 oz (71.4 kg)  03/07/23 155 lb (70.3 kg)    Physical Exam Vitals and nursing note reviewed.  Constitutional:      General: She is awake. She is not in acute distress.    Appearance: She is well-developed and well-groomed. She is not ill-appearing or toxic-appearing.  HENT:     Head: Normocephalic and atraumatic.     Right Ear: Hearing, tympanic membrane, ear canal and external ear normal. No drainage.     Left Ear: Hearing, tympanic membrane, ear canal and external ear normal. No drainage.     Nose: Nose normal.     Right Sinus: No maxillary sinus tenderness or frontal sinus tenderness.     Left Sinus: No maxillary sinus tenderness or frontal sinus tenderness.     Mouth/Throat:     Mouth: Mucous membranes are moist.     Pharynx: Oropharynx is clear. Uvula midline. No pharyngeal swelling, oropharyngeal exudate or posterior oropharyngeal erythema.  Eyes:     General: Lids are normal.        Right eye: No discharge.        Left eye: No discharge.     Extraocular Movements: Extraocular movements intact.     Conjunctiva/sclera: Conjunctivae normal.     Pupils: Pupils are equal, round, and reactive to light.     Visual Fields: Right eye visual fields normal and left eye visual fields normal.  Neck:     Thyroid : No thyromegaly.     Vascular: No carotid bruit.     Trachea: Trachea normal.  Cardiovascular:     Rate and Rhythm: Normal rate and regular rhythm.     Heart sounds: Normal heart sounds. No murmur heard.    No gallop.  Pulmonary:     Effort: Pulmonary effort is  normal. No accessory muscle usage or respiratory distress.     Breath sounds: Normal breath sounds.  Chest:     Comments: Deferred per patient request. Abdominal:     General: Bowel sounds are normal.     Palpations: Abdomen is soft. There is no hepatomegaly or splenomegaly.     Tenderness: There is no abdominal tenderness.  Musculoskeletal:        General: Normal range of motion.     Cervical back: Normal range of motion and neck supple.     Right lower leg: No edema.     Left lower leg: No edema.  Lymphadenopathy:     Head:     Right side of head: No submental, submandibular, tonsillar, preauricular or posterior auricular adenopathy.  Left side of head: No submental, submandibular, tonsillar, preauricular or posterior auricular adenopathy.     Cervical: No cervical adenopathy.  Skin:    General: Skin is warm and dry.     Capillary Refill: Capillary refill takes less than 2 seconds.     Findings: No rash.  Neurological:     Mental Status: She is alert and oriented to person, place, and time.     Gait: Gait is intact.     Deep Tendon Reflexes: Reflexes are normal and symmetric.     Reflex Scores:      Brachioradialis reflexes are 2+ on the right side and 2+ on the left side.      Patellar reflexes are 2+ on the right side and 2+ on the left side. Psychiatric:        Attention and Perception: Attention normal.        Mood and Affect: Mood normal.        Speech: Speech normal.        Behavior: Behavior normal. Behavior is cooperative.        Thought Content: Thought content normal.        Judgment: Judgment normal.    Results for orders placed or performed in visit on 03/07/23  Comprehensive metabolic panel   Collection Time: 03/07/23  9:56 AM  Result Value Ref Range   Glucose 103 (H) 70 - 99 mg/dL   BUN 21 8 - 27 mg/dL   Creatinine, Ser 1.61 0.57 - 1.00 mg/dL   eGFR 97 >09 UE/AVW/0.98   BUN/Creatinine Ratio 30 (H) 12 - 28   Sodium 141 134 - 144 mmol/L   Potassium 4.4  3.5 - 5.2 mmol/L   Chloride 100 96 - 106 mmol/L   CO2 24 20 - 29 mmol/L   Calcium 10.3 8.7 - 10.3 mg/dL   Total Protein 7.0 6.0 - 8.5 g/dL   Albumin 4.6 3.9 - 4.9 g/dL   Globulin, Total 2.4 1.5 - 4.5 g/dL   Bilirubin Total 0.3 0.0 - 1.2 mg/dL   Alkaline Phosphatase 74 44 - 121 IU/L   AST 15 0 - 40 IU/L   ALT 15 0 - 32 IU/L  Lipid Panel w/o Chol/HDL Ratio   Collection Time: 03/07/23  9:56 AM  Result Value Ref Range   Cholesterol, Total 222 (H) 100 - 199 mg/dL   Triglycerides 71 0 - 149 mg/dL   HDL 71 >11 mg/dL   VLDL Cholesterol Cal 12 5 - 40 mg/dL   LDL Chol Calc (NIH) 914 (H) 0 - 99 mg/dL  HgB N8G   Collection Time: 03/07/23  9:56 AM  Result Value Ref Range   Hgb A1c MFr Bld 5.8 (H) 4.8 - 5.6 %   Est. average glucose Bld gHb Est-mCnc 120 mg/dL      Assessment & Plan:   Problem List Items Addressed This Visit       Cardiovascular and Mediastinum   Aortic atherosclerosis (HCC)   Chronic, noted on imaging 02/03/2020.  Discussed guidelines for statin therapy and reviewed her ASCVD score + current labs.  She refuses statin therapy.  Continue to monitor.  She would like to pursue CT cardiac scoring and is aware out of pocket cost, order placed.      Relevant Orders   Comprehensive metabolic panel with GFR   Lipid Panel w/o Chol/HDL Ratio   CT CARDIAC SCORING (SELF PAY ONLY)     Respiratory   OSA (obstructive sleep apnea)   Chronic,  stable.  Continue with current dental device.  Labs ordered today.          Endocrine   IFG (impaired fasting glucose)   Has been working on diet and weight loss with benefit.  Recheck A1c today was 5.9%, mild trend up.      Relevant Orders   Bayer DCA Hb A1c Waived   Microalbumin, Urine Waived   Hypothyroid   Chronic, stable.  Continue current medication regimen and adjust as needed.  Thyroid  labs obtained.      Relevant Orders   T4, free   CBC with Differential/Platelet   TSH     Other   Chronic insomnia (Chronic)   Chronic,  ongoing.  Poor tolerance to Trazodone and Melatonin.  Would be cautious performing trial of Belsomra as concern for side effects, she is very sensitive to medications at baseline.  Continue dental device at night.        Seasonal affective disorder (HCC) - Primary   Chronic, stable.  Denies SI/HI.  Is taking Contrave  at present for weight loss, which can also benefit mood.      Hyperlipemia   Chronic with elevation in LDL.  Discussed guidelines for statin therapy and reviewed her ASCVD score + current labs.  She refuses statin therapy.  Continue to monitor.  She is taking supplements at home.  Check labs today. She would like to pursue CT cardiac scoring and is aware out of pocket cost, order placed.      Relevant Orders   Comprehensive metabolic panel with GFR   Lipid Panel w/o Chol/HDL Ratio   CT CARDIAC SCORING (SELF PAY ONLY)   Other Visit Diagnoses       Postmenopausal estrogen deficiency       DEXA ordered and explained how to schedule.   Relevant Orders   DG Bone Density     Encounter for annual physical exam       Annual physical today with labs and health maintenance reviewed, discussed with patient.        Follow up plan: Return in about 6 months (around 04/14/2024) for Hypothyroid, HLD, ANXIETY.   LABORATORY TESTING:  - Pap smear: not applicable  IMMUNIZATIONS:   - Tdap: Tetanus vaccination status reviewed: last tetanus booster within 10 years. - Influenza: Refused - Pneumovax:  has reaction to years ago, does not get - Prevnar: as above - COVID: Up to date - HPV: Not applicable - Shingrix vaccine: Refused  SCREENING: -Mammogram: Refused  - Colonoscopy: Up to date Cologuard due on 12/15/24 - Bone Density: Ordered today  -Hearing Test: Not applicable  -Spirometry: Not applicable   PATIENT COUNSELING:   Advised to take 1 mg of folate supplement per day if capable of pregnancy.   Sexuality: Discussed sexually transmitted diseases, partner selection, use of  condoms, avoidance of unintended pregnancy  and contraceptive alternatives.   Advised to avoid cigarette smoking.  I discussed with the patient that most people either abstain from alcohol or drink within safe limits (<=14/week and <=4 drinks/occasion for males, <=7/weeks and <= 3 drinks/occasion for females) and that the risk for alcohol disorders and other health effects rises proportionally with the number of drinks per week and how often a drinker exceeds daily limits.  Discussed cessation/primary prevention of drug use and availability of treatment for abuse.   Diet: Encouraged to adjust caloric intake to maintain  or achieve ideal body weight, to reduce intake of dietary saturated fat and total fat, to limit sodium  intake by avoiding high sodium foods and not adding table salt, and to maintain adequate dietary potassium and calcium preferably from fresh fruits, vegetables, and low-fat dairy products.    Stressed the importance of regular exercise  Injury prevention: Discussed safety belts, safety helmets, smoke detector, smoking near bedding or upholstery.   Dental health: Discussed importance of regular tooth brushing, flossing, and dental visits.    NEXT PREVENTATIVE PHYSICAL DUE IN 1 YEAR. Return in about 6 months (around 04/14/2024) for Hypothyroid, HLD, ANXIETY.

## 2023-10-14 NOTE — Assessment & Plan Note (Signed)
 Has been working on diet and weight loss with benefit.  Recheck A1c today was 5.9%, mild trend up.

## 2023-10-14 NOTE — Assessment & Plan Note (Signed)
 Chronic with elevation in LDL.  Discussed guidelines for statin therapy and reviewed her ASCVD score + current labs.  She refuses statin therapy.  Continue to monitor.  She is taking supplements at home.  Check labs today. She would like to pursue CT cardiac scoring and is aware out of pocket cost, order placed.

## 2023-10-14 NOTE — Assessment & Plan Note (Signed)
 Chronic, stable.  Denies SI/HI.  Is taking Contrave  at present for weight loss, which can also benefit mood.

## 2023-10-14 NOTE — Assessment & Plan Note (Signed)
 Chronic, ongoing.  Poor tolerance to Trazodone and Melatonin.  Would be cautious performing trial of Belsomra as concern for side effects, she is very sensitive to medications at baseline.  Continue dental device at night.

## 2023-10-14 NOTE — Assessment & Plan Note (Signed)
Chronic, stable.  Continue with current dental device.  Labs ordered today.

## 2023-10-14 NOTE — Assessment & Plan Note (Signed)
 Chronic, stable.  Continue current medication regimen and adjust as needed.  Thyroid  labs obtained.

## 2023-10-15 LAB — LIPID PANEL W/O CHOL/HDL RATIO
Cholesterol, Total: 208 mg/dL — ABNORMAL HIGH (ref 100–199)
HDL: 66 mg/dL (ref >39)
LDL Chol Calc (NIH): 122 mg/dL — ABNORMAL HIGH (ref 0–99)
Triglycerides: 111 mg/dL (ref 0–149)
VLDL Cholesterol Cal: 20 mg/dL (ref 5–40)

## 2023-10-15 LAB — CBC WITH DIFFERENTIAL/PLATELET
Basophils Absolute: 0 10*3/uL (ref 0.0–0.2)
Basos: 1 %
EOS (ABSOLUTE): 0.2 10*3/uL (ref 0.0–0.4)
Eos: 4 %
Hematocrit: 45 % (ref 34.0–46.6)
Hemoglobin: 14.8 g/dL (ref 11.1–15.9)
Immature Grans (Abs): 0 10*3/uL (ref 0.0–0.1)
Immature Granulocytes: 0 %
Lymphocytes Absolute: 2 10*3/uL (ref 0.7–3.1)
Lymphs: 33 %
MCH: 30.5 pg (ref 26.6–33.0)
MCHC: 32.9 g/dL (ref 31.5–35.7)
MCV: 93 fL (ref 79–97)
Monocytes Absolute: 0.4 10*3/uL (ref 0.1–0.9)
Monocytes: 7 %
Neutrophils Absolute: 3.4 10*3/uL (ref 1.4–7.0)
Neutrophils: 55 %
Platelets: 260 10*3/uL (ref 150–450)
RBC: 4.85 x10E6/uL (ref 3.77–5.28)
RDW: 11.8 % (ref 11.7–15.4)
WBC: 6.1 10*3/uL (ref 3.4–10.8)

## 2023-10-15 LAB — COMPREHENSIVE METABOLIC PANEL WITH GFR
ALT: 22 IU/L (ref 0–32)
AST: 20 IU/L (ref 0–40)
Albumin: 4.5 g/dL (ref 3.9–4.9)
Alkaline Phosphatase: 75 IU/L (ref 44–121)
BUN/Creatinine Ratio: 23 (ref 12–28)
BUN: 16 mg/dL (ref 8–27)
Bilirubin Total: 0.4 mg/dL (ref 0.0–1.2)
CO2: 22 mmol/L (ref 20–29)
Calcium: 9.8 mg/dL (ref 8.7–10.3)
Chloride: 99 mmol/L (ref 96–106)
Creatinine, Ser: 0.71 mg/dL (ref 0.57–1.00)
Globulin, Total: 2.4 g/dL (ref 1.5–4.5)
Glucose: 105 mg/dL — ABNORMAL HIGH (ref 70–99)
Potassium: 4.4 mmol/L (ref 3.5–5.2)
Sodium: 139 mmol/L (ref 134–144)
Total Protein: 6.9 g/dL (ref 6.0–8.5)
eGFR: 94 mL/min/{1.73_m2} (ref >59)

## 2023-10-15 LAB — T4, FREE: Free T4: 0.93 ng/dL (ref 0.82–1.77)

## 2023-10-15 LAB — TSH: TSH: 1.39 u[IU]/mL (ref 0.450–4.500)

## 2023-10-16 ENCOUNTER — Encounter: Payer: Self-pay | Admitting: Nurse Practitioner

## 2023-10-16 NOTE — Progress Notes (Signed)
 Contacted via MyChart The 10-year ASCVD risk score (Arnett DK, et al., 2019) is: 10.2%   Values used to calculate the score:     Age: 68 years     Sex: Female     Is Non-Hispanic African American: No     Diabetic: Yes     Tobacco smoker: No     Systolic Blood Pressure: 130 mmHg     Is BP treated: No     HDL Cholesterol: 66 mg/dL     Total Cholesterol: 208 mg/dL

## 2023-10-21 ENCOUNTER — Ambulatory Visit
Admission: RE | Admit: 2023-10-21 | Discharge: 2023-10-21 | Disposition: A | Discharge status: Home / Self Care | Payer: Self-pay | Source: Ambulatory Visit | Attending: Nurse Practitioner | Admitting: Nurse Practitioner

## 2023-10-21 ENCOUNTER — Encounter: Payer: Self-pay | Admitting: Nurse Practitioner

## 2023-10-21 DIAGNOSIS — E785 Hyperlipidemia, unspecified: Secondary | ICD-10-CM | POA: Insufficient documentation

## 2023-10-21 DIAGNOSIS — I700 Atherosclerosis of aorta: Secondary | ICD-10-CM | POA: Insufficient documentation

## 2023-11-28 ENCOUNTER — Ambulatory Visit: Payer: Self-pay | Admitting: Nurse Practitioner

## 2023-11-28 ENCOUNTER — Ambulatory Visit
Admission: RE | Admit: 2023-11-28 | Discharge: 2023-11-28 | Disposition: A | Discharge status: Home / Self Care | Source: Ambulatory Visit | Attending: Nurse Practitioner | Admitting: Nurse Practitioner

## 2023-11-28 DIAGNOSIS — Z780 Asymptomatic menopausal state: Secondary | ICD-10-CM | POA: Insufficient documentation

## 2023-11-28 DIAGNOSIS — M810 Age-related osteoporosis without current pathological fracture: Secondary | ICD-10-CM | POA: Diagnosis not present

## 2023-11-28 NOTE — Progress Notes (Signed)
 Contacted via MyChart   Your imaging returned Veronica Mills, for bone density, and this is noticing osteoporosis.  Lumbar spine T-score is -2.7.  This means more fragile bone that can fracture easier.  For this we often start treatment with Fosamax, this is oral weekly medication.  It can help strengthen the bone.  Side effects can include heart burn issues or in severe cases jaw issues.  Definitely read up on this and let me know if you would like to trial this.  Often I will have patients start with this and if side effects then will change to alternate. Prolia is an injection we use which works well and at times we can get coverage for if poor tolerance of Fosamax.  Ensure you are taking Vitamin D3 2000 units daily and getting good calcium intake.  Any questions?  Have a great day!!

## 2024-01-18 ENCOUNTER — Telehealth: Payer: Self-pay | Admitting: Nurse Practitioner

## 2024-01-18 NOTE — Telephone Encounter (Signed)
 Copied from CRM [id number]. Topic: Medicare AWV >> Jan 18, 2024 10:23 AM Nathanel DEL wrote: Reason for CRM: Called LVM 01/18/2024 to schedule AWV. Please schedule Virtual or Telehealth visits ONLY.   Nathanel Paschal; Care Guide Ambulatory Clinical Support [place] [facility] Direct Dial: [phone]

## 2024-04-10 DIAGNOSIS — E119 Type 2 diabetes mellitus without complications: Secondary | ICD-10-CM | POA: Diagnosis not present

## 2024-04-10 DIAGNOSIS — H04123 Dry eye syndrome of bilateral lacrimal glands: Secondary | ICD-10-CM | POA: Diagnosis not present

## 2024-04-10 DIAGNOSIS — H43813 Vitreous degeneration, bilateral: Secondary | ICD-10-CM | POA: Diagnosis not present

## 2024-04-10 DIAGNOSIS — H40053 Ocular hypertension, bilateral: Secondary | ICD-10-CM | POA: Diagnosis not present

## 2024-04-15 NOTE — Patient Instructions (Incomplete)

## 2024-04-18 ENCOUNTER — Encounter: Payer: Self-pay | Admitting: Nurse Practitioner

## 2024-04-18 ENCOUNTER — Ambulatory Visit (INDEPENDENT_AMBULATORY_CARE_PROVIDER_SITE_OTHER): Admitting: Nurse Practitioner

## 2024-04-18 VITALS — BP 130/76 | HR 58 | Temp 97.9°F | Resp 15 | Ht 64.09 in | Wt 173.6 lb

## 2024-04-18 DIAGNOSIS — F338 Other recurrent depressive disorders: Secondary | ICD-10-CM

## 2024-04-18 DIAGNOSIS — I700 Atherosclerosis of aorta: Secondary | ICD-10-CM

## 2024-04-18 DIAGNOSIS — E782 Mixed hyperlipidemia: Secondary | ICD-10-CM | POA: Diagnosis not present

## 2024-04-18 DIAGNOSIS — G4733 Obstructive sleep apnea (adult) (pediatric): Secondary | ICD-10-CM | POA: Diagnosis not present

## 2024-04-18 DIAGNOSIS — G8929 Other chronic pain: Secondary | ICD-10-CM

## 2024-04-18 DIAGNOSIS — F5104 Psychophysiologic insomnia: Secondary | ICD-10-CM | POA: Diagnosis not present

## 2024-04-18 DIAGNOSIS — E039 Hypothyroidism, unspecified: Secondary | ICD-10-CM

## 2024-04-18 DIAGNOSIS — R7301 Impaired fasting glucose: Secondary | ICD-10-CM | POA: Diagnosis not present

## 2024-04-18 DIAGNOSIS — M25552 Pain in left hip: Secondary | ICD-10-CM

## 2024-04-18 DIAGNOSIS — M25551 Pain in right hip: Secondary | ICD-10-CM | POA: Diagnosis not present

## 2024-04-18 MED ORDER — ARMOUR THYROID 30 MG PO TABS: 30.0000 mg | ORAL_TABLET | Freq: Every day | ORAL | 4 refills | Status: AC

## 2024-04-18 MED ORDER — BUPROPION HCL ER (XL) 150 MG PO TB24: 150.0000 mg | ORAL_TABLET | Freq: Every day | ORAL | 3 refills | Status: DC

## 2024-04-18 NOTE — Assessment & Plan Note (Addendum)
 Chronic, noted on imaging 02/03/2020. Discussed guidelines for statin therapy and reviewed her ASCVD score + current labs.  Her Cardiac CT scoring was 0 on 10/21/23.  Will maintain off statin therapy at this time and continue diet focus. Plan to repeat imaging in a few years.

## 2024-04-18 NOTE — Assessment & Plan Note (Signed)
Chronic, stable.  Continue with current dental device.  Labs ordered today.

## 2024-04-18 NOTE — Assessment & Plan Note (Signed)
 Chronic, ongoing.  Poor tolerance to Trazodone and Melatonin. Continue dental device at night.  Wrote some medications that she could trial down for patient to check on coverage and cost.  Discussed with her prefer not to use Ambien due to BEERS criteria, but if covered we could trial a low dose.

## 2024-04-18 NOTE — Assessment & Plan Note (Signed)
 Has been working on diet and weight loss with benefit.  Recheck A1c today was 5.9% last check.

## 2024-04-18 NOTE — Assessment & Plan Note (Signed)
 Chronic, stable.  Denies SI/HI. Currently no medications and symptoms worse due to season. Will restart her Wellbutrin  XL 150 MG daily which she tolerated and found benefit from in past.  She is aware how medication works and side effects.

## 2024-04-18 NOTE — Assessment & Plan Note (Signed)
Chronic, stable.  Continue current medication regimen and adjust as needed.  Thyroid labs up to date. 

## 2024-04-18 NOTE — Progress Notes (Signed)
 BP 130/76 (BP Location: Left Arm, Patient Position: Sitting, Cuff Size: Normal)   Pulse (!) 58   Temp 97.9 F (36.6 C) (Oral)   Resp 15   Ht 5' 4.09 (1.628 m)   Wt 173 lb 9.6 oz (78.7 kg)   SpO2 99%   BMI 29.71 kg/m    Subjective:    Patient ID: Veronica Mills, female    DOB: 1955/11/28, 68 y.o.   MRN: [id number]  HPI: Veronica Mills is a 68 y.o. female  Chief Complaint  Patient presents with   Hypothyroidism   Hyperlipidemia   Has had chronic hip pain for a long time, goes to massage which helps.  L>R hip. Right knee also causes discomfort.  Would like to attend PT.  HYPOTHYROIDISM Takes Armour thyroid  with benefit.  Thyroid  control status:stable Satisfied with current treatment? yes Medication side effects: no Medication compliance: good compliance Etiology of hypothyroidism: unknown Recent dose adjustment:no Fatigue: ongoing fatigue Cold intolerance: no Heat intolerance: yes Weight gain: yes Weight loss: no Constipation: no Diarrhea/loose stools: no Palpitations: no Lower extremity edema: no Anxiety/depressed mood: yes  HYPERLIPIDEMIA Aortic atherosclerosis noted on imaging 02/03/2020. No statin therapy at present. Taking plant sterols and fish oil.  Coronary calcium score was 0 on 10/21/23. Hyperlipidemia status: good compliance Supplements: none Aspirin:  no The 10-year ASCVD risk score (Arnett DK, et al., 2019) is: 11.4%   Values used to calculate the score:     Age: 34 years     Clincally relevant sex: Female     Is Non-Hispanic African American: No     Diabetic: Yes     Tobacco smoker: No     Systolic Blood Pressure: 130 mmHg     Is BP treated: No     HDL Cholesterol: 66 mg/dL     Total Cholesterol: 208 mg/dL Chest pain:  no Coronary artery disease:  no Family history CAD:  yes -- father in later life lacunar strokes Family history early CAD:  no   Impaired Fasting Glucose HbA1C:  Lab Results  Component Value Date   HGBA1C 5.9 (H) 10/14/2023   Duration of elevated blood sugar: chronic Polydipsia: no Polyuria: no Weight change: no Visual disturbance: no Glucose Monitoring: no    Accucheck frequency: Not Checking    Fasting glucose:     Post prandial:  Diabetic Education: Not Completed Family history of diabetes: maternal grandfather   DEPRESSION No current medications, does find this challenging especially with time of year. Takes Magnesium at night, has for years. Trazodone causes her to be awake and Melatonin causes vertigo. Uses dental device for sleep. Mood status: stable Satisfied with current treatment?: yes Symptom severity: moderate  Duration of current treatment : chronic Side effects: no Medication compliance: good compliance Psychotherapy/counseling: yes in the past Previous psychiatric medications: Cymbalta, Trazodone, Melatonin, Wellbutrin  Depressed mood: yes  Anxious mood: no Anhedonia: yes Significant weight loss or gain: no Insomnia: yes hard to fall asleep and stay asleep Fatigue: yes Feelings of worthlessness or guilt: no Impaired concentration/indecisiveness: yes Suicidal ideations: no Hopelessness: no Crying spells: yes    04/18/2024    9:09 AM 10/14/2023    9:36 AM 06/02/2023    2:45 PM 03/07/2023    8:59 AM 12/13/2022    3:32 PM  Depression screen PHQ 2/9  Decreased Interest 3 1 1 1  0  Down, Depressed, Hopeless 3 1 1  0 0  PHQ - 2 Score 6 2 2 1  0  Altered sleeping 3 2 2  3 0  Tired, decreased energy 3 2 2 3  0  Change in appetite 3 1 0 0 0  Feeling bad or failure about yourself  0 0 0 0 0  Trouble concentrating 3 1 1 1  0  Moving slowly or fidgety/restless 0 0 0 0 0  Suicidal thoughts 0 0 0 0 0  PHQ-9 Score 18 8 7 8  0  Difficult doing work/chores Very difficult Somewhat difficult Somewhat difficult Somewhat difficult Not difficult at all       04/18/2024    9:09 AM 10/14/2023    9:36 AM 06/02/2023    2:45 PM 03/07/2023    9:01 AM  GAD 7 : Generalized Anxiety Score  Nervous,  Anxious, on Edge 0 0 0 0  Control/stop worrying 0 0 0 0  Worry too much - different things 0 0 0 0  Trouble relaxing 0 0 1 0  Restless 0 0 0 0  Easily annoyed or irritable 3 0 1 0  Afraid - awful might happen 0 0 1 0  Total GAD 7 Score 3 0 3 0  Anxiety Difficulty Somewhat difficult Not difficult at all Not difficult at all Not difficult at all   Relevant past medical, surgical, family and social history reviewed and updated as indicated. Interim medical history since our last visit reviewed. Allergies and medications reviewed and updated.  Review of Systems  Constitutional:  Positive for fatigue. Negative for activity change, appetite change, diaphoresis and fever.  Respiratory:  Negative for cough, chest tightness, shortness of breath and wheezing.   Cardiovascular:  Negative for chest pain, palpitations and leg swelling.  Endocrine: Negative for cold intolerance, heat intolerance, polydipsia, polyphagia and polyuria.  Musculoskeletal:  Positive for arthralgias.  Neurological: Negative.  Negative for syncope.  Psychiatric/Behavioral:  Positive for decreased concentration and sleep disturbance. Negative for self-injury and suicidal ideas. The patient is nervous/anxious.    Per HPI unless specifically indicated above     Objective:    BP 130/76 (BP Location: Left Arm, Patient Position: Sitting, Cuff Size: Normal)   Pulse (!) 58   Temp 97.9 F (36.6 C) (Oral)   Resp 15   Ht 5' 4.09 (1.628 m)   Wt 173 lb 9.6 oz (78.7 kg)   SpO2 99%   BMI 29.71 kg/m   Wt Readings from Last 3 Encounters:  04/18/24 173 lb 9.6 oz (78.7 kg)  10/14/23 168 lb 6.4 oz (76.4 kg)  06/02/23 157 lb 6.4 oz (71.4 kg)    Physical Exam Vitals and nursing note reviewed.  Constitutional:      General: She is awake. She is not in acute distress.    Appearance: She is well-developed and well-groomed. She is not ill-appearing or toxic-appearing.  HENT:     Head: Normocephalic.     Right Ear: Hearing and  external ear normal.     Left Ear: Hearing and external ear normal.  Eyes:     General: Lids are normal.        Right eye: No discharge.        Left eye: No discharge.     Conjunctiva/sclera: Conjunctivae normal.     Pupils: Pupils are equal, round, and reactive to light.  Neck:     Thyroid : No thyromegaly.     Vascular: No carotid bruit.  Cardiovascular:     Rate and Rhythm: Normal rate and regular rhythm.     Heart sounds: Normal heart sounds. No murmur heard.    No gallop.  Pulmonary:     Effort: Pulmonary effort is normal. No accessory muscle usage or respiratory distress.     Breath sounds: Normal breath sounds.  Abdominal:     General: Bowel sounds are normal. There is no distension.     Palpations: Abdomen is soft.     Tenderness: There is no abdominal tenderness.  Musculoskeletal:     Cervical back: Normal range of motion and neck supple.     Right lower leg: No edema.     Left lower leg: No edema.  Lymphadenopathy:     Cervical: No cervical adenopathy.  Skin:    General: Skin is warm and dry.  Neurological:     Mental Status: She is alert and oriented to person, place, and time.     Deep Tendon Reflexes: Reflexes are normal and symmetric.     Reflex Scores:      Brachioradialis reflexes are 2+ on the right side and 2+ on the left side.      Patellar reflexes are 2+ on the right side and 2+ on the left side. Psychiatric:        Attention and Perception: Attention normal.        Mood and Affect: Mood normal.        Speech: Speech normal.        Behavior: Behavior normal. Behavior is cooperative.        Thought Content: Thought content normal.    Results for orders placed or performed in visit on 10/14/23  Bayer DCA Hb A1c Waived   Collection Time: 10/14/23  9:37 AM  Result Value Ref Range   HB A1C (BAYER DCA - WAIVED) 5.9 (H) 4.8 - 5.6 %  Microalbumin, Urine Waived   Collection Time: 10/14/23  9:37 AM  Result Value Ref Range   Microalb, Ur Waived 10 0 - 19  mg/L   Creatinine, Urine Waived 10 10 - 300 mg/dL   Microalb/Creat Ratio <30 <30 mg/g  T4, free   Collection Time: 10/14/23  9:38 AM  Result Value Ref Range   Free T4 0.93 0.82 - 1.77 ng/dL  CBC with Differential/Platelet   Collection Time: 10/14/23  9:38 AM  Result Value Ref Range   WBC 6.1 3.4 - 10.8 x10E3/uL   RBC 4.85 3.77 - 5.28 x10E6/uL   Hemoglobin 14.8 11.1 - 15.9 g/dL   Hematocrit 54.9 65.9 - 46.6 %   MCV 93 79 - 97 fL   MCH 30.5 26.6 - 33.0 pg   MCHC 32.9 31.5 - 35.7 g/dL   RDW 88.1 88.2 - 84.5 %   Platelets 260 150 - 450 x10E3/uL   Neutrophils 55 Not Estab. %   Lymphs 33 Not Estab. %   Monocytes 7 Not Estab. %   Eos 4 Not Estab. %   Basos 1 Not Estab. %   Neutrophils Absolute 3.4 1.4 - 7.0 x10E3/uL   Lymphocytes Absolute 2.0 0.7 - 3.1 x10E3/uL   Monocytes Absolute 0.4 0.1 - 0.9 x10E3/uL   EOS (ABSOLUTE) 0.2 0.0 - 0.4 x10E3/uL   Basophils Absolute 0.0 0.0 - 0.2 x10E3/uL   Immature Granulocytes 0 Not Estab. %   Immature Grans (Abs) 0.0 0.0 - 0.1 x10E3/uL  Comprehensive metabolic panel with GFR   Collection Time: 10/14/23  9:38 AM  Result Value Ref Range   Glucose 105 (H) 70 - 99 mg/dL   BUN 16 8 - 27 mg/dL   Creatinine, Ser 9.28 0.57 - 1.00 mg/dL   eGFR 94 >40  mL/min/1.73   BUN/Creatinine Ratio 23 12 - 28   Sodium 139 134 - 144 mmol/L   Potassium 4.4 3.5 - 5.2 mmol/L   Chloride 99 96 - 106 mmol/L   CO2 22 20 - 29 mmol/L   Calcium 9.8 8.7 - 10.3 mg/dL   Total Protein 6.9 6.0 - 8.5 g/dL   Albumin 4.5 3.9 - 4.9 g/dL   Globulin, Total 2.4 1.5 - 4.5 g/dL   Bilirubin Total 0.4 0.0 - 1.2 mg/dL   Alkaline Phosphatase 75 44 - 121 IU/L   AST 20 0 - 40 IU/L   ALT 22 0 - 32 IU/L  TSH   Collection Time: 10/14/23  9:38 AM  Result Value Ref Range   TSH 1.390 0.450 - 4.500 uIU/mL  Lipid Panel w/o Chol/HDL Ratio   Collection Time: 10/14/23  9:38 AM  Result Value Ref Range   Cholesterol, Total 208 (H) 100 - 199 mg/dL   Triglycerides 888 0 - 149 mg/dL   HDL 66 >60  mg/dL   VLDL Cholesterol Cal 20 5 - 40 mg/dL   LDL Chol Calc (NIH) 877 (H) 0 - 99 mg/dL      Assessment & Plan:   Problem List Items Addressed This Visit       Cardiovascular and Mediastinum   Aortic atherosclerosis   Chronic, noted on imaging 02/03/2020. Discussed guidelines for statin therapy and reviewed her ASCVD score + current labs.  Her Cardiac CT scoring was 0 on 10/21/23.  Will maintain off statin therapy at this time and continue diet focus. Plan to repeat imaging in a few years.        Respiratory   OSA (obstructive sleep apnea)   Chronic, stable.  Continue with current dental device.  Labs ordered today.          Endocrine   IFG (impaired fasting glucose)   Has been working on diet and weight loss with benefit.  Recheck A1c today was 5.9% last check.      Relevant Orders   HgB A1c   Basic metabolic panel with GFR   Hypothyroid   Chronic, stable.  Continue current medication regimen and adjust as needed.  Thyroid  labs up to date.      Relevant Medications   ARMOUR THYROID  30 MG tablet     Other   Chronic insomnia (Chronic)   Chronic, ongoing.  Poor tolerance to Trazodone and Melatonin. Continue dental device at night.  Wrote some medications that she could trial down for patient to check on coverage and cost.  Discussed with her prefer not to use Ambien due to BEERS criteria, but if covered we could trial a low dose.      Seasonal affective disorder   Chronic, stable.  Denies SI/HI. Currently no medications and symptoms worse due to season. Will restart her Wellbutrin  XL 150 MG daily which she tolerated and found benefit from in past.  She is aware how medication works and side effects.      Relevant Medications   buPROPion  (WELLBUTRIN  XL) 150 MG 24 hr tablet   Hyperlipemia - Primary   Chronic with elevation in LDL. Discussed guidelines for statin therapy and reviewed her ASCVD score + current labs.  Her Cardiac CT scoring was 0 on 10/21/23.  Will maintain off  statin therapy at this time and continue diet focus. Plan to repeat imaging in a few years.      Relevant Orders   Lipid Panel w/o Chol/HDL Ratio  Other Visit Diagnoses       Chronic hip pain, bilateral       Referral to PT per request.   Relevant Medications   buPROPion  (WELLBUTRIN  XL) 150 MG 24 hr tablet   Other Relevant Orders   Ambulatory referral to Physical Therapy         Follow up plan: Return in about 6 months (around 10/17/2024) for Annual Physical - after 10/13/24.

## 2024-04-18 NOTE — Assessment & Plan Note (Signed)
 Chronic with elevation in LDL. Discussed guidelines for statin therapy and reviewed her ASCVD score + current labs.  Her Cardiac CT scoring was 0 on 10/21/23.  Will maintain off statin therapy at this time and continue diet focus. Plan to repeat imaging in a few years.

## 2024-04-19 ENCOUNTER — Ambulatory Visit

## 2024-04-19 ENCOUNTER — Ambulatory Visit: Payer: Self-pay | Admitting: Nurse Practitioner

## 2024-04-19 LAB — BASIC METABOLIC PANEL WITH GFR
BUN/Creatinine Ratio: 22 (ref 12–28)
BUN: 16 mg/dL (ref 8–27)
CO2: 23 mmol/L (ref 20–29)
Calcium: 10.1 mg/dL (ref 8.7–10.3)
Chloride: 101 mmol/L (ref 96–106)
Creatinine, Ser: 0.72 mg/dL (ref 0.57–1.00)
Glucose: 119 mg/dL — ABNORMAL HIGH (ref 70–99)
Potassium: 4.5 mmol/L (ref 3.5–5.2)
Sodium: 138 mmol/L (ref 134–144)
eGFR: 92 mL/min/1.73 (ref >59)

## 2024-04-19 LAB — LIPID PANEL W/O CHOL/HDL RATIO
Cholesterol, Total: 220 mg/dL — ABNORMAL HIGH (ref 100–199)
HDL: 61 mg/dL (ref >39)
LDL Chol Calc (NIH): 137 mg/dL — ABNORMAL HIGH (ref 0–99)
Triglycerides: 127 mg/dL (ref 0–149)
VLDL Cholesterol Cal: 22 mg/dL (ref 5–40)

## 2024-04-19 LAB — HEMOGLOBIN A1C
Est. average glucose Bld gHb Est-mCnc: 128 mg/dL
Hgb A1c MFr Bld: 6.1 % — ABNORMAL HIGH (ref 4.8–5.6)

## 2024-04-19 NOTE — Progress Notes (Signed)
 Contacted via MyChart The 10-year ASCVD risk score (Arnett DK, et al., 2019) is: 12.1%   Values used to calculate the score:     Age: 68 years     Clincally relevant sex: Female     Is Non-Hispanic African American: No     Diabetic: Yes     Tobacco smoker: No     Systolic Blood Pressure: 130 mmHg     Is BP treated: No     HDL Cholesterol: 61 mg/dL     Total Cholesterol: 220 mg/dL  Good afternoon Veronica Mills, your labs have returned: - Kidney function, creatinine and eGFR, remains normal. - Lipid panel continues to show elevations, but we know your cardiac scoring was low. We will plan to repeat that in 3 years. Focus on healthy diet and exercise. - A1c is showing a little trend up this check from 5.9 to 6.1%. Any level 6.5% or greater is considered diabetes. For prediabetes focus on reducing sugar and carbohydrates. Get 30 minutes of exercise 5 days a week.  Any questions? Keep being amazing!!  Thank you for allowing me to participate in your care.  I appreciate you. Kindest regards, Fines Kimberlin

## 2024-04-20 ENCOUNTER — Telehealth: Payer: Self-pay | Admitting: Nurse Practitioner

## 2024-04-20 NOTE — Telephone Encounter (Signed)
 Copied from CRM [id number]. Topic: Appointments - Scheduling Inquiry for Clinic >> Apr 20, 2024  3:28 PM [name] wrote: Called [facility] to follow up on request today to make sure it is changed to virtual. She says they will send her a code to join a visit and the in person visit mode is fine. If this process differs to joining a video visit from mychart, please give her a call to let her know.

## 2024-04-23 ENCOUNTER — Ambulatory Visit (INDEPENDENT_AMBULATORY_CARE_PROVIDER_SITE_OTHER)

## 2024-04-23 VITALS — Ht 64.09 in | Wt 173.0 lb

## 2024-04-23 DIAGNOSIS — Z0000 Encounter for general adult medical examination without abnormal findings: Secondary | ICD-10-CM

## 2024-04-23 NOTE — Telephone Encounter (Signed)
 Patient scheduled again for 04/23/2024 and visit completed.

## 2024-04-23 NOTE — Progress Notes (Signed)
 Subjective:   Veronica Mills is a 68 y.o. female who presents for a Medicare Annual Wellness Visit.  Allergies (verified) Plaquenil [hydroxychloroquine sulfate], Rocephin [ceftriaxone sodium in dextrose], Melatonin, Pneumococcal vaccines, Prednisone, Erythromycin, Macrobid  [nitrofurantoin ], and Trazodone and nefazodone   History: Past Medical History:  Diagnosis Date   Allergy    Anxiety    Asthma 1998   not currently treated   Bursitis of both hips    Depression    Insomnia    Lyme disease    Sleep apnea    Sleep apnea    Thyroid  disease    Past Surgical History:  Procedure Laterality Date   SHOULDER SURGERY Left 05/2009   TOTAL ABDOMINAL HYSTERECTOMY  06/21/1997   Family History  Problem Relation Age of Onset   Mental illness Mother    Stroke Father    Dementia Father    Depression Sister    Hypertension Sister    Obesity Sister    Depression Daughter    Depression Daughter    Diabetes Maternal Grandfather    Congestive Heart Failure Paternal Grandmother    Cancer Paternal Grandfather    Social History   Occupational History   Occupation: disabled   Tobacco Use   Smoking status: Former    Current packs/day: 0.00    Types: Cigarettes    Quit date: 1984    Years since quitting: 41.8   Smokeless tobacco: Never  Vaping Use   Vaping status: Never Used  Substance and Sexual Activity   Alcohol use: Not Currently    Comment: rarely   Drug use: No   Sexual activity: Not Currently    Birth control/protection: Surgical   Tobacco Counseling Counseling given: Not Answered  SDOH Screenings   Food Insecurity: No Food Insecurity (04/23/2024)  Housing: Unknown (04/23/2024)  Transportation Needs: No Transportation Needs (12/13/2022)  Utilities: Not At Risk (04/23/2024)  Alcohol Screen: Low Risk  (12/13/2022)  Depression (PHQ2-9): High Risk (04/23/2024)  Financial Resource Strain: Low Risk  (12/13/2022)  Physical Activity: Inactive (04/23/2024)  Social Connections:  Moderately Isolated (04/23/2024)  Stress: No Stress Concern Present (04/23/2024)  Tobacco Use: Medium Risk (04/23/2024)  Health Literacy: Adequate Health Literacy (04/23/2024)   Depression Screen    04/23/2024    1:28 PM 04/18/2024    9:09 AM 10/14/2023    9:36 AM 06/02/2023    2:45 PM 03/07/2023    8:59 AM 12/13/2022    3:32 PM 09/02/2022    9:16 AM  PHQ 2/9 Scores  PHQ - 2 Score 6 6 2 2 1  0 2  PHQ- 9 Score 18 18 8 7 8  0 8     Goals Addressed             This Visit's Progress    DIET - EAT MORE FRUITS AND VEGETABLES   On track    Patient Stated   On track    11/28/2020, lose weight and lower A1C     Patient Stated   On track    Would like to loose weight       Visit info / Clinical Intake: Medicare Wellness Visit Type:: Subsequent Annual Wellness Visit Medicare Wellness Visit Mode:: Telephone If telephone:: video error If telephone or video:: vitals recorded from last visit Interpreter Needed?: No Pre-visit prep was completed: yes AWV questionnaire completed by patient prior to visit?: no Living arrangements:: lives with spouse/significant other Patient's Overall Health Status Rating: very good Typical amount of pain: some (Depends on the day. Starting  PT next week.) Does pain affect daily life?: (!) yes (Starting PT next week.) Are you currently prescribed opioids?: no  Dietary Habits and Nutritional Risks How many meals a day?: 3 Eats fruit and vegetables daily?: yes Most meals are obtained by: preparing own meals Diabetic:: no  Functional Status Activities of Daily Living (to include ambulation/medication): Independent Ambulation: Independent Medication Administration: Independent Home Management: Independent Manage your own finances?: yes Primary transportation is: driving Concerns about vision?: (!) yes (Wears glasses) Concerns about hearing?: (!) yes (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.) Uses hearing aids?: no (Not recommened) Hear whispered voice?: (!)  no *in-person visit only*  Fall Screening Falls in the past year?: 0 Number of falls in past year: 0 Was there an injury with Fall?: 0 Fall Risk Category Calculator: 0 Patient Fall Risk Level: Low Fall Risk  Fall Risk Patient at Risk for Falls Due to: No Fall Risks Fall risk Follow up: Falls evaluation completed  Home and Transportation Safety: All rugs have non-skid backing?: N/A, no rugs All stairs or steps have railings?: yes Grab bars in the bathtub or shower?: (!) no Have non-skid surface in bathtub or shower?: yes Good home lighting?: yes Regular seat belt use?: yes Hospital stays in the last year:: no  Cognitive Assessment Difficulty concentrating, remembering, or making decisions? : no Will 6CIT or Mini Cog be Completed: no 6CIT or Mini Cog Declined: patient alert, oriented, able to answer questions appropriately and recall recent events  Advance Directives (For Healthcare) Does Patient Have a Medical Advance Directive?: No Would patient like information on creating a medical advance directive?: Yes (MAU/Ambulatory/Procedural Areas - Information given)  Reviewed/Updated  Reviewed/Updated: All; Medical History; Surgical History; Family History; Medications; Allergies; Care Teams; Patient Goals        Objective:    Today's Vitals   04/23/24 1306  Weight: 173 lb (78.5 kg)  Height: 5' 4.09 (1.628 m)  PainSc: 5   PainLoc: Hip   Body mass index is 29.61 kg/m.  Current Medications (verified) Outpatient Encounter Medications as of 04/23/2024  Medication Sig   albuterol  (VENTOLIN  HFA) 108 (90 Base) MCG/ACT inhaler Inhale 2 puffs into the lungs every 6 (six) hours as needed for wheezing or shortness of breath.   ARMOUR THYROID  30 MG tablet Take 1 tablet (30 mg total) by mouth daily before breakfast.   buPROPion  (WELLBUTRIN  XL) 150 MG 24 hr tablet Take 1 tablet (150 mg total) by mouth daily.   cyclobenzaprine  (FLEXERIL ) 5 MG tablet Take 1 tablet (5 mg total) by  mouth at bedtime.   glucose blood (ONETOUCH ULTRA) test strip USE AS DIRECTED EVERY DAY   meloxicam  (MOBIC ) 15 MG tablet Take 1 tablet (15 mg total) by mouth daily.   OneTouch Delica Lancets 30G MISC    No facility-administered encounter medications on file as of 04/23/2024.   Hearing/Vision screen No results found. Immunizations and Health Maintenance Health Maintenance  Topic Date Due   COVID-19 Vaccine (8 - 2025-26 season) 05/01/2024 (Originally 02/20/2024)   Zoster Vaccines- Shingrix (1 of 2) 07/16/2024 (Originally 04/11/2008)   Influenza Vaccine  09/18/2024 (Originally 01/20/2024)   Pneumococcal Vaccine: 50+ Years (2 of 2 - PCV) 10/13/2024 (Originally 04/25/2000)   Mammogram  04/18/2025 (Originally 08/25/2015)   Fecal DNA (Cologuard)  12/15/2024   Medicare Annual Wellness (AWV)  04/23/2025   DEXA SCAN  11/27/2025   DTaP/Tdap/Td (2 - Td or Tdap) 04/07/2033   Hepatitis C Screening  Completed   Meningococcal B Vaccine  Aged Out  Assessment/Plan:  This is a routine wellness examination for Dia.  Patient Care Team: Valerio Melanie DASEN, NP as PCP - General (Nurse Practitioner) [facility] (Dentistry)  I have personally reviewed and noted the following in the patient's chart:   Medical and social history Use of alcohol, tobacco or illicit drugs  Current medications and supplements including opioid prescriptions. Functional ability and status Nutritional status Physical activity Advanced directives List of other physicians Hospitalizations, surgeries, and ER visits in previous 12 months Vitals Screenings to include cognitive, depression, and falls Referrals and appointments  No orders of the defined types were placed in this encounter.  In addition, I have reviewed and discussed with patient certain preventive protocols, quality metrics, and best practice recommendations. A written personalized care plan for preventive services as well  as general preventive health recommendations were provided to patient.   Comer HERO Charlsie Fleeger, CMA   04/23/2024   Return in 1 year (on 04/23/2025).  After Visit Summary: (MyChart) Due to this being a telephonic visit, the after visit summary with patients personalized plan was offered to patient via MyChart  Notes: Patient aware education to be mailed discussing Healthcare Power of [id number] and Mental Power of [place].

## 2024-04-23 NOTE — Patient Instructions (Signed)
 Fall Prevention in the Home, Adult Falls can cause injuries and can happen to people of all ages. There are many things you can do to make your home safer and to help prevent falls. What actions can I take to prevent falls? General information Use good lighting in all rooms. Make sure to: Replace any light bulbs that burn out. Turn on the lights in dark areas and use night-lights. Keep items that you use often in easy-to-reach places. Lower the shelves around your home if needed. Move furniture so that there are clear paths around it. Do not use throw rugs or other things on the floor that can make you trip. If any of your floors are uneven, fix them. Add color or contrast paint or tape to clearly mark and help you see: Grab bars or handrails. First and last steps of staircases. Where the edge of each step is. If you use a ladder or stepladder: Make sure that it is fully opened. Do not climb a closed ladder. Make sure the sides of the ladder are locked in place. Have someone hold the ladder while you use it. Know where your pets are as you move through your home. What can I do in the bathroom?     Keep the floor dry. Clean up any water on the floor right away. Remove soap buildup in the bathtub or shower. Buildup makes bathtubs and showers slippery. Use non-skid mats or decals on the floor of the bathtub or shower. Attach bath mats securely with double-sided, non-slip rug tape. If you need to sit down in the shower, use a non-slip stool. Install grab bars by the toilet and in the bathtub and shower. Do not use towel bars as grab bars. What can I do in the bedroom? Make sure that you have a light by your bed that is easy to reach. Do not use any sheets or blankets on your bed that hang to the floor. Have a firm chair or bench with side arms that you can use for support when you get dressed. What can I do in the kitchen? Clean up any spills right away. If you need to reach something  above you, use a step stool with a grab bar. Keep electrical cords out of the way. Do not use floor polish or wax that makes floors slippery. What can I do with my stairs? Do not leave anything on the stairs. Make sure that you have a light switch at the top and the bottom of the stairs. Make sure that there are handrails on both sides of the stairs. Fix handrails that are broken or loose. Install non-slip stair treads on all your stairs if they do not have carpet. Avoid having throw rugs at the top or bottom of the stairs. Choose a carpet that does not hide the edge of the steps on the stairs. Make sure that the carpet is firmly attached to the stairs. Fix carpet that is loose or worn. What can I do on the outside of my home? Use bright outdoor lighting. Fix the edges of walkways and driveways and fix any cracks. Clear paths of anything that can make you trip, such as tools or rocks. Add color or contrast paint or tape to clearly mark and help you see anything that might make you trip as you walk through a door, such as a raised step or threshold. Trim any bushes or trees on paths to your home. Check to see if handrails are loose  or broken and that both sides of all steps have handrails. Install guardrails along the edges of any raised decks and porches. Have leaves, snow, or ice cleared regularly. Use sand, salt, or ice melter on paths if you live where there is ice and snow during the winter. Clean up any spills in your garage right away. This includes grease or oil spills. What other actions can I take? Review your medicines with your doctor. Some medicines can cause dizziness or changes in blood pressure, which increase your risk of falling. Wear shoes that: Have a low heel. Do not wear high heels. Have rubber bottoms and are closed at the toe. Feel good on your feet and fit well. Use tools that help you move around if needed. These include: Canes. Walkers. Scooters. Crutches. Ask  your doctor what else you can do to help prevent falls. This may include seeing a physical therapist to learn to do exercises to move better and get stronger. Where to find more information Centers for Disease Control and Prevention, [employer]: tonerpromos.no [employer] on Aging: baseringtones.pl National Institute on Aging: baseringtones.pl Contact a doctor if: You are afraid of falling at home. You feel weak, drowsy, or dizzy at home. You fall at home. Get help right away if you: Lose consciousness or have trouble moving after a fall. Have a fall that causes a head injury. These symptoms may be an emergency. Get help right away. Call 911. Do not wait to see if the symptoms will go away. Do not drive yourself to the hospital. This information is not intended to replace advice given to you by your health care provider. Make sure you discuss any questions you have with your health care provider. Document Revised: 02/08/2022 Document Reviewed: 02/08/2022 Elsevier Patient Education  2024 Else[employer] Inc. Health Maintenance, Female Adopting a healthy lifestyle and getting preventive care are important in promoting health and wellness. Ask your health care provider about: The right schedule for you to have regular tests and exams. Things you can do on your own to prevent diseases and keep yourself healthy. What should I know about diet, weight, and exercise? Eat a healthy diet  Eat a diet that includes plenty of vegetables, fruits, low-fat dairy products, and lean protein. Do not eat a lot of foods that are high in solid fats, added sugars, or sodium. Maintain a healthy weight Body mass index (BMI) is used to identify weight problems. It estimates body fat based on height and weight. Your health care provider can help determine your BMI and help you achieve or maintain a healthy weight. Get regular exercise Get regular exercise. This is one of the most important things you can do for your health. Most  adults should: Exercise for at least 150 minutes each week. The exercise should increase your heart rate and make you sweat (moderate-intensity exercise). Do strengthening exercises at least twice a week. This is in addition to the moderate-intensity exercise. Spend less time sitting. Even light physical activity can be beneficial. Watch cholesterol and blood lipids Have your blood tested for lipids and cholesterol at 66 years of age, then have this test every 5 years. Have your cholesterol levels checked more often if: Your lipid or cholesterol levels are high. You are older than 66 years of age. You are at high risk for heart disease. What should I know about cancer screening? Depending on your health history and family history, you may need to have cancer screening at various ages. This may include  screening for: Breast cancer. Cervical cancer. Colorectal cancer. Skin cancer. Lung cancer. What should I know about heart disease, diabetes, and high blood pressure? Blood pressure and heart disease High blood pressure causes heart disease and increases the risk of stroke. This is more likely to develop in people who have high blood pressure readings or are overweight. Have your blood pressure checked: Every 3-5 years if you are 75-23 years of age. Every year if you are 24 years old or older. Diabetes Have regular diabetes screenings. This checks your fasting blood sugar level. Have the screening done: Once every three years after age 27 if you are at a normal weight and have a low risk for diabetes. More often and at a younger age if you are overweight or have a high risk for diabetes. What should I know about preventing infection? Hepatitis B If you have a higher risk for hepatitis B, you should be screened for this virus. Talk with your health care provider to find out if you are at risk for hepatitis B infection. Hepatitis C Testing is recommended for: Everyone born from [birth year]  through 1965. Anyone with known risk factors for hepatitis C. Sexually transmitted infections (STIs) Get screened for STIs, including gonorrhea and chlamydia, if: You are sexually active and are younger than 66 years of age. You are older than 66 years of age and your health care provider tells you that you are at risk for this type of infection. Your sexual activity has changed since you were last screened, and you are at increased risk for chlamydia or gonorrhea. Ask your health care provider if you are at risk. Ask your health care provider about whether you are at high risk for HIV. Your health care provider may recommend a prescription medicine to help prevent HIV infection. If you choose to take medicine to prevent HIV, you should first get tested for HIV. You should then be tested every 3 months for as long as you are taking the medicine. Pregnancy If you are about to stop having your period (premenopausal) and you may become pregnant, seek counseling before you get pregnant. Take 400 to 800 micrograms (mcg) of folic acid every day if you become pregnant. Ask for birth control (contraception) if you want to prevent pregnancy. Osteoporosis and menopause Osteoporosis is a disease in which the bones lose minerals and strength with aging. This can result in bone fractures. If you are 38 years old or older, or if you are at risk for osteoporosis and fractures, ask your health care provider if you should: Be screened for bone loss. Take a calcium or vitamin D  supplement to lower your risk of fractures. Be given hormone replacement therapy (HRT) to treat symptoms of menopause. Follow these instructions at home: Alcohol use Do not drink alcohol if: Your health care provider tells you not to drink. You are pregnant, may be pregnant, or are planning to become pregnant. If you drink alcohol: Limit how much you have to: 0-1 drink a day. Know how much alcohol is in your drink. In the [place] one  drink equals one 12 oz bottle of beer (355 mL), one 5 oz glass of wine (148 mL), or one 1 oz glass of hard liquor (44 mL). Lifestyle Do not use any products that contain nicotine or tobacco. These products include cigarettes, chewing tobacco, and vaping devices, such as e-cigarettes. If you need help quitting, ask your health care provider. Do not use street drugs. Do not share needles. Ask  your health care provider for help if you need support or information about quitting drugs. General instructions Schedule regular health, dental, and eye exams. Stay current with your vaccines. Tell your health care provider if: You often feel depressed. You have ever been abused or do not feel safe at home. Summary Adopting a healthy lifestyle and getting preventive care are important in promoting health and wellness. Follow your health care provider's instructions about healthy diet, exercising, and getting tested or screened for diseases. Follow your health care provider's instructions on monitoring your cholesterol and blood pressure. This information is not intended to replace advice given to you by your health care provider. Make sure you discuss any questions you have with your health care provider. Document Revised: 10/27/2020 Document Reviewed: 10/27/2020 Elsevier Patient Education  2024 Elsevier Inc. Hearing Loss Hearing loss is a partial or total loss of the ability to hear. This can be temporary or permanent, and it can happen in one or both ears. There are two types of hearing loss. You can have just one type or both types. You may have a problem with: Damage to your hearing nerves (sensorineural hearing loss). This type of hearing loss is more likely to be permanent. A hearing aid is often the best treatment. Sound getting to your inner ear (conductive hearing loss). This type of hearing loss can usually be treated medically or surgically. Medical care is necessary to treat hearing loss  properly and to prevent the condition from getting worse. Your hearing may partially or completely come back, depending on what caused your hearing loss and how severe it is. In some cases, hearing loss is permanent. What are the causes? Common causes of hearing loss include: Too much wax in the ear canal. Infection of the ear canal or middle ear. Fluid in the middle ear. Injury to the ear or surrounding area. An object stuck in the ear. A history of prolonged exposure to loud sounds, such as music. Less common causes of hearing loss include: Tumors in the ear. Viral or bacterial infections, such as meningitis. A hole in the eardrum (perforated eardrum). Problems with the hearing nerve that sends signals between the brain and the ear. Certain medicines. What are the signs or symptoms? Symptoms of this condition may include: Difficulty telling the difference between sounds. Difficulty following a conversation when there is background noise. Lack of response to sounds in your environment. This may be most noticeable when you do not respond to startling sounds. Needing to turn up the volume on the television, radio, or other devices. Ringing in the ears. Dizziness. How is this diagnosed? This condition is diagnosed based on: A physical exam. A hearing test (audiometry). The test will be performed by a hearing specialist (audiologist). Imaging tests, such as an MRI or CT scan. You may also be referred to an ear, nose, and throat (ENT) specialist (otolaryngologist). How is this treated? Treatment for hearing loss may include: Earwax removal. Medicines to treat or prevent infection (antibiotics). Medicines to reduce inflammation (corticosteroids). Hearing aids or assistive listening devices. Follow these instructions at home: If you were prescribed an antibiotic medicine, take it as told by your health care provider. Do not stop taking the antibiotic even if you start to feel  better. Take over-the-counter and prescription medicines only as told by your health care provider. Avoid loud noises. Use hearing protection if you are exposed to loud noises or work in a noisy environment. Return to your normal activities as told by  your health care provider. Ask your health care provider what activities are safe for you. Keep all follow-up visits. This is important. Contact a health care provider if: You feel dizzy. You develop new symptoms. You vomit or feel nauseous. You have a fever. Get help right away if: You develop sudden changes in your vision. You have severe ear pain. You have new or increased weakness. You have a severe headache. Summary Hearing loss is a decreased ability to hear sounds around you. It can be temporary or permanent. Treatment will depend on the cause of your hearing loss. It may include earwax removal, medicines, or a hearing aid. Your hearing may partially or completely come back, depending on what caused your hearing loss and how severe it is. Keep all follow-up visits. This is important. This information is not intended to replace advice given to you by your health care provider. Make sure you discuss any questions you have with your health care provider. Document Revised: 04/16/2021 Document Reviewed: 04/16/2021 Elsevier Patient Education  2024 [employer].

## 2024-05-01 DIAGNOSIS — M25552 Pain in left hip: Secondary | ICD-10-CM | POA: Diagnosis not present

## 2024-05-01 DIAGNOSIS — M25561 Pain in right knee: Secondary | ICD-10-CM | POA: Diagnosis not present

## 2024-05-01 DIAGNOSIS — M25551 Pain in right hip: Secondary | ICD-10-CM | POA: Diagnosis not present

## 2024-05-03 DIAGNOSIS — M25561 Pain in right knee: Secondary | ICD-10-CM | POA: Diagnosis not present

## 2024-05-03 DIAGNOSIS — M25551 Pain in right hip: Secondary | ICD-10-CM | POA: Diagnosis not present

## 2024-05-03 DIAGNOSIS — M25552 Pain in left hip: Secondary | ICD-10-CM | POA: Diagnosis not present

## 2024-05-08 DIAGNOSIS — M25552 Pain in left hip: Secondary | ICD-10-CM | POA: Diagnosis not present

## 2024-05-08 DIAGNOSIS — M25551 Pain in right hip: Secondary | ICD-10-CM | POA: Diagnosis not present

## 2024-05-08 DIAGNOSIS — M25561 Pain in right knee: Secondary | ICD-10-CM | POA: Diagnosis not present

## 2024-05-11 DIAGNOSIS — M25552 Pain in left hip: Secondary | ICD-10-CM | POA: Diagnosis not present

## 2024-05-11 DIAGNOSIS — M25561 Pain in right knee: Secondary | ICD-10-CM | POA: Diagnosis not present

## 2024-05-11 DIAGNOSIS — M25551 Pain in right hip: Secondary | ICD-10-CM | POA: Diagnosis not present

## 2024-05-15 DIAGNOSIS — M25561 Pain in right knee: Secondary | ICD-10-CM | POA: Diagnosis not present

## 2024-05-15 DIAGNOSIS — M25551 Pain in right hip: Secondary | ICD-10-CM | POA: Diagnosis not present

## 2024-05-15 DIAGNOSIS — M25552 Pain in left hip: Secondary | ICD-10-CM | POA: Diagnosis not present

## 2024-05-22 DIAGNOSIS — M25551 Pain in right hip: Secondary | ICD-10-CM | POA: Diagnosis not present

## 2024-05-22 DIAGNOSIS — M25552 Pain in left hip: Secondary | ICD-10-CM | POA: Diagnosis not present

## 2024-05-22 DIAGNOSIS — M25561 Pain in right knee: Secondary | ICD-10-CM | POA: Diagnosis not present

## 2024-05-25 DIAGNOSIS — M25561 Pain in right knee: Secondary | ICD-10-CM | POA: Diagnosis not present

## 2024-05-25 DIAGNOSIS — M25552 Pain in left hip: Secondary | ICD-10-CM | POA: Diagnosis not present

## 2024-05-25 DIAGNOSIS — M25551 Pain in right hip: Secondary | ICD-10-CM | POA: Diagnosis not present

## 2024-05-29 DIAGNOSIS — M25561 Pain in right knee: Secondary | ICD-10-CM | POA: Diagnosis not present

## 2024-05-29 DIAGNOSIS — M25551 Pain in right hip: Secondary | ICD-10-CM | POA: Diagnosis not present

## 2024-05-29 DIAGNOSIS — M25552 Pain in left hip: Secondary | ICD-10-CM | POA: Diagnosis not present

## 2024-05-31 NOTE — Progress Notes (Signed)
 Veronica Mills                                          MRN: [id number]   05/31/2024   The [facility] Quality Team Specialist reviewed this patient medical record for the purposes of chart review for care gap closure. The following were reviewed: chart review for care gap closure-breast cancer screening.    [facility] Quality Team

## 2024-06-01 DIAGNOSIS — M25551 Pain in right hip: Secondary | ICD-10-CM | POA: Diagnosis not present

## 2024-06-01 DIAGNOSIS — M25552 Pain in left hip: Secondary | ICD-10-CM | POA: Diagnosis not present

## 2024-06-01 DIAGNOSIS — M25561 Pain in right knee: Secondary | ICD-10-CM | POA: Diagnosis not present

## 2024-07-02 ENCOUNTER — Ambulatory Visit
Admission: RE | Admit: 2024-07-02 | Discharge: 2024-07-02 | Disposition: A | Discharge status: Home / Self Care | Source: Ambulatory Visit

## 2024-07-02 ENCOUNTER — Ambulatory Visit

## 2024-07-02 VITALS — BP 151/69 | HR 56 | Temp 98.1°F | Ht 64.0 in | Wt 176.4 lb

## 2024-07-02 DIAGNOSIS — E119 Type 2 diabetes mellitus without complications: Secondary | ICD-10-CM

## 2024-07-02 DIAGNOSIS — F32A Depression, unspecified: Secondary | ICD-10-CM | POA: Diagnosis not present

## 2024-07-02 DIAGNOSIS — M25561 Pain in right knee: Secondary | ICD-10-CM | POA: Insufficient documentation

## 2024-07-02 DIAGNOSIS — G8929 Other chronic pain: Secondary | ICD-10-CM

## 2024-07-02 MED ORDER — TRUEPLUS LANCETS 30G MISC: 1.0000 | Freq: Two times a day (BID) | 3 refills | Status: AC

## 2024-07-02 MED ORDER — TRUE METRIX PRO BLOOD GLUCOSE VI STRP: 1.0000 | ORAL_STRIP | Freq: Two times a day (BID) | 3 refills | Status: AC

## 2024-07-02 MED ORDER — TRUE METRIX AIR GLUCOSE METER W/DEVICE KIT: 1.0000 | PACK | Freq: Two times a day (BID) | 0 refills | Status: AC

## 2024-07-02 MED ORDER — BUPROPION HCL ER (XL) 300 MG PO TB24: 300.0000 mg | ORAL_TABLET | Freq: Every day | ORAL | 0 refills | Status: AC

## 2024-07-02 MED ORDER — CELECOXIB 100 MG PO CAPS: 100.0000 mg | ORAL_CAPSULE | Freq: Two times a day (BID) | ORAL | 0 refills | Status: AC

## 2024-07-02 NOTE — Progress Notes (Signed)
 "  BP (!) 151/69 (BP Location: Left Arm, Cuff Size: Normal)   Pulse (!) 56   Temp 98.1 F (36.7 C) (Oral)   Ht 5' 4 (1.626 m)   Wt 176 lb 6.4 oz (80 kg)   SpO2 98%   BMI 30.28 kg/m    Subjective:    Patient ID: Veronica Mills, female    DOB: 01/13/1956, 69 y.o.   MRN: [id number]  HPI: Veronica Mills is a 69 y.o. female c/o right knee pain x7 years with one initial injury and recurring instances of pain about 2-3 times since then with soreness for less than a month.  She takes meloxicam  15mg  with little relief.  Will try celebrex  again.  Order right knee xrays, possibly need for mri; seeing [facility];  Depression symptoms worsening, feeling agitated and like she has no interest in anything.  She has been referred for mental health counseling but referrals are denied due to medicare.  Chief Complaint  Patient presents with   Depression    Patient states she feels like her medication is not helping her much. States she doesn't feel like she wants to get up in the morning. States she would like to discuss increasing her dosage.    Knee Pain    Patient states she has been having R knee pain for a while now, over a year. States she has done PT, which made her knee worse. States she thinks she needs to have imaging. States the pain comes and goes. Thinks the pain came from when she hurt her knee about 7 years ago. States something popped out around her knee cap and has happened a couple of times since then.     Relevant past medical, surgical, family and social history reviewed and updated as indicated. Interim medical history since our last visit reviewed. Allergies and medications reviewed and updated.  Review of Systems  Constitutional:  Positive for activity change and fatigue. Negative for appetite change and fever.  HENT:  Negative for rhinorrhea, sinus pain and sore throat.   Respiratory:  Negative for cough, shortness of breath and wheezing.   Cardiovascular:  Negative  for chest pain, palpitations and leg swelling.  Gastrointestinal:  Negative for abdominal pain.  Musculoskeletal:  Positive for arthralgias, gait problem and joint swelling.  Neurological:  Positive for headaches.    Per HPI unless specifically indicated above     Objective:    BP (!) 151/69 (BP Location: Left Arm, Cuff Size: Normal)   Pulse (!) 56   Temp 98.1 F (36.7 C) (Oral)   Ht 5' 4 (1.626 m)   Wt 176 lb 6.4 oz (80 kg)   SpO2 98%   BMI 30.28 kg/m   Wt Readings from Last 3 Encounters:  07/02/24 176 lb 6.4 oz (80 kg)  04/23/24 173 lb (78.5 kg)  04/18/24 173 lb 9.6 oz (78.7 kg)    Physical Exam Constitutional:      Appearance: Normal appearance. She is normal weight.  HENT:     Nose: Nose normal. No congestion or rhinorrhea.  Cardiovascular:     Rate and Rhythm: Normal rate and regular rhythm.  Pulmonary:     Effort: Pulmonary effort is normal.     Breath sounds: Normal breath sounds.  Musculoskeletal:        General: Swelling, tenderness and signs of injury present. No deformity.  Skin:    General: Skin is warm and dry.  Neurological:     General: No  focal deficit present.     Mental Status: She is alert.  Psychiatric:        Mood and Affect: Mood is depressed. Mood is not anxious.     07/02/2024    PHQ2-9 Depression Screening   Little interest or pleasure in doing things Nearly every day  Feeling down, depressed, or hopeless Nearly every day  PHQ-2 - Total Score 6  Trouble falling or staying asleep, or sleeping too much Nearly every day  Feeling tired or having little energy Nearly every day  Poor appetite or overeating  Nearly every day  Feeling bad about yourself - or that you are a failure or have let yourself or your family down Not at all  Trouble concentrating on things, such as reading the newspaper or watching television More than half the days  Moving or speaking so slowly that other people could have noticed.  Or the opposite - being so fidgety  or restless that you have been moving around a lot more than usual Not at all  Thoughts that you would be better off dead, or hurting yourself in some way Not at all  PHQ2-9 Total Score 17  If you checked off any problems, how difficult have these problems made it for you to do your work, take care of things at home, or get along with other people Very difficult  Depression Interventions/Treatment Medication, Currently on Treatment     Results for orders placed or performed in visit on 04/18/24  HgB A1c   Collection Time: 04/18/24  8:59 AM  Result Value Ref Range   Hgb A1c MFr Bld 6.1 (H) 4.8 - 5.6 %   Est. average glucose Bld gHb Est-mCnc 128 mg/dL  Basic metabolic panel with GFR   Collection Time: 04/18/24  8:59 AM  Result Value Ref Range   Glucose 119 (H) 70 - 99 mg/dL   BUN 16 8 - 27 mg/dL   Creatinine, Ser 9.27 0.57 - 1.00 mg/dL   eGFR 92 >40 fO/fpw/8.26   BUN/Creatinine Ratio 22 12 - 28   Sodium 138 134 - 144 mmol/L   Potassium 4.5 3.5 - 5.2 mmol/L   Chloride 101 96 - 106 mmol/L   CO2 23 20 - 29 mmol/L   Calcium 10.1 8.7 - 10.3 mg/dL  Lipid Panel w/o Chol/HDL Ratio   Collection Time: 04/18/24  8:59 AM  Result Value Ref Range   Cholesterol, Total 220 (H) 100 - 199 mg/dL   Triglycerides 872 0 - 149 mg/dL   HDL 61 >60 mg/dL   VLDL Cholesterol Cal 22 5 - 40 mg/dL   LDL Chol Calc (NIH) 862 (H) 0 - 99 mg/dL      Assessment & Plan:   Assessment & Plan Chronic pain of right knee Injury 7 years ago with no imaging performed.  Xray ordered, will likely need other imaging.  Will try Celebrex  again, rx sent. Orders:   celecoxib  (CELEBREX ) 100 MG capsule; Take 1 capsule (100 mg total) by mouth 2 (two) times daily.   DG Knee Complete 4 Views Right; Future  Depression, unspecified depression type PHQ 9 score 17 - moderately severe depression.  Taking Bupropion  XR 150 mg will increase to 300 mg.  Has been referred to psychiatry multiple times and told they do not take her  medicare.  Will try to send new referral. Rx sent. Orders:   buPROPion  (WELLBUTRIN  XL) 300 MG 24 hr tablet; Take 1 tablet (300 mg total) by mouth daily.  Ambulatory referral to Psychiatry  Diabetes mellitus without complication (HCC) New monitor, test strips, and lancets needed. Rx sent. Orders:   Blood Glucose Monitoring Suppl (TRUE METRIX AIR GLUCOSE METER) w/Device KIT; 1 each by Does not apply route 2 (two) times daily.   glucose blood (TRUE METRIX PRO BLOOD GLUCOSE) test strip; 1 each by Other route 2 (two) times daily. Use as instructed   TRUEplus Lancets 30G MISC; 1 each by Does not apply route 2 (two) times daily.   Follow up plan: Return in about 4 weeks (around 07/30/2024) for mood follow up and knee follow up.      "

## 2024-07-09 ENCOUNTER — Ambulatory Visit: Payer: Self-pay

## 2024-07-09 DIAGNOSIS — G8929 Other chronic pain: Secondary | ICD-10-CM

## 2024-07-09 NOTE — Progress Notes (Signed)
 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  Please let us  know if you need a new referral to orthopedics or physical therapy and if we need to start the process of further imaging. Thank you! Jenn, NP

## 2024-07-31 ENCOUNTER — Ambulatory Visit: Admitting: Nurse Practitioner

## 2024-10-18 ENCOUNTER — Encounter: Admitting: Nurse Practitioner
